# Patient Record
Sex: Male | Born: 1950 | Race: White | Hispanic: No | Marital: Married | Smoking: Never smoker
Health system: Southern US, Community
[De-identification: ages and names within clinical notes are randomized; demographics above are authoritative.]

## PROBLEM LIST (undated history)

## (undated) DIAGNOSIS — I10 Essential (primary) hypertension: Secondary | ICD-10-CM

## (undated) DIAGNOSIS — I2510 Atherosclerotic heart disease of native coronary artery without angina pectoris: Secondary | ICD-10-CM

## (undated) DIAGNOSIS — G20 Parkinson's disease: Secondary | ICD-10-CM

## (undated) DIAGNOSIS — G20A1 Parkinson's disease without dyskinesia, without mention of fluctuations: Secondary | ICD-10-CM

## (undated) HISTORY — DX: Atherosclerotic heart disease of native coronary artery without angina pectoris: I25.10

## (undated) HISTORY — PX: BACK SURGERY: SHX140

## (undated) HISTORY — PX: HERNIA REPAIR: SHX51

---

## 2000-11-17 ENCOUNTER — Emergency Department (HOSPITAL_COMMUNITY): Admission: EM | Admit: 2000-11-17 | Discharge: 2000-11-17 | Payer: Self-pay | Admitting: Emergency Medicine

## 2006-04-20 ENCOUNTER — Encounter: Admission: RE | Admit: 2006-04-20 | Discharge: 2006-04-20 | Payer: Self-pay | Admitting: Family Medicine

## 2006-04-20 IMAGING — CR DG CHEST 2V
2 series · 2 of 2 positions shown · non-contrast
Comparison: None.

CLINICAL DATA: Parkinson?s disease with right-sided chest pain for two months.
 CHEST - 2 VIEW:

[view not recorded (1 of 2)]
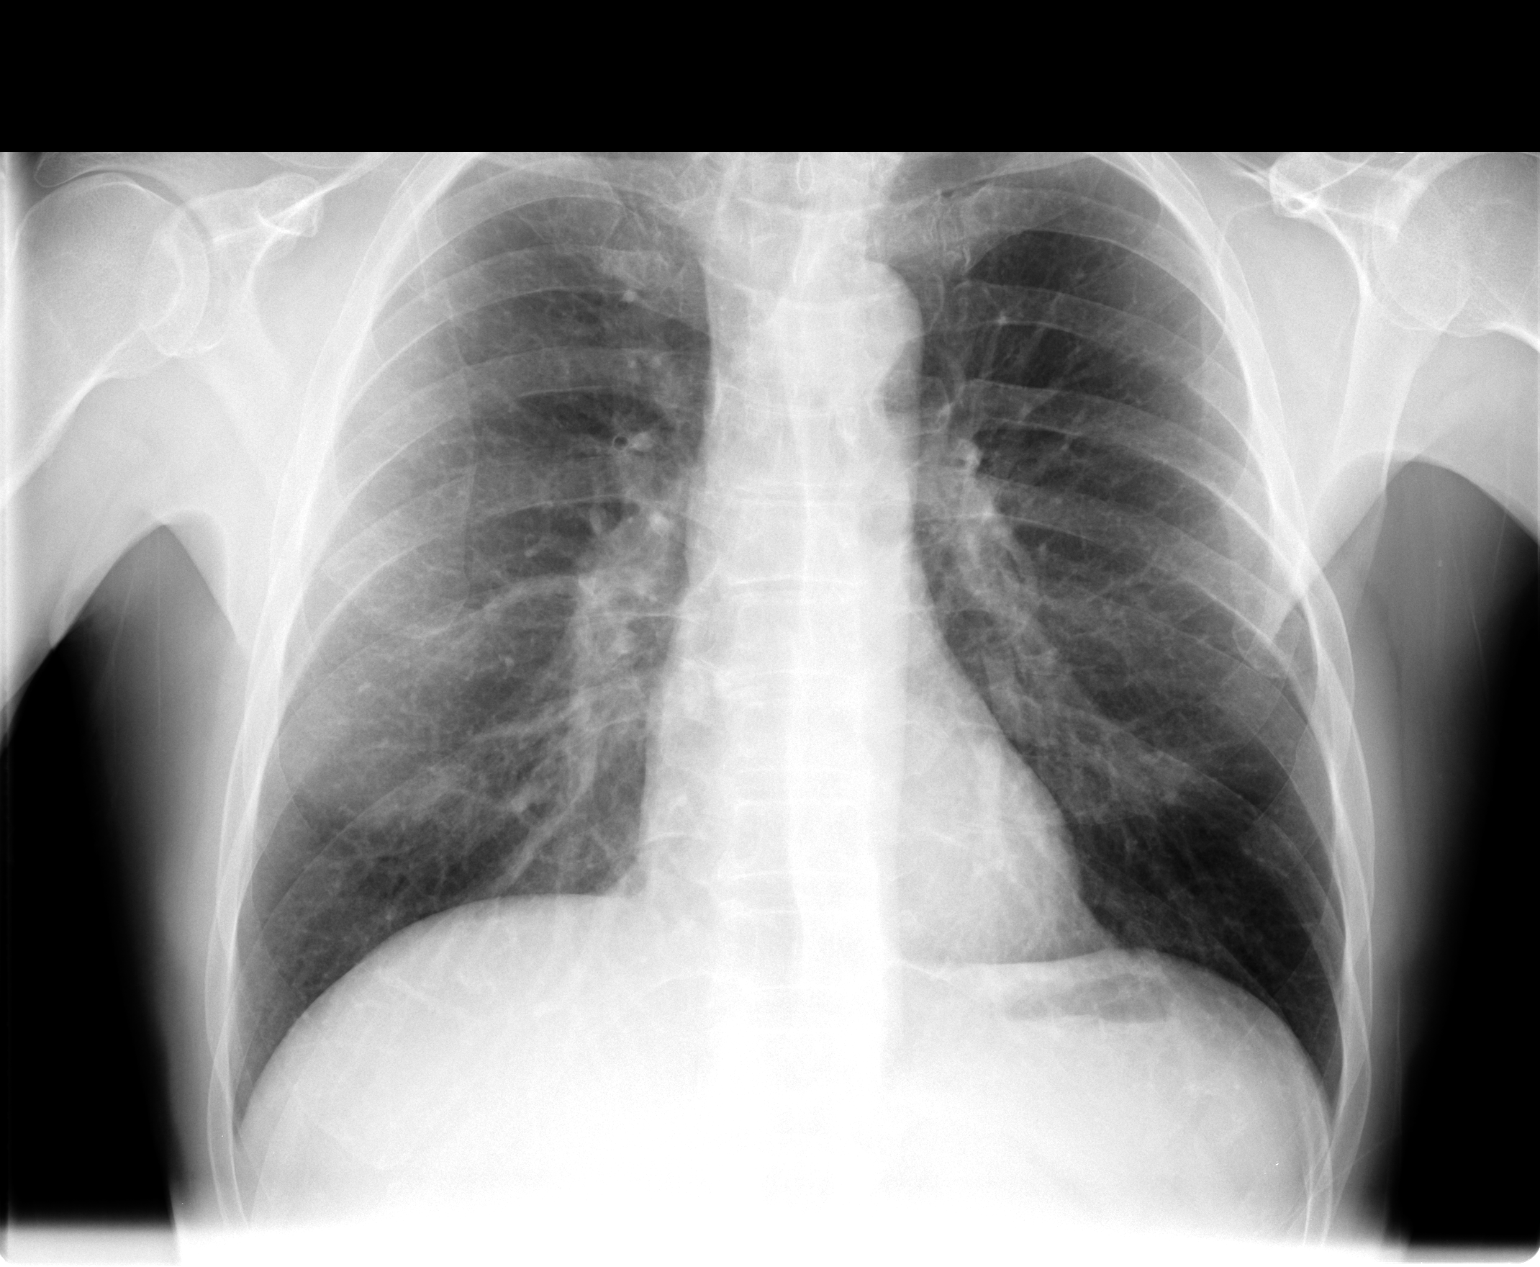

[view not recorded (2 of 2)]
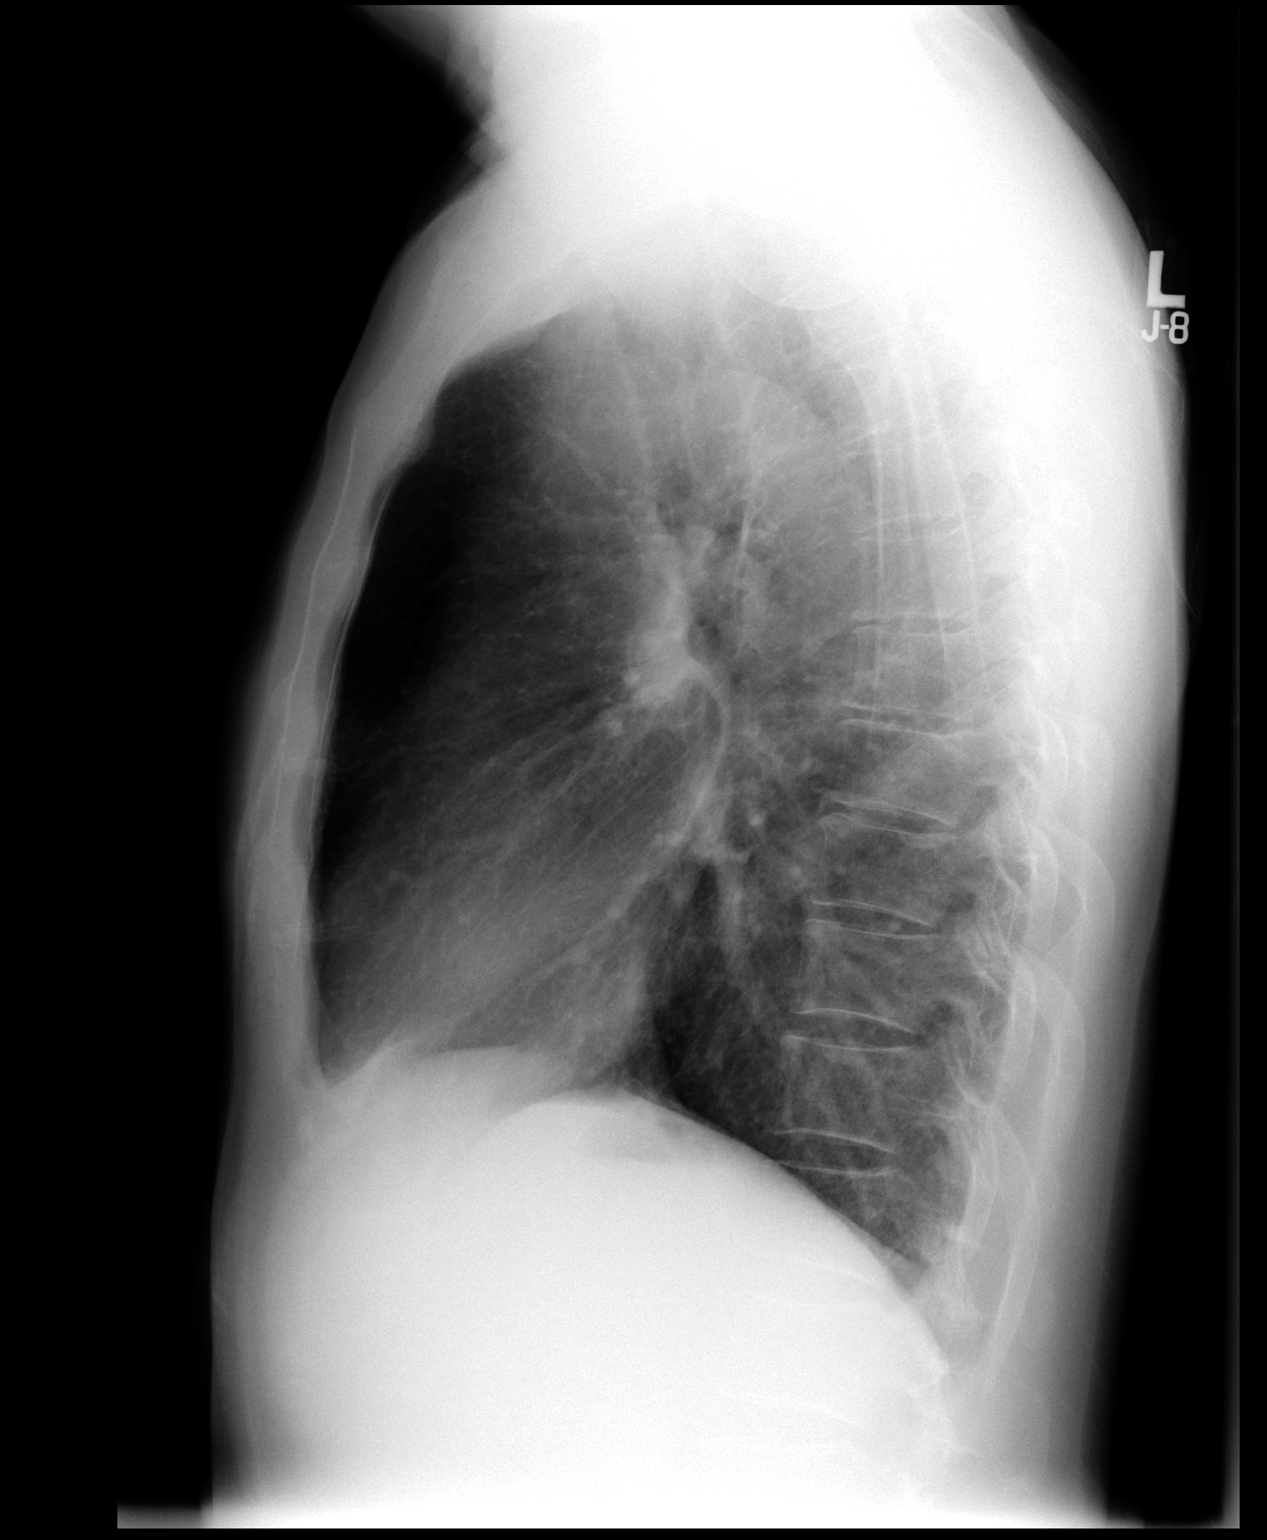

[2 of 2 positions shown; findings below may reference images not displayed]

FINDINGS: Midline trachea. The heart size is normal.   Mediastinal contours are within normal limits.  The costophrenic angles are sharp. The lungs are clear.
IMPRESSION: No acute cardiopulmonary disease.

## 2007-01-22 ENCOUNTER — Ambulatory Visit (HOSPITAL_BASED_OUTPATIENT_CLINIC_OR_DEPARTMENT_OTHER): Admission: RE | Admit: 2007-01-22 | Discharge: 2007-01-22 | Payer: Self-pay | Admitting: Surgery

## 2010-06-06 LAB — SURGICAL PCR SCREEN
MRSA, PCR: NEGATIVE
Staphylococcus aureus: NEGATIVE

## 2010-06-06 LAB — BASIC METABOLIC PANEL
BUN: 16 mg/dL (ref 6–23)
CO2: 29 mEq/L (ref 19–32)
Calcium: 9.8 mg/dL (ref 8.4–10.5)
Chloride: 105 mEq/L (ref 96–112)
Creatinine, Ser: 0.72 mg/dL (ref 0.4–1.5)
GFR calc Af Amer: >60 mL/min (ref >60)
GFR calc non Af Amer: >60 mL/min (ref >60)
Glucose, Bld: 99 mg/dL (ref 70–99)
Potassium: 3.9 mEq/L (ref 3.5–5.1)
Sodium: 141 mEq/L (ref 135–145)

## 2010-06-08 ENCOUNTER — Ambulatory Visit (HOSPITAL_COMMUNITY)
Admission: RE | Admit: 2010-06-08 | Discharge: 2010-06-08 | Payer: Self-pay | Source: Home / Self Care | Attending: Surgery | Admitting: Surgery

## 2010-06-17 NOTE — Op Note (Signed)
Jack Cobb, KISSNER              ACCOUNT NO.:  [id number]  MEDICAL RECORD NO.:  [id number]          PATIENT TYPE:  AMB  LOCATION:  DAY                          FACILITY:  [facility]  PHYSICIAN:  Thornton Park. Daphine Deutscher, MD  DATE OF BIRTH:  [birth year]  DATE OF PROCEDURE:  06/08/2010 DATE OF DISCHARGE:                              OPERATIVE REPORT   PREOPERATIVE DIAGNOSIS:  Recurrent right inguinal hernia.  POSTOPERATIVE DIAGNOSIS:  [place] recurrent hernia status post laparoscopic placement with C-QUR FX mesh.  Laparoscopic right inguinal hernia repair.  SURGEON:  Luretha Murphy, M.D.  ASSISTANT:  None.  ANESTHESIA:  General endotracheal.  DESCRIPTION OF PROCEDURE:  Angelos Wasco was taken to room 1, and given general anesthesia.  The abdomen was clipped and then prepped with PCMX and draped sterilely.  Previously, I had marked an X in the right groin, and this was visible.  We went below his umbilicus to the right, made a transverse incision.  I incised the rectus sheath transversely and retracted the muscle laterally.  I then dissected with my finger and then inserted the balloon dissector down to the pubis.  This blew up nicely preferentially and dissecting the right side.  Balloon was removed.  The preperitoneal space was then dissected and then expanded. Two 5-mm were placed in this preperitoneal space under direct vision.  I then began the dissection of the right inguinal region.  I could see one of my running Prolene sutures in the inguinal ligament, where the other mesh had been placed.  Anteriorly, there was a bulge, consistent with a direct hernia, and I think this is where the previously placed Ultra Pro II mesh had probably given way, as it was bulging that corresponded to the area in question.  However, I went ahead and wanted to make sure there was not a recurrent indirect or a new indirect as well.  In dissecting this, I found that there was.  I actually entered  the peritoneal space a little bit laterally.  I then swept all this down, but found the hernia sac and peeled it off the cord structures.  Using the aforementioned mesh, which enabled me to insert and tack with the secure strap absorbable tacker, I was able to come around the cord structures and secure the mesh to itself, so that the cord structures were completely reinforced.  Medially, this was tacked along the pubis and anteriorly, and I got a good repair, both around the direct hernia as well as around the cord structures.  There was really no bleeding.  Everything looked good.  I did put in a Veress needle to decompress the abdomen, because it did get a little of the gas that went into his peritoneal cavity through the rent in the indirect sac.  Wounds were injected with Marcaine and were closed with 4- 0 Vicryl.  Patient was taken to recovery room in satisfactory addition. He will be given some Percocet 5/325 to take for pain.     Thornton Park Daphine Deutscher, MD     MBM/MEDQ  D:  06/08/2010  T:  06/08/2010  Job:  [id number] Electronically Signed by  Luretha Murphy MD on 06/17/2010 08:30:39 PM

## 2010-10-04 NOTE — Op Note (Signed)
NAMEBECKHEM, ISADORE              ACCOUNT NO.:  [id number]   MEDICAL RECORD NO.:  [id number]          PATIENT TYPE:  AMB   LOCATION:  DSC                          FACILITY:  [facility]   PHYSICIAN:  Thornton Park. Daphine Deutscher, MD  DATE OF BIRTH:  10/11/1950   DATE OF PROCEDURE:  DATE OF DISCHARGE:                               OPERATIVE REPORT   PREOPERATIVE DIAGNOSIS:  Right inguinal hernia.   POSTOPERATIVE DIAGNOSIS:  Right direct inguinal hernia.   PROCEDURE:  Right inguinal hernia repair using Ultrapro mesh.   SURGEON:  Luretha Murphy.   ASSISTANT:  None.   ANESTHESIA:  General.   DESCRIPTION OF PROCEDURE:  Mr. Ditullio is a 55-year white male with a  soft bulge in his right inguinal region, easily reducible.  He was taken  to room 3 at [facility] and given general.  The abdomen was  clipped and prepped with Techni-Care and draped sterilely.  A small  oblique area incision was made and carried down to the external oblique  which were then incised.  Medial and lateral flaps were developed.  Ilioinguinal nerve branch was identified and reflected with the medial  flap.  I mobilized the cord and explored it proximally; did not find an  indirect hernia but found a fairly obvious big bulge in the floor.  I  cut this laxity, pushing the preperitoneal fat back, and then did a  Shouldice-type overlap repair with 2-0 Prolene.   Next, I cut a piece of Ultrapro mesh by [employer], which is a  polypropylene and a dissolvable component as well, and sutured that on  to the inguinal ligament with running 2-0 Prolene; medially to the  fascia with running 2-0 Prolene.  Then, I cut it and brought it around  the cord itself and tucked it beneath the external oblique.  I avoided  any impingement on the nerves and then closed the external oblique with  running 2-0 Vicryl.  4-0 Vicryl was used in the subcutaneous tissue and  subcuticular.  The skin was closed with Dermabond.  The patient seemed  to  tolerate the procedure well and was taken to recovery room in  satisfactory condition.  He will be given Tylox to take for pain and  will be followed up in the office 3-4 weeks.      Thornton Park Daphine Deutscher, MD  Electronically Signed     MBM/MEDQ  D:  01/22/2007  T:  01/22/2007  Job:  [id number]   cc:   Aida Puffer

## 2011-03-03 LAB — POCT HEMOGLOBIN-HEMACUE
Hemoglobin: 15.3
Operator id: 116011

## 2012-11-16 ENCOUNTER — Other Ambulatory Visit: Payer: Self-pay

## 2012-11-16 MED ORDER — PRAMIPEXOLE DIHYDROCHLORIDE ER 2.25 MG PO TB24: 2.2500 mg | ORAL_TABLET | Freq: Every day | ORAL | Status: DC

## 2012-11-16 NOTE — Telephone Encounter (Signed)
Former [facility] patient assigned to Dr Athar.  

## 2012-12-10 ENCOUNTER — Emergency Department (HOSPITAL_COMMUNITY)
Admission: EM | Admit: 2012-12-10 | Discharge: 2012-12-10 | Disposition: A | Discharge status: Home / Self Care | Payer: Medicare Other | Attending: Emergency Medicine | Admitting: Emergency Medicine

## 2012-12-10 ENCOUNTER — Encounter (HOSPITAL_COMMUNITY): Payer: Self-pay

## 2012-12-10 DIAGNOSIS — G20 Parkinson's disease: Secondary | ICD-10-CM | POA: Insufficient documentation

## 2012-12-10 DIAGNOSIS — G20A1 Parkinson's disease without dyskinesia, without mention of fluctuations: Secondary | ICD-10-CM | POA: Insufficient documentation

## 2012-12-10 DIAGNOSIS — S0501XA Injury of conjunctiva and corneal abrasion without foreign body, right eye, initial encounter: Secondary | ICD-10-CM

## 2012-12-10 DIAGNOSIS — Z79899 Other long term (current) drug therapy: Secondary | ICD-10-CM | POA: Insufficient documentation

## 2012-12-10 DIAGNOSIS — I10 Essential (primary) hypertension: Secondary | ICD-10-CM | POA: Insufficient documentation

## 2012-12-10 DIAGNOSIS — S058X9A Other injuries of unspecified eye and orbit, initial encounter: Secondary | ICD-10-CM | POA: Insufficient documentation

## 2012-12-10 DIAGNOSIS — Y9389 Activity, other specified: Secondary | ICD-10-CM | POA: Insufficient documentation

## 2012-12-10 DIAGNOSIS — Y929 Unspecified place or not applicable: Secondary | ICD-10-CM | POA: Insufficient documentation

## 2012-12-10 DIAGNOSIS — T1590XA Foreign body on external eye, part unspecified, unspecified eye, initial encounter: Secondary | ICD-10-CM | POA: Insufficient documentation

## 2012-12-10 HISTORY — DX: Parkinson's disease without dyskinesia, without mention of fluctuations: G20.A1

## 2012-12-10 HISTORY — DX: Essential (primary) hypertension: I10

## 2012-12-10 HISTORY — DX: Parkinson's disease: G20

## 2012-12-10 MED ORDER — NAPROXEN 500 MG PO TABS: 500.0000 mg | ORAL_TABLET | Freq: Two times a day (BID) | ORAL | Status: DC

## 2012-12-10 MED ORDER — CIPROFLOXACIN HCL 0.3 % OP SOLN: 1.0000 [drp] | OPHTHALMIC | Status: DC

## 2012-12-10 MED ORDER — OXYCODONE-ACETAMINOPHEN 5-325 MG PO TABS: 1.0000 | ORAL_TABLET | Freq: Three times a day (TID) | ORAL | Status: DC | PRN

## 2012-12-10 MED ORDER — TETRACAINE HCL 0.5 % OP SOLN
1.0000 [drp] | Freq: Once | OPHTHALMIC | Status: AC
  Administered 2012-12-10: 1 [drp] via OPHTHALMIC
  Filled 2012-12-10: qty 2

## 2012-12-10 MED ORDER — FLUORESCEIN SODIUM 1 MG OP STRP
1.0000 | ORAL_STRIP | Freq: Once | OPHTHALMIC | Status: AC
  Administered 2012-12-10: 1 via OPHTHALMIC
  Filled 2012-12-10: qty 1

## 2012-12-10 MED ORDER — OXYCODONE-ACETAMINOPHEN 5-325 MG PO TABS: 2.0000 | ORAL_TABLET | Freq: Three times a day (TID) | ORAL | Status: DC | PRN

## 2012-12-10 NOTE — ED Provider Notes (Signed)
History  This chart was scribed for Glade Nurse, PA-C working with Enid Skeens, MD by Greggory Stallion, ED scribe. This patient was seen in room TR04C/TR04C and the patient's care was started at 9:37 PM.  CSN: [id number] Arrival date & time 12/10/12  2111   Chief Complaint  Patient presents with  . Foreign Body in Eye   The history is provided by the patient. No language interpreter was used.    HPI Comments: Jack Cobb is a 62 y.o. male with PMHx of [place] and HTN who presents to the Emergency Department complaining right eye pain. Acute onset just a few hours ago when he was cutting wood with a band saw without safety glasses and had wood chips fly up into his right eye. Pt states he washed his eye out in the shower for about 20 minutes and then used eye drops with no relief. Pt describes pain as constant, throbbing, worsening from 2/10 to 5/10.  Admits redness and tearing, difficulty opening eye at times. Pt denies photophobia, painful EOM, visual disturbances, or loss of vision. Pt still appreciates foreign body sensation.   Past Medical History  Diagnosis Date  . Parkinson disease   . Hypertension    Past Surgical History  Procedure Laterality Date  . Hernia repair     History reviewed. No pertinent family history. History  Substance Use Topics  . Smoking status: Never Smoker   . Smokeless tobacco: Not on file  . Alcohol Use: No    Review of Systems  Constitutional: Negative for fever and diaphoresis.  HENT: Negative for neck pain and neck stiffness.   Eyes: Positive for pain, discharge (clear) and redness. Negative for visual disturbance.  Respiratory: Negative for apnea, chest tightness and shortness of breath.   Cardiovascular: Negative for chest pain and palpitations.  Gastrointestinal: Negative for nausea, vomiting, diarrhea and constipation.  Genitourinary: Negative for dysuria.  Musculoskeletal: Negative for gait problem.  Skin: Negative for rash.   Neurological: Negative for dizziness, weakness, light-headedness, numbness and headaches.    Allergies  Review of patient's allergies indicates no known allergies.  Home Medications   Current Outpatient Rx  Name  Route  Sig  Dispense  Refill  . Pramipexole Dihydrochloride (MIRAPEX ER) 2.25 MG TB24   Oral   Take 2.25 mg by mouth daily.   90 tablet   1    BP 139/75  Pulse 77  Temp(Src) 98.1 F (36.7 C) (Oral)  Resp 18  SpO2 97%  Physical Exam  Nursing note and vitals reviewed. Constitutional: He is oriented to person, place, and time. He appears well-developed and well-nourished. No distress.  HENT:  Head: Normocephalic and atraumatic.  Eyes: Conjunctivae and EOM are normal. Pupils are equal, round, and reactive to light. No foreign bodies found. Right eye exhibits discharge. No foreign body present in the right eye. Left eye exhibits discharge. No foreign body present in the left eye. Right conjunctiva is not injected. Left conjunctiva is not injected.  Slit lamp exam:      The right eye shows corneal abrasion and fluorescein uptake. The right eye shows no foreign body.    Fluorescein stain uptake at 3 o'clock and 7 o'clock on iris of right eye. Sclera injected. No foreign body visualized. Clear discharge/tearing. Visual Acuity  -  Bilateral Near:  20/50 ; R Near:  20/50 (pt states vision in right eye is blurred.) ; L Near:  20/50. Pt was given hand chart to read by nursing, pt  states he wears reading glasses which he did not have with him.   Neck: Normal range of motion. Neck supple.  No meningeal signs  Cardiovascular: Normal rate, regular rhythm and normal heart sounds.  Exam reveals no gallop and no friction rub.   No murmur heard. Pulmonary/Chest: Effort normal and breath sounds normal. No respiratory distress. He has no wheezes. He has no rales. He exhibits no tenderness.  Abdominal: Soft. Bowel sounds are normal. He exhibits no distension. There is no tenderness. There  is no rebound and no guarding.  Musculoskeletal: Normal range of motion. He exhibits no edema and no tenderness.  Neurological: He is alert and oriented to person, place, and time. No cranial nerve deficit.  Skin: Skin is warm and dry. He is not diaphoretic. No erythema.    ED Course  Procedures (including critical care time) DIAGNOSTIC STUDIES: Oxygen Saturation is 97% on RA, normal by my interpretation.    COORDINATION OF CARE: 9:52 PM-Discussed treatment plan which includes treatment for corneal abrasion with pt at bedside and pt agreed to plan. Advised pt he will be give antibiotics. Advised pt to follow up with an opthalmology.  Labs Reviewed - No data to display No results found. 1. Right corneal abrasion, initial encounter     MDM  Pt with corneal abrasion on PE. Eye irrigated w NS, no evidence of FB.  Difficult to assess visual acuity as pt does wear reading glasses but did not have them with him. Visual Acuity  -  Bilateral Near:  20/50 ; R Near:  20/50 (pt states vision in right eye is blurred.) ; L Near:  20/50.  Fluorescein stain uptake at 3 o'clock and 7 o'clock on iris of right eye. Sclera injected. No foreign body visualized. Clear discharge/tearing. Pt is not a contact lens wearer.  Exam non-concerning for orbital cellulitis, hyphema. Pt will be discharged home with ciloxan and pain meds.   Patient understands to follow up with ophthalmology, & to return to ER if new symptoms develop including change in vision, purulent drainage, or entrapment.   I personally performed the services described in this documentation, which was scribed in my presence. The recorded information has been reviewed and is accurate.    Glade Nurse, PA-C 12/10/12 2359

## 2012-12-10 NOTE — ED Notes (Addendum)
Pt reports he had wood chips fly up into his Right eye while cutting wood with a band saw at approx 1530 today. Pt can see out of eye, eye is red and draining, pt reports trying to flush his eye at home w/no relief, painful to open his eye

## 2012-12-12 ENCOUNTER — Ambulatory Visit (INDEPENDENT_AMBULATORY_CARE_PROVIDER_SITE_OTHER): Payer: Medicare Other | Admitting: Neurology

## 2012-12-12 ENCOUNTER — Encounter: Payer: Self-pay | Admitting: Neurology

## 2012-12-12 VITALS — BP 130/82 | HR 71 | Ht 70.0 in | Wt 153.0 lb

## 2012-12-12 DIAGNOSIS — G20 Parkinson's disease: Secondary | ICD-10-CM | POA: Insufficient documentation

## 2012-12-12 NOTE — Progress Notes (Signed)
Subjective:    Patient ID: Jack Cobb is a 62 y.o. male.  HPI  Interim history:   Jack Cobb is a very pleasant 62 year old right-handed gentleman who presents for followup consultation of his Parkinson's disease. He is unaccompanied today. He previously followed by Dr. Fayrene Fearing love and was last seen by him on 06/14/2012, and which time Dr. Sandria Manly felt that he was doing very well. He did not make any changes to his medication regimen. He has an underlying medical history of hypertension and chest pain with negative cardiological workup. He is status post hernia repair in 2012. He's currently on minocycline, Mirapex ER, carbidopa levodopa half a tablet 3 times a day and Diovan.   I reviewed Dr. 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 He exercises regularly. He has had some daytime somnolence. He snores at night. In February 2013 he went on disability. He denies compulsive behavior, orthostatic hypotension or edema. He has had some difficulty with his memory. His mother has dementia at age 80.  He lives alone, but takes care of his mother, who stays with him one week and with his only brother one week. He stays active and tends to sleep better when his mother stays with his brother as he can rest more and relax more. He has had more back pain and his R foot is stiffer, he feels a little more fatigued, but attributes this to the heat. He has a follow appointment with his cardiologist tomorrow. He had seen him one time for chest pain.   He denies depression, anxiety, hallucinations, delusions or major memory problems, or dyskinesias.   His Past Medical History Is Significant For: Past Medical History  Diagnosis Date  . Parkinson disease   . Hypertension      His Past Surgical History Is Significant For: Past Surgical History  Procedure Laterality Date  . Hernia repair      His Family History Is Significant For: No family history on file.  His Social History Is Significant For: History   Social History  . Marital Status: Single    Spouse Name: N/A    Number of Children: N/A  . Years of Education: N/A   Social History Main Topics  . Smoking status: Never Smoker   . Smokeless tobacco: None  . Alcohol Use: No  . Drug Use: No  . Sexually Active: None   Other Topics Concern  . None   Social History Narrative  . None    His Allergies Are:  No Known Allergies:   His Current Medications Are:  Outpatient Encounter Prescriptions as of 12/12/2012  Medication Sig Dispense Refill  . carbidopa-levodopa (SINEMET IR) 25-100 MG per tablet Take 1 tablet by mouth daily.      . Pramipexole Dihydrochloride (MIRAPEX ER) 2.25 MG TB24 Take 2.25 mg by mouth daily.  90 tablet  1  . rasagiline (AZILECT) 0.5 MG TABS Take 0.5 mg by mouth daily.      . valsartan (DIOVAN) 80 MG tablet Take 80 mg by mouth daily.      . [DISCONTINUED] ciprofloxacin (CILOXAN) 0.3 % ophthalmic solution Apply 1 drop to eye every 4 (four) hours. Place one drop in effected eye every 4 hours until follow up with opthalmologist  2.5 mL  1  . [DISCONTINUED] naproxen (  NAPROSYN) 500 MG tablet Take 1 tablet (500 mg total) by mouth 2 (two) times daily with a meal.  30 tablet  0  . [DISCONTINUED] oxyCODONE-acetaminophen (PERCOCET/ROXICET) 5-325 MG per tablet Take 1 tablet by mouth every 8 (eight) hours as needed for pain.  6 tablet  0   No facility-administered encounter medications on file as of 12/12/2012.  : Review of Systems  Constitutional: Positive for fatigue and unexpected weight change.    Objective:  Neurologic Exam  Physical Exam Physical Examination:   Filed Vitals:   12/12/12 0920  BP: 130/82  Pulse: 71    General Examination: The patient is a very  pleasant 62 y.o. male in no acute distress.  HEENT: Normocephalic, atraumatic, pupils are equal, round and reactive to light and accommodation. Funduscopic exam is normal with sharp disc margins noted. Extraocular tracking shows mild saccadic breakdown without nystagmus noted. There is limitation to upper gaze. There is mild decrease in eye blink rate. Hearing is intact. Tympanic membranes are clear bilaterally. Face is symmetric with mild facial masking and normal facial sensation. There is no lip, neck or jaw tremor. Neck is moderately rigid with intact passive ROM. There are no carotid bruits on auscultation. Oropharynx exam reveals mild mouth dryness. No significant airway crowding is noted. Mallampati is class II. Tongue protrudes centrally and palate elevates symmetrically.   There is no drooling.   Chest: is clear to auscultation without wheezing, rhonchi or crackles noted.  Heart: sounds are regular and normal without murmurs, rubs or gallops noted.   Abdomen: is soft, non-tender and non-distended with normal bowel sounds appreciated on auscultation.  Extremities: There is no pitting edema in the distal lower extremities bilaterally. Pedal pulses are intact.   Skin: is warm and dry with no trophic changes noted.    Musculoskeletal: exam reveals no obvious joint deformities, tenderness, joint swelling or erythema.  Neurologically:  Mental status: The patient is awake and alert, paying good  attention. He is able to completely provide the history. He is oriented to: person, place, time/date, situation, day of week, month of year and year. His memory, attention, language and knowledge are intact. There is no aphasia, agnosia, apraxia or anomia. There is noree of bradyphrenia. Speech is mildly hypophonic with no dysarthria noted. Mood is congruent and affect is normal.    Cranial nerves are as described above under HEENT exam. In addition, shoulder shrug is normal with equal shoulder height  noted.  Motor exam: Normal bulk, and strength for age is noted. There are no dyskinesias noted.   Tone is mildly rigid with presence of cogwheeling in the right upper extremity. There is overall mild bradykinesia. There is no drift or rebound.  There is no tremor.  Romberg is negative.  Reflexes are 1+ in the upper extremities and 1+ in the lower extremities.   Fine motor skills exam: Finger taps are moderately impaired on the right and mildly impaired on the left. Hand movements are moderately impaired on the right and mildly impaired on the left. RAP (rapid alternating patting) is moderately impaired on the right and mildly impaired on the left. Foot taps are moderately impaired on the right and moderately impaired on the left. Foot agility (in the form of heel stomping) is moderately impaired on the right and moderately impaired on the left.    Cerebellar testing shows no dysmetria or intention tremor on finger to nose testing. Heel to shin is unremarkable bilaterally. There is no truncal or gait ataxia.  Sensory exam is intact to light touch.  Gait, station and balance: He stands up from the seated position with minimal difficulty and does not need to push up with His hands. He needs no assistance. No veering to one side is noted. He is not noted to lean to the side. Posture is mildly stooped for age. Stance is narrow-based. He walks with decrease in stride length and pace and decreased arm swing on the right. He turns en bloc. Tandem walk is possible. Balance is not significantly impaired. He is able to do a toe but not a heel stance.       Assessment and Plan:   Assessment and Plan:  In summary, Jack Cobb is a very pleasant 62 y.o.-year old male with a history of Parkinson's disease, right-sided predominant. His physical exam is stable and has not progressed in the last 6 months. He is doing fairly well at this time and I reassured the patient in that regard.  I had a long chat  with the patient about His symptoms, my findings and the diagnosis of parkinsonism/Parksinson's disease, its prognosis and treatment options. We talked about medical treatments and non-pharmacological approaches. We talked about maintaining a healthy lifestyle in general. I encouraged the patient to eat healthy, exercise daily and keep well hydrated, to keep a scheduled bedtime and wake time routine, to not skip any meals and eat healthy snacks in between meals and to have protein with every meal. In particular, I stressed the importance of regular exercise, within of course the patient's own mobility limitations.   As far as further diagnostic testing is concerned, I suggested: no new test needed, and no medication change today. I talked to him about potentially starting coenzyme Q 10 up to 400 mg 3 times a day. I explained to him that this is over-the-counter. I also explained to him that there is a study suggesting a potential protective effect of this medication with respect to progression of Parkinson's disease but this is not an FDA approved medication. He understands this. He will look into it. We also talked about DBS surgery which I believe would be a good option for him down the Road. He is currently on a very low dose of levodopa and would like to stay on the same medication regimen and I explained the DBS surgery a little more time today. He did not need any refills today. I answered all his questions today and the patient was in agreement with the above outlined plan. I would like to see the patient back in 6 months, sooner if the need arises and encouraged him to call with any interim questions, concerns, problems or updates and refill requests.

## 2012-12-12 NOTE — Patient Instructions (Addendum)
I think overall you are doing fairly well and are stable at this point.   I do have some generic suggestions for you today:  Please make sure that you drink plenty of fluids. I would like for you to exercise daily for example in the form of walking 20-30 minutes every day, if you can. Please keep a regular sleep-wake schedule, keep regular meal times, do not skip any meals, eat  healthy snacks in between meals, such as fruit or nuts. Try to eat protein with every meal.   As far as your medications are concerned, I would like to suggest: no changes, but you may want to start taking Co-enzyme Q 10, 400 mg three times a day (total of 1200 mg per day). It is over the counter.   As far as diagnostic testing, I recommend: no new test.  I do not think we need to make any changes in your medications at this point. I think you're stable enough that I can see you back in 6 months, sooner if we need to. Please call us if you have any interim questions, concerns, or problems or updates to need to discuss.  Brett Canales is my clinical assistant and will answer any of your questions and relay your messages to me and will give you my messages.   Our phone number is [phone]. We also have an after hours call service for urgent matters and there is a physician on-call for urgent questions. For any emergencies you know to call 911 or go to the nearest emergency room.

## 2012-12-12 NOTE — ED Provider Notes (Addendum)
Medical screening examination/treatment/procedure(s) were performed by non-physician practitioner and as supervising physician I was immediately available for consultation/collaboration.    Enid Skeens, MD 12/12/12 0454  Enid Skeens, MD 01/06/13 2209

## 2013-06-16 ENCOUNTER — Ambulatory Visit (INDEPENDENT_AMBULATORY_CARE_PROVIDER_SITE_OTHER): Payer: Medicare Other | Admitting: Neurology

## 2013-06-16 ENCOUNTER — Encounter: Payer: Self-pay | Admitting: Neurology

## 2013-06-16 VITALS — BP 132/80 | HR 64 | Ht 70.0 in | Wt 165.0 lb

## 2013-06-16 DIAGNOSIS — G20 Parkinson's disease: Secondary | ICD-10-CM

## 2013-06-16 MED ORDER — RASAGILINE MESYLATE 1 MG PO TABS: 1.0000 mg | ORAL_TABLET | Freq: Every day | ORAL | Status: DC

## 2013-06-16 MED ORDER — MIRAPEX ER 2.25 MG PO TB24: 2.2500 mg | ORAL_TABLET | Freq: Every day | ORAL | Status: DC

## 2013-06-16 MED ORDER — CARBIDOPA-LEVODOPA 25-100 MG PO TABS: 0.5000 | ORAL_TABLET | Freq: Three times a day (TID) | ORAL | Status: DC

## 2013-06-16 NOTE — Progress Notes (Signed)
Subjective:    Cobb ID: Jack Cobb is a 63 y.o. male.  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 He is unaccompanied today. I first met him on 12/12/2012, at which time I felt that Jack Cobb exam was stable. I did not make any medication changes at Jack time but suggested he consider taking coenzyme Q 10.   Today, he reports feeling stable, with Jack exception that he has occasional start hesitation and trouble turning. Thankfully he has not fallen or had any other major balance problems. Otherwise, he has no new complaint, no recent illness and no changes to Jack Cobb BP medication. He uses a treadmill not daily.   He previously followed by Dr. Morene Antu and was last seen by him on 06/14/2012, and which time Dr. Erling Cruz felt that he was doing very well. He did not make any changes to Jack Cobb medication regimen.  Jack Cobb has an underlying medical history of hypertension and chest pain with negative cardiological workup in Jack past. He is status post hernia repair in 2012. I reviewed Dr. Tressia Danas prior notes and Jack Cobb's records and below is a summary of that review:  He was diagnosed with idiopathic right-sided predominant Parkinson's disease in May 2007 and initially treated with long-acting Mirapex, rasagiline and levodopa. He exercises regularly. He has had some daytime somnolence. He snores at night. In February 2013 he went on disability. He denied compulsive behavior, orthostatic hypotension or edema. He has had some difficulty with Jack Cobb memory. Jack Cobb mother has dementia at age 29. He lives alone, and takes care of Jack Cobb mother, who stays with him one week and with Jack Cobb only brother one week. He stays active and tends to sleep better when Jack Cobb mother stays with Jack Cobb  brother as he can rest more and relax more. He has had no depression, anxiety, hallucinations, delusions or major memory problems, or dyskinesias.    Jack Cobb Past Medical History Is Significant For: Past Medical History  Diagnosis Date  . Parkinson disease   . Hypertension     Jack Cobb Past Surgical History Is Significant For: Past Surgical History  Procedure Laterality Date  . Hernia repair      Jack Cobb Family History Is Significant For: Family History  Problem Relation Age of Onset  . Heart attack Father     Jack Cobb Social History Is Significant For: History   Social History  . Marital Status: Single    Spouse Name: N/A    Number of Children: 3  . Years of Education: 12th   Occupational History  . N/A    Social History Main Topics  . Smoking status: Never Smoker   . Smokeless tobacco: None  . Alcohol Use: No  . Drug Use: No  . Sexual Activity: None   Other Topics Concern  . None   Social History Narrative  . None    Jack Cobb Allergies Are:  No Known Allergies:   Jack Cobb Current Medications Are:  Outpatient Encounter Prescriptions as of 06/16/2013  Medication Sig  . carbidopa-levodopa (SINEMET IR) 25-100 MG per tablet Take 1 tablet by mouth daily.  . Pramipexole Dihydrochloride (MIRAPEX ER) 2.25 MG TB24 Take 2.25 mg by mouth daily.  . rasagiline (AZILECT) 0.5 MG TABS Take 1 mg by mouth daily.   . valsartan (DIOVAN)  80 MG tablet Take 80 mg by mouth daily.  :  Review of Systems:  Out of a complete 14 point review of systems, all are reviewed and negative with Jack exception of these symptoms as listed below:   Review of Systems  Unable to perform ROS   Objective:  Neurologic Exam  Physical Exam Physical Examination:   Filed Vitals:   06/16/13 1127  BP: 132/80  Pulse: 64    General Examination: Jack Cobb is a very pleasant 63 y.o. male in no acute distress.  HEENT: Normocephalic, atraumatic, pupils are equal, round and reactive to light and accommodation.  Funduscopic exam is normal with sharp disc margins noted. Extraocular tracking shows mild saccadic breakdown without nystagmus noted. There is limitation to upper gaze. There is mild decrease in eye blink rate. Hearing is intact. Face is symmetric with mild facial masking and normal facial sensation. There is no lip, neck or jaw tremor. Neck is moderately rigid with intact passive ROM. There is a slight left head tilt. There are no carotid bruits on auscultation. Oropharynx exam reveals mild mouth dryness. No significant airway crowding is noted. Mallampati is class II. Tongue protrudes centrally and palate elevates symmetrically.   There is no drooling.   Chest: is clear to auscultation without wheezing, rhonchi or crackles noted.  Heart: sounds are regular and normal without murmurs, rubs or gallops noted.   Abdomen: is soft, non-tender and non-distended with normal bowel sounds appreciated on auscultation.  Extremities: There is no pitting edema in Jack distal lower extremities bilaterally. Pedal pulses are intact.   Skin: is warm and dry with no trophic changes noted.    Musculoskeletal: exam reveals no obvious joint deformities, tenderness, joint swelling or erythema.  Neurologically:  Mental status: Jack Cobb is awake and alert, paying good  attention. He is able to completely provide Jack history. He is oriented to: person, place, time/date, situation, day of week, month of year and year. Jack Cobb memory, attention, language and knowledge are intact. There is no aphasia, agnosia, apraxia or anomia. There is noree of bradyphrenia. Speech is mildly hypophonic with no dysarthria noted. Mood is congruent and affect is normal.    Cranial nerves are as described above under HEENT exam. In addition, shoulder shrug is normal with equal shoulder height noted.  Motor exam: Normal bulk, and strength for age is noted. There are no dyskinesias noted.   Tone is mildly rigid with presence of cogwheeling in  Jack right upper extremity. There is overall mild bradykinesia. There is no drift or rebound.  There is no tremor.  Romberg is negative.  Reflexes are 1+ in Jack upper extremities and 1+ in Jack lower extremities.   Fine motor skills exam: Finger taps are moderately impaired on Jack right and mildly impaired on Jack left. Hand movements are moderately impaired on Jack right and mildly impaired on Jack left. RAP (rapid alternating patting) is moderately impaired on Jack right and mildly impaired on Jack left. Foot taps are moderately impaired on Jack right and moderately impaired on Jack left. Foot agility (in Jack form of heel stomping) is moderately impaired on Jack right and moderately impaired on Jack left.    Cerebellar testing shows no dysmetria or intention tremor on finger to nose testing. Heel to shin is unremarkable bilaterally. There is no truncal or gait ataxia.   Sensory exam is intact to light touch.  Gait, station and balance: He stands up from Jack seated position with minimal difficulty and does  not need to push up with Jack Cobb hands. He needs no assistance. No veering to one side is noted. He is not noted to lean to Jack side. Posture is mildly stooped for age. Stance is narrow-based. He walks with decrease in stride length and pace and decreased arm swing on Jack right. He turns en bloc. Tandem walk is possible. Balance is not significantly impaired. He is able to do a toe but not a heel stance.     Assessment and Plan:   In summary, Jack Cobb is a very pleasant 63 year old male with a history of Parkinson's disease, right-sided predominant, akinetic-rigid type. Jack Cobb physical exam is stable and he also indicates feeling stable. I think he's doing well at this time and I reassured Jack Cobb in that regard.  We again talked about maintaining a healthy lifestyle in general. I encouraged Jack Cobb to eat healthy, exercise daily and keep well hydrated, to keep a scheduled bedtime and wake time  routine, to not skip any meals and eat healthy snacks in between meals and to have protein with every meal. In particular, I stressed Jack importance of regular exercise, within of course Jack Cobb's own mobility limitations.   I would like for him to walk on a more regular basis. I think we can continue with Jack medication regimen that he is on at this time. He was in agreement. To that end, I renewed all of Jack Cobb prescriptions today. He does take brand-name Mirapex ER. I answered all Jack Cobb questions today and Jack Cobb was in agreement with Jack above outlined plan. I would like to see Jack Cobb back in 6 months, sooner if Jack need arises and encouraged him to call with any interim questions, concerns, problems or updates and refill requests.

## 2013-06-16 NOTE — Patient Instructions (Signed)
I think your Parkinson's disease has remained fairly stable, which is reassuring. Nevertheless, as you know, this disease does progress with time. It can affect your balance, your memory, your mood, your bowel and bladder function, your posture, balance and walking. Overall you are doing fairly well but I do want to suggest a few things today:  Remember to drink plenty of fluid, eat healthy meals and do not skip any meals. Try to eat protein with a every meal and eat a healthy snack such as fruit or nuts in between meals. Try to keep a regular sleep-wake schedule and try to exercise daily, particularly in the form of walking, 20-30 minutes a day, if you can.   Taking your medication on schedule is key.   Try to stay active physically and mentally. Engage in social activities in your community and with your family and try to keep up with current events by reading the newspaper or watching the news. Try to do word puzzles and you may like to do word puzzles and brain games on the computer such as on [email or web address].   As far as your medications are concerned, I would like to suggest that you take your current medication with the following additional changes: no changes needed today.    As far as diagnostic testing, I will order: no new test needed today.   I would like to see you back in 3 months, sooner if we need to. Please call us with any interim questions, concerns, problems, updates or refill requests.  Our nursing staff will answer any of your questions and relay your messages to me and also relay most of my messages to you.  Our phone number is [phone]. We also have an after hours call service for urgent matters and there is a physician on-call for urgent questions, that cannot wait till the next work day. For any emergencies you know to call 911 or go to the nearest emergency room.

## 2013-11-26 ENCOUNTER — Encounter: Payer: Self-pay | Admitting: Neurology

## 2013-12-04 ENCOUNTER — Telehealth: Payer: Self-pay | Admitting: Neurology

## 2013-12-04 NOTE — Telephone Encounter (Signed)
I called the patient back.  Got no answer Left message with info for [facility] [phone] which aides patient with getting meds while they are in the donut hole.

## 2013-12-15 ENCOUNTER — Ambulatory Visit: Payer: Medicare Other | Admitting: Neurology

## 2014-01-05 ENCOUNTER — Ambulatory Visit (INDEPENDENT_AMBULATORY_CARE_PROVIDER_SITE_OTHER): Payer: Medicare Other | Admitting: Neurology

## 2014-01-05 ENCOUNTER — Encounter: Payer: Self-pay | Admitting: Neurology

## 2014-01-05 VITALS — BP 119/67 | HR 62 | Temp 98.1°F | Ht 70.0 in | Wt 154.0 lb

## 2014-01-05 DIAGNOSIS — G20 Parkinson's disease: Secondary | ICD-10-CM

## 2014-01-05 DIAGNOSIS — G20A1 Parkinson's disease without dyskinesia, without mention of fluctuations: Secondary | ICD-10-CM

## 2014-01-05 MED ORDER — MIRAPEX ER 2.25 MG PO TB24: 2.2500 mg | ORAL_TABLET | Freq: Every day | ORAL | Status: DC

## 2014-01-05 MED ORDER — RASAGILINE MESYLATE 1 MG PO TABS: 1.0000 mg | ORAL_TABLET | Freq: Every day | ORAL | Status: DC

## 2014-01-05 MED ORDER — CARBIDOPA-LEVODOPA 25-100 MG PO TABS: 0.5000 | ORAL_TABLET | Freq: Four times a day (QID) | ORAL | Status: DC

## 2014-01-05 NOTE — Progress Notes (Signed)
Subjective:    Patient ID: Jack Cobb is a 63 y.o. male.  HPI    Interim history:   Mr. Jack Cobb is a very pleasant 63 year old right-handed gentleman, with an underlying medical history of hypertension and chest pain, status post hernia repair in 2012, who presents for followup consultation of his right-sided predominant Parkinson's disease, which was diagnosed in May 2007. He is unaccompanied today. I last saw him on 06/16/2013, at which time he reported feeling stable, with the exception of occasional start hesitation and trouble turning. I encouraged him to increase his exercise to more daily walking exercises and kept his medications the same.  Today, he reports doing "so-so", as he feels, that his posture is worse and his walking is slow and he feels stable with his mood and memory. He is stable with mild constipation. He used to be a Print production planner. He is still very active. He does use a tractor, a chain saw and knows to be extra careful. No issues with driving, but does no like to drive at night. He mother stays with every other week, sharing care wit his brother. She is 95 and has dementia.   I first met him on 12/12/2012, at which time I felt that his exam was stable. I did not make any medication changes at the time but suggested he consider taking coenzyme Q 10.  He previously followed by Dr. Morene Antu and was last seen by him on 06/14/2012, and which time Dr. Erling Cruz felt that he was doing very well. He did not make any changes to his medication regimen.  He was diagnosed with idiopathic right-sided predominant Parkinson's disease in May 2007 and initially treated with long-acting Mirapex, rasagiline and levodopa. He exercises regularly. He has had some daytime somnolence. He snores at night. In February 2013 he went on disability. He denied compulsive behavior, orthostatic hypotension or edema. He has had some difficulty with his memory. His mother has dementia at age 25. He lives  alone, and takes care of his mother, who stays with him one week and with his only brother one week. He stays active and tends to sleep better when his mother stays with his brother as he can rest more and relax more. He has had no depression, anxiety, hallucinations, delusions or major memory problems, or dyskinesias.   His Past Medical History Is Significant For: Past Medical History  Diagnosis Date  . Parkinson disease   . Hypertension     His Past Surgical History Is Significant For: Past Surgical History  Procedure Laterality Date  . Hernia repair      His Family History Is Significant For: Family History  Problem Relation Age of Onset  . Heart attack Father     His Social History Is Significant For: History   Social History  . Marital Status: Single    Spouse Name: N/A    Number of Children: 3  . Years of Education: 12th   Occupational History  . N/A     retired   Social History Main Topics  . Smoking status: Never Smoker   . Smokeless tobacco: Former Systems developer    Types: Chew     Comment: quit 8 years ago  . Alcohol Use: No  . Drug Use: No  . Sexual Activity: None   Other Topics Concern  . None   Social History Narrative   Patient is right handed, resides alone, mother resides with him 1/2 the time,has dementia    His  Allergies Are:  No Known Allergies:   His Current Medications Are:  Outpatient Encounter Prescriptions as of 01/05/2014  Medication Sig  . carbidopa-levodopa (SINEMET IR) 25-100 MG per tablet Take 0.5 tablets by mouth 3 (three) times daily.  Marland Kitchen MIRAPEX ER 2.25 MG TB24 Take 2.25 mg by mouth daily.  . rasagiline (AZILECT) 1 MG TABS tablet Take 1 tablet (1 mg total) by mouth daily.  . valsartan (DIOVAN) 80 MG tablet Take 80 mg by mouth daily.  :  Review of Systems:  Out of a complete 14 point review of systems, all are reviewed and negative with the exception of these symptoms as listed below:   Review of Systems  HENT: Positive for trouble  swallowing.   Musculoskeletal: Positive for back pain and gait problem.  Neurological: Positive for speech difficulty and weakness.    Objective:  Neurologic Exam  Physical Exam Physical Examination:   Filed Vitals:   01/05/14 1114  BP: 119/67  Pulse: 62  Temp: 98.1 F (36.7 C)   General Examination: The patient is a very pleasant 63 y.o. male in no acute distress.  HEENT: Normocephalic, atraumatic, pupils are equal, round and reactive to light and accommodation. Funduscopic exam is normal with sharp disc margins noted. Extraocular tracking shows mild saccadic breakdown without nystagmus noted. There is limitation to upper gaze. There is mild decrease in eye blink rate. Hearing is intact. Face is symmetric with mild facial masking and normal facial sensation. There is no lip, neck or jaw tremor. Neck is moderately rigid with intact passive ROM. There is a slight left head tilt. There are no carotid bruits on auscultation. Oropharynx exam reveals mild mouth dryness. No significant airway crowding is noted. Mallampati is class II. Tongue protrudes centrally and palate elevates symmetrically.   There is no drooling.   Chest: is clear to auscultation without wheezing, rhonchi or crackles noted.  Heart: sounds are regular and normal without murmurs, rubs or gallops noted.   Abdomen: is soft, non-tender and non-distended with normal bowel sounds appreciated on auscultation.  Extremities: There is no pitting edema in the distal lower extremities bilaterally. Pedal pulses are intact.   Skin: is warm and dry with no trophic changes noted.    Musculoskeletal: exam reveals no obvious joint deformities, tenderness, joint swelling or erythema.  Neurologically:  Mental status: The patient is awake and alert, paying good  attention. He is able to completely provide the history. He is oriented to: person, place, time/date, situation, day of week, month of year and year. His memory, attention,  language and knowledge are intact. There is no aphasia, agnosia, apraxia or anomia. There is noree of bradyphrenia. Speech is mildly hypophonic with no dysarthria noted. Mood is congruent and affect is normal.    Cranial nerves are as described above under HEENT exam. In addition, shoulder shrug is normal with equal shoulder height noted.  Motor exam: Normal bulk, and strength for age is noted. There are no dyskinesias noted.   Tone is mildly rigid with presence of cogwheeling in the right upper extremity. There is overall mild bradykinesia. There is no drift or rebound.  There is no tremor.  Romberg is negative.  Reflexes are 1+ in the upper extremities and 1+ in the lower extremities.   Fine motor skills exam: Finger taps are moderately impaired on the right and mildly impaired on the left. Hand movements are moderately impaired on the right and mildly impaired on the left. RAP (rapid alternating patting) is moderately  impaired on the right and mildly impaired on the left. Foot taps are moderately impaired on the right and moderately impaired on the left. Foot agility (in the form of heel stomping) is moderately impaired on the right and moderately impaired on the left.    Cerebellar testing shows no dysmetria or intention tremor on finger to nose testing. Heel to shin is unremarkable bilaterally. There is no truncal or gait ataxia.   Sensory exam is intact to light touch.  Gait, station and balance: He stands up from the seated position with minimal difficulty and does not need to push up with His hands. He needs no assistance. No veering to one side is noted. He is not noted to lean to the side. Posture is mild to moderately stooped for age, slightly worse. Stance is narrow-based. He walks with decrease in stride length and pace and decreased arm swing on the right. He turns en bloc. Tandem walk is possible with mild difficulty. Balance is not significantly impaired. He is able to do a toe but not  a heel stance.     Assessment and Plan:   In summary, HUTSON LUFT is a very pleasant 63 year old male with a history of Parkinson's disease, right-sided predominant, akinetic-rigid type. His physical exam is stable for the most part with the exception of a slight change in his posture. He also indicates that his walking is a little bit more slower than what he is used to. But overall he would agree that he has remained fairly stable. I am pleased with how he is doing. I did suggest that he had a fourth half pill of levodopa later in the afternoon or in the evening. To that end I suggested he take half a pill of levodopa at 7 AM, 11 AM, 3 PM and 7 PM. We will keep the Azilect and Mirapex long-acting the same. I renewed all his prescriptions for 90 day supplies.  We again talked about maintaining a healthy lifestyle in general. I encouraged the patient to eat healthy, exercise daily and keep well hydrated, to keep a scheduled bedtime and wake time routine, to not skip any meals and eat healthy snacks in between meals and to have protein with every meal. In particular, I stressed the importance of regular exercise, within of course the patient's own mobility limitations.   I would like for him to walk on a more regular basis. He was in agreement. He takes brand-name Mirapex ER. I answered all his questions today and the patient was in agreement with the above outlined plan. I would like to see the patient back in 6 months, sooner if the need arises and encouraged him to call with any interim questions, concerns, problems or updates and refill requests.

## 2014-01-05 NOTE — Patient Instructions (Signed)
Let's try an additional 1/2 pill of levodopa: Therefore, take 1/2 pill at 7 AM, 11 AM, 3 PM and 7 PM.

## 2014-07-08 ENCOUNTER — Ambulatory Visit (INDEPENDENT_AMBULATORY_CARE_PROVIDER_SITE_OTHER): Payer: BLUE CROSS/BLUE SHIELD | Admitting: Neurology

## 2014-07-08 ENCOUNTER — Encounter: Payer: Self-pay | Admitting: Neurology

## 2014-07-08 VITALS — BP 134/80 | HR 78 | Temp 97.3°F | Ht 70.0 in | Wt 163.0 lb

## 2014-07-08 DIAGNOSIS — G20 Parkinson's disease: Secondary | ICD-10-CM

## 2014-07-08 MED ORDER — CARBIDOPA-LEVODOPA 25-100 MG PO TABS: ORAL_TABLET | ORAL | Status: DC

## 2014-07-08 NOTE — Patient Instructions (Addendum)
I think your Parkinson's disease has remained fairly stable, which is reassuring. Nevertheless, as you know, this disease does progress with time. It can affect your balance, your memory, your mood, your bowel and bladder function, your posture, balance and walking. Overall you are doing fairly well but I do want to suggest a few things today:  Remember to drink plenty of fluid, eat healthy meals and do not skip any meals. Try to eat protein with a every meal and eat a healthy snack such as fruit or nuts in between meals. Try to keep a regular sleep-wake schedule and try to exercise daily, particularly in the form of walking, 20-30 minutes a day, if you can.   Taking your medication on schedule is key.   Try to stay active physically and mentally. Engage in social activities in your community and with your family and try to keep up with current events by reading the newspaper or watching the news. Try to do word puzzles and you may like to do word puzzles and brain games on the computer such as on [email or web address].   As far as your medications are concerned, I would like to suggest that you take your current medication with the following additional changes:  Keep Azilect and Mirapex ER the same and for Sinemet we will do: 1/2 pill at 7 AM, 1/2 pill at 11 AM, 1 pill at 3 PM and 1/2 pill at 7 PM.    I would like to see you back in 6 months, sooner if we need to. Please call us with any interim questions, concerns, problems, updates or refill requests.  Please also call us for any test results so we can go over those with you on the phone. Our nursing staff will answer any of your questions and relay your messages to me and also relay most of my messages to you.  Our phone number is [phone]. We also have an after hours call service for urgent matters and there is a physician on-call for urgent questions, that cannot wait till the next work day. For any emergencies you know to call 911 or go to the  nearest emergency room.

## 2014-07-08 NOTE — Progress Notes (Signed)
Subjective:    Patient ID: Jack Cobb is a 64 y.o. male.  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 He is unaccompanied today. I last saw him on 01/05/2014, at which time he reported feeling worse with his posture and his walking was slower. He felt stable with his mood and memory. He had mild constipation. He continued to work here and there. He reported no issues with driving. His mother continue to stay with him every other week and he was sharing care of her with his brother. His mother has dementia and is in her mid 35s. I suggested he continue with Azilect and Mirapex ER at the same doses but and a half pill of levodopa to his regimen.  Today, he reports noticing more tremors lately, especially at the end of the day when he is trying to do fine motor skills, he does not have much in the way of resting tremor otherwise. This is more of a action tremor. He has more fatigue at the end of the day. His mornings are actually better than later in the day. By 3 PM he feels quite slow and stiff. He has at times take a whole pill of Sinemet at the time. He continues to take Azilect and Mirapex brand-name long-acting. He is consistently taking Sinemet at half a pill 3 times a day. Sometimes he does not take his evening dose. He is somewhat consistent with the dose timings. He is very active and continues to take care of his mother who will be turning 65. She is with her other son this week.   I saw him on 06/16/2013, at which time he reported feeling stable, with the exception of occasional start hesitation and trouble turning. I encouraged him to increase his exercise to more daily walking exercises and kept his medications the  same.  I first met him on 12/12/2012, at which time I felt that his exam was stable. I did not make any medication changes at the time but suggested he consider taking coenzyme Q 10.    He previously followed by Dr. Morene Antu and was last seen by him on 06/14/2012, and which time Dr. Erling Cruz felt that he was doing very well. He did not make any changes to his medication regimen.   He was diagnosed with idiopathic right-sided predominant Parkinson's disease in May 2007 and initially treated with long-acting Mirapex, rasagiline and levodopa. He exercises regularly. He has had some daytime somnolence. He snores at night. In February 2013 he went on disability. He denied compulsive behavior, orthostatic hypotension or edema. He has had some difficulty with his memory. His mother has dementia at age 18. He lives alone, and takes care of his mother, who stays with him one week and with his only brother one week. He stays active and tends to sleep better when his mother stays with his brother as he can rest more and relax more. He has had no depression, anxiety, hallucinations, delusions or major memory problems, or dyskinesias.   His Past Medical History Is Significant For: Past Medical History  Diagnosis Date  . Parkinson disease   . Hypertension     His Past Surgical History Is Significant For: Past Surgical History  Procedure Laterality Date  .  Hernia repair      His Family History Is Significant For: Family History  Problem Relation Age of Onset  . Heart attack Father     His Social History Is Significant For: History   Social History  . Marital Status: Single    Spouse Name: N/A  . Number of Children: 3  . Years of Education: 12th   Occupational History  . N/A     retired   Social History Main Topics  . Smoking status: Never Smoker   . Smokeless tobacco: Former Systems developer    Types: Chew     Comment: quit 8 years ago  . Alcohol Use: No  . Drug Use: No  . Sexual Activity: Not on  file   Other Topics Concern  . None   Social History Narrative   Patient is right handed, resides alone, mother resides with him 1/2 the time,has dementia    His Allergies Are:  No Known Allergies:   His Current Medications Are:  Outpatient Encounter Prescriptions as of 07/08/2014  Medication Sig  . carbidopa-levodopa (SINEMET IR) 25-100 MG per tablet Take 0.5 tablets by mouth 4 (four) times daily.  Marland Kitchen MIRAPEX ER 2.25 MG TB24 Take 2.25 mg by mouth daily.  . rasagiline (AZILECT) 1 MG TABS tablet Take 1 tablet (1 mg total) by mouth daily.  . [DISCONTINUED] valsartan (DIOVAN) 80 MG tablet Take 80 mg by mouth daily.  :  Review of Systems:  Out of a complete 14 point review of systems, all are reviewed and negative with the exception of these symptoms as listed below:   Review of Systems  Neurological:       Beginning tremors in both hands    Objective:  Neurologic Exam  Physical Exam Physical Examination:   Filed Vitals:   07/08/14 1131  BP: 134/80  Pulse: 78  Temp: 97.3 F (36.3 C)   General Examination: The patient is a very pleasant 64 y.o. male in no acute distress.  HEENT: Normocephalic, atraumatic, pupils are equal, round and reactive to light and accommodation. Funduscopic exam is normal with sharp disc margins noted. Extraocular tracking shows mild saccadic breakdown without nystagmus noted. There is limitation to upper gaze. There is mild decrease in eye blink rate. Hearing is intact. Face is symmetric with mild facial masking and normal facial sensation. There is no lip, neck or jaw tremor. Neck is moderately rigid with intact passive ROM. There is a slight left head tilt. There are no carotid bruits on auscultation. Oropharynx exam reveals mild mouth dryness. No significant airway crowding is noted. Mallampati is class II. Tongue protrudes centrally and palate elevates symmetrically.   There is no drooling.   Chest: is clear to auscultation without wheezing,  rhonchi or crackles noted.  Heart: sounds are regular and normal without murmurs, rubs or gallops noted.   Abdomen: is soft, non-tender and non-distended with normal bowel sounds appreciated on auscultation.  Extremities: There is no pitting edema in the distal lower extremities bilaterally. Pedal pulses are intact.   Skin: is warm and dry with no trophic changes noted.    Musculoskeletal: exam reveals no obvious joint deformities, tenderness, joint swelling or erythema.  Neurologically:  Mental status: The patient is awake and alert, paying good  attention. He is able to completely provide the history. He is oriented to: person, place, time/date, situation, day of week, month of year and year. His memory, attention, language and knowledge are intact. There is no aphasia, agnosia, apraxia or  anomia. There is noree of bradyphrenia. Speech is mildly hypophonic with no dysarthria noted. Mood is congruent and affect is normal.    Cranial nerves are as described above under HEENT exam. In addition, shoulder shrug is normal but L shoulder is slightly higher.   Motor exam: Normal bulk, and strength for age is noted. There are no dyskinesias noted.   Tone is mildly rigid with presence of cogwheeling in the right upper extremity. There is overall mild bradykinesia. There is no drift or rebound.  There is no tremor.  Romberg is negative.  Reflexes are 1+ in the upper extremities and 1+ in the lower extremities.   Fine motor skills exam: Finger taps are moderately to severely impaired on the right and mild to moderately impaired on the left. Hand movements are moderately impaired on the right and mildly impaired on the left. RAP (rapid alternating patting) is moderately impaired on the right and mildly impaired on the left. Foot taps are moderately impaired on the right and moderately impaired on the left. Foot agility (in the form of heel stomping) is moderately impaired on the right and moderately  impaired on the left.    Cerebellar testing shows no dysmetria or intention tremor on finger to nose testing. Heel to shin is unremarkable bilaterally. There is no truncal or gait ataxia.   Sensory exam is intact to light touch, temp, and vibration in the UEs and LEs.  Gait, station and balance: He stands up from the seated position with minimal difficulty and does not need to push up with His hands. He needs no assistance. No veering to one side is noted. He is not noted to lean to the side. Posture is mild to moderately stooped for age, slightly worse. Stance is narrow-based. He walks with decrease in stride length and good pace and decreased arm swing on the right. He turns en bloc. Tandem walk is possible with mild difficulty. Balance is not significantly impaired.  Assessment and Plan:   In summary, DONTAVIUS KEIM is a very pleasant 64 year old male with a history of Parkinson's disease, right-sided predominant, akinetic-rigid type, with symptoms and diagnosis going back to 2007. His physical exam has remained stable for quite some time but he has had mild progression over the last couple of years. He has more difficulty with fine motor skills on the right side today. I did not detect any tremors. He has not been consistently taking levodopa at half pill 4 times a day and I reinforced this. I would like for him to try to maintain a 7 AM, 11 AM, 3 PM and 7 PM schedule. I would like to continue with Azilect 1 mg once daily and Mirapex ER 2.25 mg once daily. He did not need refills on those medicines at this time. I would like to suggest a mild increase in Sinemet to half a pill at 7 AM, 11 AM, 1 full pill at 3 PM and half pill at 7 PM. I adjusted his prescription for this and [facility] as [facility]. I asked him to try to drink enough fluid. I asked him to continue to stay active mentally and physically. I would like to see him back in 6 months, sooner if the need arises. I  answered all his questions today and he was in agreement.

## 2014-11-30 ENCOUNTER — Telehealth: Payer: Self-pay | Admitting: Neurology

## 2014-11-30 MED ORDER — RASAGILINE MESYLATE 1 MG PO TABS: 1.0000 mg | ORAL_TABLET | Freq: Every day | ORAL | Status: DC

## 2014-11-30 NOTE — Telephone Encounter (Signed)
Patient is calling to get a refill for Rx Axilect 1 mg.  Please call.

## 2014-11-30 NOTE — Telephone Encounter (Signed)
Rx has been sent.  I called back to advise.  He is aware.  Says he is going to try and get future fills from [facility], and they will contact us when Rx is needed.

## 2014-12-04 ENCOUNTER — Telehealth: Payer: Self-pay | Admitting: Neurology

## 2014-12-04 NOTE — Telephone Encounter (Signed)
It does not appear [facility] is in our system.  I called back and spoke with patient.  He is aware Rx will be sent manually.

## 2014-12-04 NOTE — Telephone Encounter (Signed)
Patient is calling to get a Rx faxed to [facility] [phone] for medication rasagiline (AZILECT) 1 MG TABS tablet. The telephone number is [phone]. Thank you.

## 2014-12-04 NOTE — Telephone Encounter (Signed)
Patient is calling to get a written Rx for

## 2014-12-08 ENCOUNTER — Other Ambulatory Visit: Payer: Self-pay

## 2014-12-08 MED ORDER — RASAGILINE MESYLATE 1 MG PO TABS: 1.0000 mg | ORAL_TABLET | Freq: Every day | ORAL | Status: DC

## 2014-12-14 ENCOUNTER — Telehealth: Payer: Self-pay | Admitting: Neurology

## 2014-12-14 NOTE — Telephone Encounter (Signed)
Lafonda Mosses already faxed this Rx to [facility] last week.  I called the patient back.  He said no written Rx is needed.  He will follow up with [facility] and call back if anything further is needed.

## 2014-12-14 NOTE — Telephone Encounter (Signed)
Patient called and requested to come by the office and pick this rx up this afternoon. Please call and advise.

## 2015-01-06 ENCOUNTER — Ambulatory Visit (INDEPENDENT_AMBULATORY_CARE_PROVIDER_SITE_OTHER): Payer: Medicare Other | Admitting: Neurology

## 2015-01-06 ENCOUNTER — Encounter: Payer: Self-pay | Admitting: Neurology

## 2015-01-06 ENCOUNTER — Ambulatory Visit: Payer: BLUE CROSS/BLUE SHIELD | Admitting: Neurology

## 2015-01-06 VITALS — BP 124/75 | HR 59 | Resp 14 | Ht 70.0 in | Wt 152.0 lb

## 2015-01-06 DIAGNOSIS — G20 Parkinson's disease: Secondary | ICD-10-CM

## 2015-01-06 MED ORDER — PRAMIPEXOLE DIHYDROCHLORIDE ER 3 MG PO TB24: 3.0000 mg | ORAL_TABLET | Freq: Every day | ORAL | Status: DC

## 2015-01-06 NOTE — Progress Notes (Signed)
Subjective:    Patient ID: Jack Cobb is a 64 y.o. male.  HPI     Interim history:   Mr. 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 He is unaccompanied today. I last saw him on 07/08/2014, at which time he reported more tremors, especially towards the end of the day. He reported more fatigue, and stiffness and slowness after 3 PM. He was taking Azilect once daily and brand-name Mirapex long-acting. He was taking Sinemet half a pill 3 times a day. He continued to be very active and take care of his elderly mother, 43 years old, alternating her care with his brother. I suggested he continue with Azilect once daily and Mirapex ER but he increase Sinemet to half a pill at 7 AM, half a pill at 11 AM, 1 whole pill at 3 PM and half a pill at 7 PM.    Today, 01/06/2015: He reports doing fairly well overall. He stays active. He has noted more stiffness particularly first thing in the morning and in the evenings. He does not always take his evening dose of Sinemet he admits. He is somewhat afraid of getting addicted to it or taking too much. He is fearful of the potential long-term effects and side effects of levodopa as well. He continues to tolerate his medications. He has in particular no significant concerns or complaints regarding his mood, his memory, his balance, but has noticed worsening posture and stiffness and occasional tremors in his right hand.  Previously:   I saw him on 01/05/2014, at which time he reported feeling worse with his posture and his walking was slower. He felt stable with his mood and memory. He had mild constipation. He continued to work here and there. He reported no issues with driving. His mother continue to stay with him every other week  and he was sharing care of her with his brother. His mother has dementia and is in her mid 60s. I suggested he continue with Azilect and Mirapex ER at the same doses but and a half pill of levodopa to his regimen.   I saw him on 06/16/2013, at which time he reported feeling stable, with the exception of occasional start hesitation and trouble turning. I encouraged him to increase his exercise to more daily walking exercises and kept his medications the same.  I first met him on 12/12/2012, at which time I felt that his exam was stable. I did not make any medication changes at the time but suggested he consider taking coenzyme Q 10.    He previously followed by Dr. Morene Antu and was last seen by him on 06/14/2012, and which time Dr. Erling Cruz felt that he was doing very well. He did not make any changes to his medication regimen.   He was diagnosed with idiopathic right-sided predominant Parkinson's disease in May 2007 and initially treated with long-acting Mirapex, rasagiline and levodopa. He exercises regularly. He has had some daytime somnolence. He snores at night. In February 2013 he went on disability. He denied compulsive behavior, orthostatic hypotension or edema. He has had some difficulty with his memory. His mother has dementia at age 58. He lives alone, and takes care of his mother, who stays with him one week and with his only brother one week. He stays active and  tends to sleep better when his mother stays with his brother as he can rest more and relax more. He has had no depression, anxiety, hallucinations, delusions or major memory problems, or dyskinesias.    His Past Medical History Is Significant For: Past Medical History  Diagnosis Date  . Parkinson disease   . Hypertension     His Past Surgical History Is Significant For: Past Surgical History  Procedure Laterality Date  . Hernia repair      His Family History Is Significant For: Family History  Problem Relation Age of Onset   . Heart attack Father     His Social History Is Significant For: Social History   Social History  . Marital Status: Single    Spouse Name: N/A  . Number of Children: 3  . Years of Education: 12th   Occupational History  . N/A     retired   Social History Main Topics  . Smoking status: Never Smoker   . Smokeless tobacco: Former Systems developer    Types: Chew     Comment: quit 8 years ago  . Alcohol Use: No  . Drug Use: No  . Sexual Activity: Not Asked   Other Topics Concern  . None   Social History Narrative   Patient is right handed, resides alone, mother resides with him 1/2 the time,has dementia    His Allergies Are:  No Known Allergies:   His Current Medications Are:  Outpatient Encounter Prescriptions as of 01/06/2015  Medication Sig  . carbidopa-levodopa (SINEMET IR) 25-100 MG per tablet 1/2 pill at 7 AM, 1/2 pill at 11 AM, 1 pill at 3 PM and 1/2 pill at 7 PM.  . MIRAPEX ER 2.25 MG TB24 Take 2.25 mg by mouth daily.  . rasagiline (AZILECT) 1 MG TABS tablet Take 1 tablet (1 mg total) by mouth daily.   No facility-administered encounter medications on file as of 01/06/2015.  :  Review of Systems:  Out of a complete 14 point review of systems, all are reviewed and negative with the exception of these symptoms as listed below:   Review of Systems  Neurological:       At last visit patient's Sinemet was increased and he feels like he is doing well on it. Patient states that he feels like he lives from "pill to pill", meaning he is ready for the next dose.   All other systems reviewed and are negative.   Objective:  Neurologic Exam  Physical Exam Physical Examination:   Filed Vitals:   01/06/15 1112  BP: 124/75  Pulse: 59  Resp: 14   General Examination: The patient is a very pleasant 64 y.o. male in no acute distress.  HEENT: Normocephalic, atraumatic, pupils are equal, round and reactive to light and accommodation. Funduscopic exam is normal with sharp disc  margins noted. Extraocular tracking shows mild saccadic breakdown without nystagmus noted. There is limitation to upper gaze. There is mild decrease in eye blink rate. Hearing is intact. Face is symmetric with mild facial masking and normal facial sensation. There is no lip, neck or jaw tremor. Neck is moderately rigid with intact passive ROM. There is a slight left head tilt, unchanged. There are no carotid bruits on auscultation. Oropharynx exam reveals mild mouth dryness. No significant airway crowding is noted. Mallampati is class II. Tongue protrudes centrally and palate elevates symmetrically.   There is no drooling.   Chest: is clear to auscultation without wheezing, rhonchi or crackles noted.  Heart:  sounds are regular and normal without murmurs, rubs or gallops noted.   Abdomen: is soft, non-tender and non-distended with normal bowel sounds appreciated on auscultation.  Extremities: There is no pitting edema in the distal lower extremities bilaterally. Pedal pulses are intact.   Skin: is warm and dry with no trophic changes noted.    Musculoskeletal: exam reveals no obvious joint deformities, tenderness, joint swelling or erythema.  Neurologically:  Mental status: The patient is awake and alert, paying good  attention. He is able to completely provide the history. He is oriented to: person, place, time/date, situation, day of week, month of year and year. His memory, attention, language and knowledge are intact. There is no aphasia, agnosia, apraxia or anomia. There is noree of bradyphrenia. Speech is mildly hypophonic with no dysarthria noted. Mood is congruent and affect is normal.    Cranial nerves are as described above under HEENT exam. In addition, shoulder shrug is normal but L shoulder is slightly higher.   Motor exam: Normal bulk, and strength for age is noted. There are no dyskinesias noted.   Tone is mildly rigid with presence of cogwheeling in the right upper extremity.  There is overall mild bradykinesia. There is no drift or rebound.  There is no tremor.  Romberg is negative.  Reflexes are 1+ in the upper extremities and 1+ in the lower extremities.   Fine motor skills exam: Finger taps are moderately to severely impaired on the right and mild to moderately impaired on the left. Hand movements are moderately impaired on the right and mildly impaired on the left. RAP (rapid alternating patting) is moderately impaired on the right and mildly impaired on the left. Foot taps are moderately impaired on the right and moderately impaired on the left. Foot agility (in the form of heel stomping) is moderately impaired on the right and moderately impaired on the left.    Cerebellar testing shows no dysmetria or intention tremor on finger to nose testing. Heel to shin is unremarkable bilaterally. There is no truncal or gait ataxia.   Sensory exam is intact to light touch, temp, and vibration in the UEs and LEs.  Gait, station and balance: He stands up from the seated position with minimal difficulty and does not need to push up with His hands. He needs no assistance. No veering to one side is noted. He is not noted to lean to the side. Posture is mild to moderately stooped for age, slightly worse than last time. Stance is narrow-based. He walks with decrease in stride length and good pace and decreased arm swing on the right. He turns en bloc. Tandem walk is possible with mild difficulty. Balance is not significantly impaired.  Assessment and Plan:   In summary, DRAVIN LANCE is a very pleasant 64 year old male with an underlying medical history of hypertension and chest pain, status post hernia repair in 2012, who presents for followup consultation of his right-sided predominant Parkinson's disease, akinetic-rigid type, with symptoms and diagnosis going back to 2007. His physical exam has remained fairly stable for quite some time in the past, he has  Had mild progression  in the past couple of years. His posture has slightly worsened. He notices a tremor in his right hand especially when he is fatigued towards the end of the day. He does report that increasing his afternoon Sinemet has helped. He is somewhat apprehensive about taking more levodopa with time because of fear of side effects. I explained to him  that he is still on a small dose of levodopa and has been doing well with it. In particular, he denies any significant problems with dizziness, hallucinations, or dyskinesias. As far as the Mirapex long-acting, he is on 2.25 mg. He is on brand-name medication. He has had no side effects from this area today, I talked to him at length about alternatives and changes in his medication regimen as well as DBS briefly. I suggested we increase his Mirapex ER to 3 mg once daily. He has a lot of pills left and can start the new dose when he is done with his current prescription. We do have room to increase the levodopa with time as well.  We will continue with Azilect 1 mg once daily and the Sinemet at the current dose which is half a pill around 6 or 7 AM, half a pill around 10 or 11 AM, 1 pill around 3 PM and half a pill at 7 PM. I asked him to try to drink enough fluid,  In particular in the form of water. I asked him to continue to stay active mentally and physically. I would like to see him back in 6 months, sooner if the need arises. I answered all his questions today and he was in agreement. I spent 20 minutes in total face-to-face time with the patient, more than 50% of which was spent in counseling and coordination of care, reviewing test results, reviewing medication and discussing or reviewing the diagnosis of PD, its prognosis and treatment options.

## 2015-01-06 NOTE — Patient Instructions (Signed)
The only change for today is: increase in Mirapex ER to 3 mg once daily.  Continue Azilect once daily.  Continue sinemet 1/2 pill for 3 doses and 1 whole pill around 3 PM.

## 2015-03-19 NOTE — Telephone Encounter (Signed)
Error

## 2015-05-05 ENCOUNTER — Other Ambulatory Visit: Payer: Self-pay

## 2015-05-05 DIAGNOSIS — G20 Parkinson's disease: Secondary | ICD-10-CM

## 2015-05-05 MED ORDER — CARBIDOPA-LEVODOPA 25-100 MG PO TABS: ORAL_TABLET | ORAL | Status: DC

## 2015-07-12 ENCOUNTER — Ambulatory Visit (INDEPENDENT_AMBULATORY_CARE_PROVIDER_SITE_OTHER): Payer: Medicare Other | Admitting: Neurology

## 2015-07-12 ENCOUNTER — Encounter: Payer: Self-pay | Admitting: Neurology

## 2015-07-12 VITALS — BP 128/70 | HR 58 | Resp 14 | Ht 70.0 in | Wt 160.0 lb

## 2015-07-12 DIAGNOSIS — G20 Parkinson's disease: Secondary | ICD-10-CM | POA: Diagnosis not present

## 2015-07-12 MED ORDER — RASAGILINE MESYLATE 1 MG PO TABS: 1.0000 mg | ORAL_TABLET | Freq: Every day | ORAL | Status: DC

## 2015-07-12 MED ORDER — CARBIDOPA-LEVODOPA 25-100 MG PO TABS: ORAL_TABLET | ORAL | Status: DC

## 2015-07-12 NOTE — Patient Instructions (Addendum)
We will keep your meds the same, but try to keep the 4 hourly schedule for the Sinemet.

## 2015-07-12 NOTE — Progress Notes (Signed)
Subjective:    Patient ID: Jack Cobb is a 65 y.o. male.  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 He is unaccompanied today. I last saw him on 01/06/2015, at which time he reported doing fairly well overall. He was active. He had some increased stiffness particularly in the mornings and later in the evenings. He would sometimes forget his evening dose of Sinemet. He still had some reservations about taking Sinemet because of fear of potential long-term effects and side effects. We mutually agreed to keep his Sinemet at 3 half pills a day for 3 doses, and one whole pill around 3 PM. He was advised to continue with Azilect once daily as well. I suggested he increase his Mirapex ER from 2.25 mg to 3 mg once daily. He was on brand-name Mirapex ER.  Today, 07/12/2015: He reports feeling stable, not much change since the increase in the Mirapex ER. He has been less stressed, he transioned is mother to [facility] and she has had a fairly good transition, given her age of 7. This was about 2 weeks ago, has been sleeping better. Mostly takes Sinemet half a pill 4 times a day, sometimes a whole pill at 3 PM but sometimes stretches the 11:00 dose to 3 PM and only takes 3 doses per day at times. He tries to stay very active. He has not started a formal exercise program however. He tries to drink water but drinks coffee and also likes soda.  Previously:   I saw him on 07/08/2014, at which time he reported more tremors, especially towards the end of the day. He reported more fatigue, and stiffness and slowness after 3 PM. He was taking Azilect once daily and brand-name Mirapex long-acting. He was taking Sinemet half a pill 3 times a day. He  continued to be very active and take care of his elderly mother, 15 years old, alternating her care with his brother. I suggested he continue with Azilect once daily and Mirapex ER but he increase Sinemet to half a pill at 7 AM, half a pill at 11 AM, 1 whole pill at 3 PM and half a pill at 7 PM.   I saw him on 01/05/2014, at which time he reported feeling worse with his posture and his walking was slower. He felt stable with his mood and memory. He had mild constipation. He continued to work here and there. He reported no issues with driving. His mother continue to stay with him every other week and he was sharing care of her with his brother. His mother has dementia and is in her mid 15s. I suggested he continue with Azilect and Mirapex ER at the same doses but and a half pill of levodopa to his regimen.   I saw him on 06/16/2013, at which time he reported feeling stable, with the exception of occasional start hesitation and trouble turning. I encouraged him to increase his exercise to more daily walking exercises and kept his medications the same.  I first met him on 12/12/2012, at which time I felt that his exam was stable. I did not make any medication changes at the time but suggested he consider taking coenzyme Q 10.    He previously followed by Dr.  Morene Antu and was last seen by him on 06/14/2012, and which time Dr. Erling Cruz felt that he was doing very well. He did not make any changes to his medication regimen.   He was diagnosed with idiopathic right-sided predominant Parkinson's disease in May 2007 and initially treated with long-acting Mirapex, rasagiline and levodopa. He exercises regularly. He has had some daytime somnolence. He snores at night. In February 2013 he went on disability. He denied compulsive behavior, orthostatic hypotension or edema. He has had some difficulty with his memory. His mother has dementia at age 60. He lives alone, and takes care of his mother, who stays with him one  week and with his only brother one week. He stays active and tends to sleep better when his mother stays with his brother as he can rest more and relax more. He has had no depression, anxiety, hallucinations, delusions or major memory problems, or dyskinesias.      His Past Medical History Is Significant For: Past Medical History  Diagnosis Date  . Parkinson disease ([place])   . Hypertension     His Past Surgical History Is Significant For: Past Surgical History  Procedure Laterality Date  . Hernia repair      His Family History Is Significant For: Family History  Problem Relation Age of Onset  . Heart attack Father     His Social History Is Significant For: Social History   Social History  . Marital Status: Single    Spouse Name: N/A  . Number of Children: 3  . Years of Education: 12th   Occupational History  . N/A     retired   Social History Main Topics  . Smoking status: Never Smoker   . Smokeless tobacco: Former Systems developer    Types: Chew     Comment: quit 8 years ago  . Alcohol Use: No  . Drug Use: No  . Sexual Activity: Not Asked   Other Topics Concern  . None   Social History Narrative   Patient is right handed, resides alone, mother resides with him 1/2 the time,has dementia    His Allergies Are:  No Known Allergies:   His Current Medications Are:  Outpatient Encounter Prescriptions as of 07/12/2015  Medication Sig  . carbidopa-levodopa (SINEMET IR) 25-100 MG tablet 1/2 pill at 7 AM, 1/2 pill at 11 AM, 1 pill at 3 PM and 1/2 pill at 7 PM.  . Pramipexole Dihydrochloride (MIRAPEX ER) 3 MG TB24 Take 1 tablet (3 mg total) by mouth daily.  . rasagiline (AZILECT) 1 MG TABS tablet Take 1 tablet (1 mg total) by mouth daily.  . [DISCONTINUED] carbidopa-levodopa (SINEMET IR) 25-100 MG tablet 1/2 pill at 7 AM, 1/2 pill at 11 AM, 1 pill at 3 PM and 1/2 pill at 7 PM.  . [DISCONTINUED] rasagiline (AZILECT) 1 MG TABS tablet Take 1 tablet (1 mg total) by mouth daily.    No facility-administered encounter medications on file as of 07/12/2015.  :  Review of Systems:  Out of a complete 14 point review of systems, all are reviewed and negative with the exception of these symptoms as listed below:   Review of Systems  Neurological:       No new concerns per patient. Since Mirapex was increased, patient states that he has not noticed a difference.     Objective:  Neurologic Exam  Physical Exam Physical Examination:   Filed Vitals:   07/12/15 0823  BP: 128/70  Pulse: 58  Resp:  14    General Examination: The patient is a very pleasant 65 y.o. male in no acute distress. He is in good spirits.  HEENT: Normocephalic, atraumatic, pupils are equal, round and reactive to light and accommodation. Extraocular tracking shows mild saccadic breakdown without nystagmus noted. There is limitation to upper gaze. There is mild decrease in eye blink rate. Hearing is intact. Face is symmetric with mild facial masking and normal facial sensation. There is no lip, neck or jaw tremor. Neck is moderately rigid with intact passive ROM. There is a slight left head tilt, stable There are no carotid bruits on auscultation. Oropharynx exam reveals mild mouth dryness. No significant airway crowding is noted. Mallampati is class II. Tongue protrudes centrally and palate elevates symmetrically.   There is no drooling.   Chest: is clear to auscultation without wheezing, rhonchi or crackles noted.  Heart: sounds are regular and normal without murmurs, rubs or gallops noted.   Abdomen: is soft, non-tender and non-distended with normal bowel sounds appreciated on auscultation.  Extremities: There is no pitting edema in the distal lower extremities bilaterally. Pedal pulses are intact.   Skin: is warm and dry with no trophic changes noted.    Musculoskeletal: exam reveals no obvious joint deformities, tenderness, joint swelling or erythema.  Neurologically:  Mental status: The  patient is awake and alert, paying good  attention. He is able to completely provide the history. He is oriented to: person, place, time/date, situation, day of week, month of year and year. His memory, attention, language and knowledge are intact. There is no aphasia, agnosia, apraxia or anomia. There is noree of bradyphrenia. Speech is mildly hypophonic with no dysarthria noted. Mood is congruent and affect is normal.    Cranial nerves are as described above under HEENT exam. In addition, shoulder shrug is normal but L shoulder is slightly lower today.   Motor exam: Normal bulk, and strength for age is noted. There are no dyskinesias noted.   Tone is mildly rigid with presence of cogwheeling in the right upper extremity. There is overall mild bradykinesia. There is no drift or rebound.  There is no tremor.  Romberg is negative.  Reflexes are 1+ in the upper extremities and 1+ in the lower extremities.   Fine motor skills exam: Finger taps are moderately to severely impaired on the right and mild to moderately impaired on the left. Hand movements are moderately impaired on the right and mildly impaired on the left. RAP (rapid alternating patting) is moderately impaired on the right and mildly impaired on the left. Foot taps are moderately impaired on the right and moderately impaired on the left. Foot agility (in the form of heel stomping) is moderately impaired on the right and moderately impaired on the left.    Cerebellar testing shows no dysmetria or intention tremor on finger to nose testing. Heel to shin is unremarkable bilaterally. There is no truncal or gait ataxia.   Sensory exam is intact to light touch in the UEs and LEs.  Gait, station and balance: He stands up from the seated position with minimal difficulty and does not need to push up with His hands. He needs no assistance. No veering to one side is noted. He is not noted to lean to the side. Posture is moderately stooped for age,  slightly worse. Stance is narrow-based. He walks with decrease in stride length and good pace and decreased arm swing, right more right more than left. He turns en bloc. Balance  is not significantly impaired.  Assessment and Plan:   In summary, Jack Cobb is a very pleasant 65 year old male with an underlying medical history of hypertension and chest pain, status post hernia repair in 2012, who presents for followup consultation of his right-sided predominant Parkinson's disease, akinetic-rigid type, with symptoms and diagnosis going back to 2007. His physical exam has remained fairly stable for quite some time in the past, but he has had some progression in his posture and stiffness. He has a slight degree of worsening in his posture for my exam today. I asked him to stay on schedule with the levodopa not skip it. I would like for him to be on a 4 hourly schedule. We increase the Mirapex long-acting, brand-name to 3 mg last time. He has had no telltale response after increasing it from 2.25 mg. We talked about treatment options down the road. He is encouraged to consider increasing the levodopa to 1 whole pill in the morning, half a pill around 11, 1 pill at 3 PM and half a pill at 7 PM at this time. We again talked about potential side effects with levodopa but he is advised not to worry about sinister side effects as he has done so well with it and our ultimate goal is to make him feel better, have a good quality of life and not worry or fearful of side effects. He is encouraged to stay active and incorporate an exercise program in his schedule. He is advised to try to drink more water and reduced his caffeine intake and soda intake. Overall, he looks well and has done well. His memory and mood are stable. He is advised to follow-up with me in 6 months, sooner if the need arises. He would like to stay for now on his current regimen of half pill 3 times a day and 1 at 3 PM. Nevertheless, he knows he can  increase it. Thankfully he has had no side effects from the dopamine agonist such as swelling or hallucinations or impulse control disorder. We will continue to screen he  him every time. He is encouraged to call for any interim questions or concerns. I renewed all his prescriptions today. I answered all his questions today and he was in agreement with the plan.  I spent 25 minutes in total face-to-face time with the patient, more than 50% of which was spent in counseling and coordination of care, reviewing test results, reviewing medication and discussing or reviewing the diagnosis of PD, its prognosis and treatment options.

## 2015-09-01 ENCOUNTER — Other Ambulatory Visit: Payer: Self-pay | Admitting: *Deleted

## 2015-09-01 DIAGNOSIS — G20 Parkinson's disease: Secondary | ICD-10-CM

## 2015-09-01 MED ORDER — RASAGILINE MESYLATE 1 MG PO TABS: 1.0000 mg | ORAL_TABLET | Freq: Every day | ORAL | Status: DC

## 2015-09-01 NOTE — Telephone Encounter (Signed)
I called pt after receiving request for refill.  He stated that due to his insurance he is getting from [facility] for the next 4 months or so and then will kick in the printed script to [place].  Needs prescription for 4 refills. Done.

## 2015-11-08 ENCOUNTER — Telehealth: Payer: Self-pay

## 2015-11-08 DIAGNOSIS — G20 Parkinson's disease: Secondary | ICD-10-CM

## 2015-11-08 MED ORDER — RASAGILINE MESYLATE 1 MG PO TABS: 1.0000 mg | ORAL_TABLET | Freq: Every day | ORAL | Status: DC

## 2015-11-08 NOTE — Telephone Encounter (Signed)
90 day refill request

## 2016-01-10 ENCOUNTER — Encounter: Payer: Self-pay | Admitting: Neurology

## 2016-01-10 ENCOUNTER — Ambulatory Visit (INDEPENDENT_AMBULATORY_CARE_PROVIDER_SITE_OTHER): Payer: Medicare Other | Admitting: Neurology

## 2016-01-10 DIAGNOSIS — G20 Parkinson's disease: Secondary | ICD-10-CM | POA: Diagnosis not present

## 2016-01-10 MED ORDER — PRAMIPEXOLE DIHYDROCHLORIDE ER 3 MG PO TB24: 3.0000 mg | ORAL_TABLET | Freq: Every day | ORAL | 3 refills | Status: DC

## 2016-01-10 MED ORDER — RASAGILINE MESYLATE 1 MG PO TABS: 1.0000 mg | ORAL_TABLET | Freq: Every day | ORAL | 5 refills | Status: DC

## 2016-01-10 MED ORDER — CARBIDOPA-LEVODOPA 25-100 MG PO TABS: ORAL_TABLET | ORAL | 3 refills | Status: DC

## 2016-01-10 NOTE — Progress Notes (Signed)
Subjective:    Cobb ID: Jack Cobb is a 65 y.o. male.  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 Jack Cobb is unaccompanied today. I last saw Jack Cobb on 07/12/2015, at which time Jack Cobb reported feeling stable. Jack Cobb did not notice much in Jack way of difference after Jack increase in Jack Mirapex long-acting. Overall, Jack Cobb was less stressed. Jack Cobb was able to transition his mother took class nursing home and she did quite well given her age of 107. Jack Cobb was sleeping a little better not having to worry about his mother on Jack time. Jack Cobb was taking Sinemet half a pill 4 times a day sometimes a whole pill at 3 PM, sometimes would get only 3 doses and for Jack day. Jack Cobb is trying to stay active and trying to do better with his water intake as opposed to coffee and soda. I suggested Jack Cobb could consider alternating 1 pill with half a pill for a total of 3 pills daily, but Jack Cobb preferred to stay on Jack regimen of about 2 and half pills daily.   Today, 01/10/2016: Jack Cobb reports doing okay, later in Jack morning Jack Cobb has more    Previously:      I saw Jack Cobb on 01/06/2015, at which time Jack Cobb reported doing fairly well overall. Jack Cobb was active. Jack Cobb had some increased stiffness particularly in Jack mornings and later in Jack evenings. Jack Cobb would sometimes forget his evening dose of Sinemet. Jack Cobb still had some reservations about taking Sinemet because of fear of potential long-term effects and side effects. We mutually agreed to keep his Sinemet at 3 half pills a day for 3 doses, and one whole pill around 3 PM. Jack Cobb was advised to continue with Azilect once daily as well. I suggested Jack Cobb increase his Mirapex ER from 2.25 mg to 3 mg once daily. Jack Cobb was on brand-name Mirapex ER.   I saw Jack Cobb on  07/08/2014, at which time Jack Cobb reported more tremors, especially towards Jack end of Jack day. Jack Cobb reported more fatigue, and stiffness and slowness after 3 PM. Jack Cobb was taking Azilect once daily and brand-name Mirapex long-acting. Jack Cobb was taking Sinemet half a pill 3 times a day. Jack Cobb continued to be very active and take care of his elderly mother, 82 years old, alternating her care with his brother. I suggested Jack Cobb continue with Azilect once daily and Mirapex ER but Jack Cobb increase Sinemet to half a pill at 7 AM, half a pill at 11 AM, 1 whole pill at 3 PM and half a pill at 7 PM.    I saw Jack Cobb on 01/05/2014, at which time Jack Cobb reported feeling worse with his posture and his walking was slower. Jack Cobb felt stable with his mood and memory. Jack Cobb had mild constipation. Jack Cobb continued to work here and there. Jack Cobb reported no issues with driving. His mother continue to stay with Jack Cobb every other week and Jack Cobb was sharing care of her with his brother. His mother has dementia and is in her mid 26s. I suggested Jack Cobb continue with Azilect and Mirapex ER at Jack same doses but and a half pill of levodopa to his regimen.     I saw Jack Cobb on 06/16/2013, at which time Jack Cobb reported feeling stable, with Jack exception of occasional start  hesitation and trouble turning. I encouraged Jack Cobb to increase his exercise to more daily walking exercises and kept his medications Jack same.   I first met Jack Cobb on 12/12/2012, at which time I felt that his exam was stable. I did not make any medication changes at Jack time but suggested Jack Cobb consider taking coenzyme Q 10.     Jack Cobb previously followed by Dr. Morene Antu and was last seen by Jack Cobb on 06/14/2012, and which time Dr. Erling Cruz felt that Jack Cobb was doing very well. Jack Cobb did not make any changes to his medication regimen.   Jack Cobb was diagnosed with idiopathic right-sided predominant Parkinson's disease in May 2007 and initially treated with long-acting Mirapex, rasagiline and levodopa. Jack Cobb exercises regularly. Jack Cobb has had some daytime  somnolence. Jack Cobb snores at night. In February 2013 Jack Cobb went on disability. Jack Cobb denied compulsive behavior, orthostatic hypotension or edema. Jack Cobb has had some difficulty with his memory. His mother has dementia at age 48. Jack Cobb lives alone, and takes care of his mother, who stays with Jack Cobb one week and with his only brother one week. Jack Cobb stays active and tends to sleep better when his mother stays with his brother as Jack Cobb can rest more and relax more. Jack Cobb has had no depression, anxiety, hallucinations, delusions or major memory problems, or dyskinesias.     His Past Medical History Is Significant For: Past Medical History:  Diagnosis Date  . Hypertension   . Parkinson disease ([place])     His Past Surgical History Is Significant For: Past Surgical History:  Procedure Laterality Date  . HERNIA REPAIR      His Family History Is Significant For: Family History  Problem Relation Age of Onset  . Heart attack Father     His Social History Is Significant For: Social History   Social History  . Marital status: Single    Spouse name: N/A  . Number of children: 3  . Years of education: 12th   Occupational History  . N/A     retired   Social History Main Topics  . Smoking status: Never Smoker  . Smokeless tobacco: Former Systems developer    Types: Chew     Comment: quit 8 years ago  . Alcohol use No  . Drug use: No  . Sexual activity: Not Asked   Other Topics Concern  . None   Social History Narrative   Cobb is right handed, resides alone, mother resides with Jack Cobb 1/2 Jack time,has dementia    His Allergies Are:  No Known Allergies:   His Current Medications Are:  Outpatient Encounter Prescriptions as of 01/10/2016  Medication Sig  . carbidopa-levodopa (SINEMET IR) 25-100 MG tablet 1/2 pill at 7 AM, 2 pill at 11 AM, 1 pill at 3 PM and 1/2 pill at 7 PM.  . Pramipexole Dihydrochloride (MIRAPEX ER) 3 MG TB24 Take 1 tablet (3 mg total) by mouth daily.  . rasagiline (AZILECT) 1 MG TABS tablet Take 1  tablet (1 mg total) by mouth daily.  . [DISCONTINUED] carbidopa-levodopa (SINEMET IR) 25-100 MG tablet 1/2 pill at 7 AM, 1/2 pill at 11 AM, 1 pill at 3 PM and 1/2 pill at 7 PM.  . [DISCONTINUED] Pramipexole Dihydrochloride (MIRAPEX ER) 3 MG TB24 Take 1 tablet (3 mg total) by mouth daily.  . [DISCONTINUED] rasagiline (AZILECT) 1 MG TABS tablet Take 1 tablet (1 mg total) by mouth daily.   No facility-administered encounter medications on file as of 01/10/2016.   :  Review of  Systems:  Out of a complete 14 point review of systems, all are reviewed and negative with Jack exception of these symptoms as listed below:  Review of Systems  Neurological:       No new concerns per Cobb.     Objective:  Neurologic Exam  Physical Exam Physical Examination:   Vitals:   01/10/16 0830  BP: 121/74  Pulse: (!) 56  Resp: 14    General Examination: Jack Cobb is a very pleasant 65 y.o. male in no acute distress. Jack Cobb is in good spirits.   HEENT: Normocephalic, atraumatic, pupils are equal, round and reactive to light and accommodation. Extraocular tracking shows mild saccadic breakdown without nystagmus noted. There is limitation to upper gaze. There is mild decrease in eye blink rate. Hearing is intact. Face is symmetric with mild facial masking and normal facial sensation. There is no lip, neck or jaw tremor. Neck is mild to moderately rigid with intact passive ROM. There is a slight left head tilt, stable There are no carotid bruits on auscultation. Oropharynx exam reveals mild mouth dryness. No significant airway crowding is noted. Mallampati is class II. Tongue protrudes centrally and palate elevates symmetrically.   There is no drooling.   Chest: is clear to auscultation without wheezing, rhonchi or crackles noted.  Heart: sounds are regular and normal without murmurs, rubs or gallops noted.   Abdomen: is soft, non-tender and non-distended with normal bowel sounds appreciated on  auscultation.  Extremities: There is no pitting edema in Jack distal lower extremities bilaterally. Pedal pulses are intact.   Skin: is warm and dry with no trophic changes noted.    Musculoskeletal: exam reveals no obvious joint deformities, tenderness, joint swelling or erythema.  Neurologically:  Mental status: Jack Cobb is awake and alert, paying good  attention. Jack Cobb is able to completely provide Jack history. Jack Cobb is oriented to: person, place, time/date, situation, day of week, month of year and year. His memory, attention, language and knowledge are intact. There is no aphasia, agnosia, apraxia or anomia. There is noree of bradyphrenia. Speech is mildly hypophonic with no dysarthria noted. Mood is congruent and affect is normal.    Cranial nerves are as described above under HEENT exam. In addition, shoulder shrug is normal but L shoulder is slightly lower today.   Motor exam: Normal bulk, and strength for age is noted. There are no dyskinesias noted.   Tone is mildly rigid with presence of cogwheeling in Jack right upper extremity. There is overall mild bradykinesia. There is no drift or rebound.  There is no tremor.  Romberg is negative.  Reflexes are 1+ in Jack upper extremities and 1+ in Jack lower extremities.   Fine motor skills exam: Fine motor skills are moderately impaired on Jack right and slightly better on Jack left.  Cerebellar testing shows no dysmetria or intention tremor on finger to nose testing. Heel to shin is unremarkable bilaterally. There is no truncal or gait ataxia.   Sensory exam is intact to light touch in Jack UEs and LEs.  Gait, station and balance: Jack Cobb stands up from Jack seated position with minimal difficulty and does not need to push up with His hands. Jack Cobb needs no assistance. No veering to one side is noted. Jack Cobb is not noted to lean to Jack side. Posture is moderately stooped for age, stable. Stance is narrow-based. Jack Cobb walks with decrease in stride length and good  pace and decreased arm swing, right more right more than left. Jack Cobb  turns en bloc. Balance is not significantly impaired.  Assessment and Plan:   In summary, Jack Cobb is a very pleasant 65 year old male with an underlying medical history of hypertension and chest pain, status post hernia repair in 2012, who presents for followup consultation of his right-sided predominant Parkinson's disease, akinetic-rigid type, with symptoms and diagnosis going back to 2007. His physical exam has remained fairly stable for quite some time in Jack past, but Jack Cobb has had some progression in his posture and stiffnessWith time. Jack Cobb has had some worsening of his posture as well. Overall, however, Jack Cobb has done rather well, stays active physically, has had stable memory and mood, has had no significant constipation, no significant balance problems. We can continue with long-acting Mirapex brand-name 3 mg daily which we increased in Jack recent past and Azilect generic once daily, Sinemet 4 times a day, Jack Cobb has been taking half a pill for Jack first 2 doses, a full pill at 3 PM and a half a pill at 7 PM, Jack Cobb is agreeable to increasing Jack second dose of Jack day to 1 pill as well for total of 3 whole pills daily. We again talked about potential side effects with levodopa and expectations, Jack Cobb is worried about long-term effects of levodopa and side effects. Jack Cobb is advised to follow-up with me in 6 months, sooner if Jack need arises. Jack Cobb would like to stay for now on his current regimen of half pill 3 times a day and 1 at 3 PM. Nevertheless, Jack Cobb knows Jack Cobb can increase it. Thankfully Jack Cobb has had no side effects from Jack dopamine agonist such as swelling or hallucinations or impulse control disorder. We will continue to screen Jack Cobb every time. Jack Cobb is encouraged to call for any interim questions or concerns. I renewed all his prescriptions today. I answered all his questions today and Jack Cobb was in agreement with Jack plan.  I spent 25 minutes in total  face-to-face time with Jack Cobb, more than 50% of which was spent in counseling and coordination of care, reviewing test results, reviewing medication and discussing or reviewing Jack diagnosis of PD, its prognosis and treatment options.

## 2016-01-10 NOTE — Patient Instructions (Signed)
We will continue your meds. You can take a whole pill of levodopa at 10-11 AM, rest is the same.

## 2016-07-12 ENCOUNTER — Ambulatory Visit: Payer: Medicare Other | Admitting: Neurology

## 2016-07-12 ENCOUNTER — Telehealth: Payer: Self-pay

## 2016-07-12 NOTE — Telephone Encounter (Addendum)
Pt did not show for their appt with Dr. Athar today.  

## 2016-07-19 ENCOUNTER — Encounter: Payer: Self-pay | Admitting: Neurology

## 2016-09-27 ENCOUNTER — Encounter: Payer: Self-pay | Admitting: Neurology

## 2016-09-27 ENCOUNTER — Ambulatory Visit (INDEPENDENT_AMBULATORY_CARE_PROVIDER_SITE_OTHER): Payer: Medicare Other | Admitting: Neurology

## 2016-09-27 ENCOUNTER — Encounter (INDEPENDENT_AMBULATORY_CARE_PROVIDER_SITE_OTHER): Payer: Self-pay

## 2016-09-27 VITALS — BP 132/64 | HR 68 | Resp 14 | Ht 70.0 in | Wt 152.0 lb

## 2016-09-27 DIAGNOSIS — G20 Parkinson's disease: Secondary | ICD-10-CM | POA: Diagnosis not present

## 2016-09-27 MED ORDER — RASAGILINE MESYLATE 1 MG PO TABS: 1.0000 mg | ORAL_TABLET | Freq: Every day | ORAL | 5 refills | Status: DC

## 2016-09-27 MED ORDER — PRAMIPEXOLE DIHYDROCHLORIDE ER 3 MG PO TB24: 3.0000 mg | ORAL_TABLET | Freq: Every day | ORAL | 3 refills | Status: DC

## 2016-09-27 MED ORDER — CARBIDOPA-LEVODOPA 25-100 MG PO TABS: 1.0000 | ORAL_TABLET | Freq: Every day | ORAL | 3 refills | Status: DC

## 2016-09-27 NOTE — Patient Instructions (Addendum)
We will continue with your Mirapex ER 3 mg and Azilect generic 1 mg daily.  We will try to increase your generic Sinemet to 1 pill 5 times a day.  Constipation can be a big problem in advanced or advancing Parkinson's disease. It is important to be proactive: this includes ensuring adequate water intake and mobilization (walking around), utilizing stool softeners as needed, an over-the-counter laxative as needed up to daily if needed, adding a probiotic in pill form or in the form of yogurt can help as well. Sometimes, using a suppository or enema becomes necessary.   Please exercise more regularly.

## 2016-09-27 NOTE — Progress Notes (Signed)
Subjective:    Patient ID: Jack Cobb is a 66 y.o. male.  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 He is accompanied by his GF today. Of note, the patient no showed for appointment on 07/12/2016 secondary to illness. I last saw him on 01/10/2016, at which time he reported worsening of his posture in more stiffness. He had no significant problems with balance or constipation or mood. He was taking overall a low dose of Sinemet. I suggested he could continue with the current regimen but increase his second dose also to a whole pill. He was taking half a pill for 3 doses and 1 whole pill at 3 PM. I suggested he could try to increase his Sinemet. Overall, he felt fairly stable but I did notice some changes on exam as well.  Today, 09/27/2016 (all dictated new, as well as above notes, some dictation done in note pad or Word, outside of chart, may appear as copied):  He reports doing okay, biggest concern is that the levodopa seems to wear off after 3 hours, he takes half a pill in the morning, then 1 pill, after about 3-4 hours, another full pill about 3-4 hours later and half pill in the evening. He has taken a whole pill, when he needed to be active. He does not exercise on a regular basis but is fairly active. He has had intermittent constipation. He tries to hydrate with water and also drinks ensure at times. Coffee in the morning and 1 soda per day typically. He sleeps fairly well. Mood and memory are stable, slight forgetfulness, some mood irritability per girlfriend, Jack Cobb, but nothing out of his usual. Overall, no new concerns per Jack Cobb, he feels that his balance is not as good, posture slightly worse especially when the Sinemet  wears off. He takes Mirapex ER brand-name which he gets from [place]. Azilect is generic and the first 6 months of the gets it from his pharmacy, the last months of the year, when the price goes up, he orders from [place].    The patient's allergies, current medications, family history, past medical history, past social history, past surgical history and problem list were reviewed and updated as appropriate.   Previously (copied from previous notes for reference):   I saw him on 07/12/2015, at which time he reported feeling stable. He did not notice much in the way of difference after the increase in the Mirapex long-acting. Overall, he was less stressed. He was able to transition his mother took class nursing home and she did quite well given her age of 65. He was sleeping a little better not having to worry about his mother on the time. He was taking Sinemet half a pill 4 times a day sometimes a whole pill at 3 PM, sometimes would get only 3 doses and for the day. He is trying to stay active and trying to do better with his water intake as opposed to coffee and soda. I suggested he could consider alternating 1 pill with half a pill for a total of 3 pills daily, but he preferred to stay on the regimen of about 2 and half pills daily.     I saw him on 01/06/2015, at which time he reported doing  fairly well overall. He was active. He had some increased stiffness particularly in the mornings and later in the evenings. He would sometimes forget his evening dose of Sinemet. He still had some reservations about taking Sinemet because of fear of potential long-term effects and side effects. We mutually agreed to keep his Sinemet at 3 half pills a day for 3 doses, and one whole pill around 3 PM. He was advised to continue with Azilect once daily as well. I suggested he increase his Mirapex ER from 2.25 mg to 3 mg once daily. He was on brand-name Mirapex ER.   I saw him on 07/08/2014, at which time he reported more  tremors, especially towards the end of the day. He reported more fatigue, and stiffness and slowness after 3 PM. He was taking Azilect once daily and brand-name Mirapex long-acting. He was taking Sinemet half a pill 3 times a day. He continued to be very active and take care of his elderly mother, 14 years old, alternating her care with his brother. I suggested he continue with Azilect once daily and Mirapex ER but he increase Sinemet to half a pill at 7 AM, half a pill at 11 AM, 1 whole pill at 3 PM and half a pill at 7 PM.    I saw him on 01/05/2014, at which time he reported feeling worse with his posture and his walking was slower. He felt stable with his mood and memory. He had mild constipation. He continued to work here and there. He reported no issues with driving. His mother continue to stay with him every other week and he was sharing care of her with his brother. His mother has dementia and is in her mid 10s. I suggested he continue with Azilect and Mirapex ER at the same doses but and a half pill of levodopa to his regimen.     I saw him on 06/16/2013, at which time he reported feeling stable, with the exception of occasional start hesitation and trouble turning. I encouraged him to increase his exercise to more daily walking exercises and kept his medications the same.   I first met him on 12/12/2012, at which time I felt that his exam was stable. I did not make any medication changes at the time but suggested he consider taking coenzyme Q 10.     He previously followed by Dr. Morene Antu and was last seen by him on 06/14/2012, and which time Dr. Erling Cruz felt that he was doing very well. He did not make any changes to his medication regimen.   He was diagnosed with idiopathic right-sided predominant Parkinson's disease in May 2007 and initially treated with long-acting Mirapex, rasagiline and levodopa. He exercises regularly. He has had some daytime somnolence. He snores at night. In February 2013  he went on disability. He denied compulsive behavior, orthostatic hypotension or edema. He has had some difficulty with his memory. His mother has dementia at age 56. He lives alone, and takes care of his mother, who stays with him one week and with his only brother one week. He stays active and tends to sleep better when his mother stays with his brother as he can rest more and relax more. He has had no depression, anxiety, hallucinations, delusions or major memory problems, or dyskinesias.    His Past Medical History Is Significant For: Past Medical History:  Diagnosis Date  . Hypertension   . Parkinson disease [facility])     His Past Surgical History  Is Significant For: Past Surgical History:  Procedure Laterality Date  . HERNIA REPAIR      His Family History Is Significant For: Family History  Problem Relation Age of Onset  . Heart attack Father     His Social History Is Significant For: Social History   Social History  . Marital status: Single    Spouse name: N/A  . Number of children: 3  . Years of education: 12th   Occupational History  . N/A     retired   Social History Main Topics  . Smoking status: Never Smoker  . Smokeless tobacco: Former Systems developer    Types: Chew     Comment: quit 8 years ago  . Alcohol use No  . Drug use: No  . Sexual activity: Not Asked   Other Topics Concern  . None   Social History Narrative   Patient is right handed, resides alone, mother resides with him 1/2 the time,has dementia    His Allergies Are:  No Known Allergies:   His Current Medications Are:  Outpatient Encounter Prescriptions as of 09/27/2016  Medication Sig  . carbidopa-levodopa (SINEMET IR) 25-100 MG tablet 1/2 pill at 7 AM, 2 pill at 11 AM, 1 pill at 3 PM and 1/2 pill at 7 PM.  . Pramipexole Dihydrochloride (MIRAPEX ER) 3 MG TB24 Take 1 tablet (3 mg total) by mouth daily.  . rasagiline (AZILECT) 1 MG TABS tablet Take 1 tablet (1 mg total) by mouth daily.   No  facility-administered encounter medications on file as of 09/27/2016.   :  Review of Systems:  Out of a complete 14 point review of systems, all are reviewed and negative with the exception of these symptoms as listed below: Review of Systems  Neurological:       No new concerns today  Patient is taking Sinemet: 1/2 - 1- 1- 1/2    Objective:  Neurologic Exam  Physical Exam Physical Examination:   Vitals:   09/27/16 0936  BP: 132/64  Pulse: 68  Resp: 14   General Examination: The patient is a very pleasant 66 y.o. male in no acute distress. He appears well-developed and well-nourished and well groomed.   HEENT: Normocephalic, atraumatic, pupils are equal, round and reactive to light and accommodation. Extraocular tracking shows mild saccadic breakdown without nystagmus noted. There is limitation to upper gaze. There is mild decrease in eye blink rate. Hearing is intact. Face is symmetric with mild facial masking and normal facial sensation. There is no lip, neck or jaw tremor. Neck is mild to moderately rigid with intact passive ROM. There is a slight left head tilt, stable. Oropharynx exam reveals mild mouth dryness. No significant airway crowding is noted. Mallampati is class II. Tongue protrudes centrally and palate elevates symmetrically.   There is no drooling.   Chest: is clear to auscultation without wheezing, rhonchi or crackles noted.  Heart: sounds are regular and normal without murmurs, rubs or gallops noted.   Abdomen: is soft, non-tender and non-distended with normal bowel sounds appreciated on auscultation.  Extremities: There is no pitting edema in the distal lower extremities bilaterally. Pedal pulses are intact.   Skin: is warm and dry with no trophic changes noted.    Musculoskeletal: exam reveals no obvious joint deformities, tenderness, joint swelling or erythema.  Neurologically:  Mental status: The patient is awake and alert, paying good  attention.  He is able to completely provide the history. He is oriented to: person, place, time/date,  situation, day of week, month of year and year. His memory, attention, language and knowledge are intact. There is no aphasia, agnosia, apraxia or anomia. There is noree of bradyphrenia. Speech is mildly hypophonic with no dysarthria noted. Mood is congruent and affect is normal.    Cranial nerves are as described above under HEENT exam. In addition, shoulder shrug is normal but L shoulder is slightly lower today.   Motor exam: Normal bulk, and strength for age is noted. There are no dyskinesias noted.    Tone is mildly rigid with presence of cogwheeling in the right upper extremity. There is overall mild to moderate bradykinesia. There is no drift or rebound. There is no tremor today.  Romberg is negative.  Reflexes are 1+ in the upper extremities and 1+ in the lower extremities.   Fine motor skills exam: Fine motor skills are moderately impaired on the right and slightly better on the left, fairly stable.  Cerebellar testing shows no dysmetria or intention tremor on finger to nose testing. Heel to shin is unremarkable bilaterally. There is no truncal or gait ataxia.   Sensory exam is intact to light touch in the UEs and LEs.  Gait, station and balance: He stands up from the seated position with minimal difficulty and does not need to push up with His hands. He needs no assistance. No veering to one side is noted. He is not noted to lean to the side. Posture is moderately stooped for age, slightly worse than last time. Stance is narrow based. He walks with fairly good pace, decreased stride length and decreased arm swing on the right, balance is fairly well-preserved. He turns with slight insecurity today.  Assessment and Plan:   In summary, Jack Cobb is a very pleasant 66 year old male with an underlying medical history of hypertension and chest pain, status post hernia repair in 2012, who  presents for followup consultation of his right-sided predominant Parkinson's disease, akinetic-rigid type, with symptoms and diagnosis going back to 2007, symptoms perhaps dating back to 2006. With time, he has had very mild progression, has remained stable for years in the past. He has slowly increased his Mirapex and also his Sinemet. His physical exam has remained fairly stable for quite some time in the past, but he has had some progression in his posture and stiffness with time. He has had some worsening of his posture as well. Overall, however, he has done rather well, stays active physically, but is encouraged to exercise on a regular basis. His girlfriend is looking into the PD boxing class and [place]. He has stable mood, memory and sleep. No recent falls thankfully.I suggested we continue with generic Azilect once daily, Mirapex ER 3 mg once daily and try to increase Sinemet generic to 1 pill 5 times a day. We again talked about potential side effects with levodopa and expectations, he is worried about long-term effects of levodopa and side effects. He is advised to follow-up with me in 6 months, sooner if the need arises. I answered all their questions today. Patient was in agreement with the plan.  I spent 25 minutes in total face-to-face time with the patient, more than 50% of which was spent in counseling and coordination of care, reviewing test results, reviewing medication and discussing or reviewing the diagnosis of PD, its prognosis and treatment options. Pertinent laboratory and imaging test results that were available during this visit with the patient were reviewed by me and considered in my medical decision  making (see chart for details).

## 2016-10-09 ENCOUNTER — Telehealth: Payer: Self-pay

## 2016-10-09 NOTE — Telephone Encounter (Signed)
I received additional ppw which I have filled out and faxed back in .

## 2016-10-09 NOTE — Telephone Encounter (Signed)
PA started for Mirapex ER 3mg . Submitted today on CoverMyMeds. Key [id number]. Waiting for response back.

## 2016-10-09 NOTE — Telephone Encounter (Signed)
PA denied, starting appeal process.

## 2016-10-18 NOTE — Telephone Encounter (Signed)
I called Appeals Department to check on status.  Medication approved 10/09/2016-05/21/2017.  Authorization number: [id number] Case#: [id number]  I will fax information to pharmacy.

## 2017-01-29 ENCOUNTER — Telehealth: Payer: Self-pay | Admitting: Neurology

## 2017-01-29 DIAGNOSIS — G20 Parkinson's disease: Secondary | ICD-10-CM

## 2017-01-29 MED ORDER — PRAMIPEXOLE DIHYDROCHLORIDE ER 3 MG PO TB24: 3.0000 mg | ORAL_TABLET | Freq: Every day | ORAL | 1 refills | Status: DC

## 2017-01-29 NOTE — Telephone Encounter (Signed)
Pt called in he is requesting a refill for  Pramipexole Dihydrochloride (MIRAPEX ER) 3 MG TB24 sent to [facility]/[place].

## 2017-01-29 NOTE — Addendum Note (Signed)
Addended by: Hillis RangeKING, Nikira Kushnir L on: 01/29/2017 03:23 PM   Modules accepted: Orders

## 2017-01-29 NOTE — Telephone Encounter (Signed)
E-scribed refills to [facility] per pt request.  Patient has follow up with Dr Frances FurbishAthar on 04/02/17

## 2017-04-02 ENCOUNTER — Ambulatory Visit: Payer: Medicare Other | Admitting: Neurology

## 2017-04-02 ENCOUNTER — Encounter: Payer: Self-pay | Admitting: Neurology

## 2017-04-02 DIAGNOSIS — G20 Parkinson's disease: Secondary | ICD-10-CM | POA: Diagnosis not present

## 2017-04-02 MED ORDER — RASAGILINE MESYLATE 1 MG PO TABS: 1.0000 mg | ORAL_TABLET | Freq: Every day | ORAL | 5 refills | Status: DC

## 2017-04-02 NOTE — Patient Instructions (Signed)
We will continue with your medications, with the exception of changing the Mirapex ER 3 mg to Mirapex generic 1 mg 3 times a day, when you need the next prescription. Please let us know.

## 2017-04-02 NOTE — Progress Notes (Signed)
Subjective:    Patient ID: Jack Cobb is a 66 y.o. male.  HPI     Interim history:   Mr. 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 He is unaccompanied today. I last saw him on 09/27/2013, at which time he reported doing okay but felt like his levodopa was wearing off after 3 hours. He was taking half a pill in the morning, 1 pill midday, then another full pill about 3-4 hours later and half a pill in the evening. Is not exercising on a regular basis but overall was fairly active. He had some constipation intermittently. Memory was stable, some forgetfulness reported, some mood irritability was reported by his girlfriend. He was getting his Mirapex ER brand-name from [place]. He was taking Azilect generic once daily. I suggested we continue with Mirapex ER 3 mg daily and Azilect 1 mg daily. I suggested we increase his Sinemet to 1 pill 5 times a day. I asked him to exercise more regularly and be proactive about constipation issues.   Today, 04/02/2017 (all dictated new, as well as above notes, some dictation done in note pad or Word, outside of chart, may appear as copied):  He reports doing okay generally speaking. Some constipation. Stays active, may not always drink enough water. He does not actually go to the gym. No cognitive issues, no falls, but pulled a muscle in the R hip area.    The patient's allergies, current medications, family history, past medical history, past social history, past surgical history and problem list were reviewed and updated as appropriate.    Previously (copied from previous notes for reference):   Of note, the patient no showed for appointment on 07/12/2016 secondary to illness. I saw him on 01/10/2016, at which time he  reported worsening of his posture in more stiffness. He had no significant problems with balance or constipation or mood. He was taking overall a low dose of Sinemet. I suggested he could continue with the current regimen but increase his second dose also to a whole pill. He was taking half a pill for 3 doses and 1 whole pill at 3 PM. I suggested he could try to increase his Sinemet. Overall, he felt fairly stable but I did notice some changes on exam as well.   I saw him on 07/12/2015, at which time he reported feeling stable. He did not notice much in the way of difference after the increase in the Mirapex long-acting. Overall, he was less stressed. He was able to transition his mother took class nursing home and she did quite well given her age of 27. He was sleeping a little better not having to worry about his mother on the time. He was taking Sinemet half a pill 4 times a day sometimes a whole pill at 3 PM, sometimes would get only 3 doses and for the day. He is trying to stay active and trying to do better with his water intake as opposed to coffee and soda. I suggested he could consider alternating 1 pill with half a pill for a total of 3 pills daily, but he preferred to stay on the regimen of about 2 and half pills daily.    I saw him on 01/06/2015, at which time he reported doing fairly well overall. He was active. He  had some increased stiffness particularly in the mornings and later in the evenings. He would sometimes forget his evening dose of Sinemet. He still had some reservations about taking Sinemet because of fear of potential long-term effects and side effects. We mutually agreed to keep his Sinemet at 3 half pills a day for 3 doses, and one whole pill around 3 PM. He was advised to continue with Azilect once daily as well. I suggested he increase his Mirapex ER from 2.25 mg to 3 mg once daily. He was on brand-name Mirapex ER.   I saw him on 07/08/2014, at which time he reported more  tremors, especially towards the end of the day. He reported more fatigue, and stiffness and slowness after 3 PM. He was taking Azilect once daily and brand-name Mirapex long-acting. He was taking Sinemet half a pill 3 times a day. He continued to be very active and take care of his elderly mother, 66 years old, alternating her care with his brother. I suggested he continue with Azilect once daily and Mirapex ER but he increase Sinemet to half a pill at 7 AM, half a pill at 11 AM, 1 whole pill at 3 PM and half a pill at 7 PM.    I saw him on 01/05/2014, at which time he reported feeling worse with his posture and his walking was slower. He felt stable with his mood and memory. He had mild constipation. He continued to work here and there. He reported no issues with driving. His mother continue to stay with him every other week and he was sharing care of her with his brother. His mother has dementia and is in her mid 40s. I suggested he continue with Azilect and Mirapex ER at the same doses but and a half pill of levodopa to his regimen.     I saw him on 06/16/2013, at which time he reported feeling stable, with the exception of occasional start hesitation and trouble turning. I encouraged him to increase his exercise to more daily walking exercises and kept his medications the same.   I first met him on 12/12/2012, at which time I felt that his exam was stable. I did not make any medication changes at the time but suggested he consider taking coenzyme Q 10.     He previously followed by Dr. Morene Antu and was last seen by him on 06/14/2012, and which time Dr. Erling Cruz felt that he was doing very well. He did not make any changes to his medication regimen.   He was diagnosed with idiopathic right-sided predominant Parkinson's disease in May 2007 and initially treated with long-acting Mirapex, rasagiline and levodopa. He exercises regularly. He has had some daytime somnolence. He snores at night. In February 2013  he went on disability. He denied compulsive behavior, orthostatic hypotension or edema. He has had some difficulty with his memory. His mother has dementia at age 26. He lives alone, and takes care of his mother, who stays with him one week and with his only brother one week. He stays active and tends to sleep better when his mother stays with his brother as he can rest more and relax more. He has had no depression, anxiety, hallucinations, delusions or major memory problems, or dyskinesias.    His Past Medical History Is Significant For: Past Medical History:  Diagnosis Date  . Hypertension   . Parkinson disease ([place])     His Past Surgical History Is Significant For: Past Surgical History:  Procedure Laterality Date  . HERNIA REPAIR      His Family History Is Significant For: Family History  Problem Relation Age of Onset  . Heart attack Father     His Social History Is Significant For: Social History   Socioeconomic History  . Marital status: Single    Spouse name: None  . Number of children: 3  . Years of education: 12th  . Highest education level: None  Social Needs  . Financial resource strain: None  . Food insecurity - worry: None  . Food insecurity - inability: None  . Transportation needs - medical: None  . Transportation needs - non-medical: None  Occupational History  . Occupation: N/A    Comment: retired  Tobacco Use  . Smoking status: Never Smoker  . Smokeless tobacco: Former Systems developer    Types: Chew  . Tobacco comment: quit 8 years ago  Substance and Sexual Activity  . Alcohol use: No  . Drug use: No  . Sexual activity: None  Other Topics Concern  . None  Social History Narrative   Patient is right handed, resides alone, mother resides with him 1/2 the time,has dementia    His Allergies Are:  No Known Allergies:   His Current Medications Are:  Outpatient Encounter Medications as of 04/02/2017  Medication Sig  . carbidopa-levodopa (SINEMET IR) 25-100  MG tablet Take 1 tablet by mouth 5 (five) times daily.  . Pramipexole Dihydrochloride (MIRAPEX ER) 3 MG TB24 Take 1 tablet (3 mg total) by mouth daily. Brand name Mirapex ER  . rasagiline (AZILECT) 1 MG TABS tablet Take 1 tablet (1 mg total) by mouth daily.   No facility-administered encounter medications on file as of 04/02/2017.   : Review of Systems:  Out of a complete 14 point review of systems, all are reviewed and negative with the exception of these symptoms as listed below:  Review of Systems  Neurological:       Pt presents today to discuss his PD. Pt denies any recent falls but does report having pulled a muscle in his hip.    Objective:  Neurological Exam  Physical Exam Physical Examination:   Vitals:   04/02/17 0918  BP: 137/85  Pulse: 66   General Examination: The patient is a very pleasant 66 y.o. male in no acute distress. He appears well-developed and well-nourished and well groomed.   HEENT:Normocephalic, atraumatic, pupils are equal, round and reactive to light and accommodation. Extraocular tracking shows mild saccadic breakdown without nystagmus noted. There is limitation to upper gaze. There is mild decrease in eye blink rate. Hearing is intact. Face is symmetric with mild facial masking and normal facial sensation. There is no lip, neck or jaw tremor. Neck is mild to moderately rigid with intact passive ROM. There is a slight left head tilt, stable. Oropharynx exam reveals mild mouth dryness. No significant airway crowding is noted. Mallampati is class II. Tongue protrudes centrally and palate elevates symmetrically.  There is no drooling.   Chest:is clear to auscultation without wheezing, rhonchi or crackles noted.  Heart:sounds are regular and normal without murmurs, rubs or gallops noted.   Abdomen:is soft, non-tender and non-distended with normal bowel sounds appreciated on auscultation.  Extremities:There is no pitting edema in the distal lower  extremities bilaterally. Pedal pulses are intact.   Skin: is warm and dry with no trophic changes noted.   Musculoskeletal: exam reveals no obvious joint deformities, tenderness, joint swelling or erythema.  Neurologically:  Mental status: The  patient is awake and alert, paying good attention. He is able to completely provide the history. He is oriented to: person, place, time/date, situation, day of week, month of year and year. His memory, attention, language and knowledge are intact. There is no aphasia, agnosia, apraxia or anomia. There is no bradyphrenia. Speech is mildly hypophonic with no dysarthria noted. Mood is congruent and affect is normal.   Cranial nerves are as described above under HEENT exam. In addition, shoulder shrug is normal but L shoulder is slightly lower today.   Motor exam: Normal bulk, and strength for age is noted. There are no dyskinesias noted.   Tone is mildly rigid with presence of cogwheeling in the right upper extremity. There is overall mild to moderate bradykinesia. There is no drift or rebound. There is no tremor today.  Romberg is negative.   Reflexes are 1+ in the upper extremities and 1+ in the lower extremities.  Fine motor skills exam:Fine motor skills are moderately impaired on the right and slightly better on the left, fairly stable.  Cerebellar testing shows no dysmetria or intention tremor on finger to nose testing. Heel to shin is unremarkable bilaterally. There is no truncal or gait ataxia.   Sensory exam is intact to light touch in the UEs and LEs.  Gait, station and balance: He stands up from the seated position with minimal difficulty and does not need to push up with His hands. He needs no assistance. No veering to one side is noted. He is not noted to lean to the side. Posture is moderately stooped for age, stable. Stance is narrow based. He walks with fairly good pace, decreased stride length and decreased arm swing on the  right, balance is fairly well-preserved. He turns with slight insecurity in about 3 steps.  Assessment and Plan:   In summary, DECKER COGDELL is a very pleasant 66 year old male with an underlying medical history of hypertension and chest pain, status post hernia repair in 2012, who presents for followup consultation of his right-sided predominant Parkinson's disease, akinetic-rigid type, with diagnosis dating back to 2007, symptoms perhaps dating back to 2006. With time, he has had very mild progression, has remained stable for years in the past. We have with time increased his Mirapex and also his Sinemet. His physical exam has remained fairly stable for quite some time in the past, but he has had some progression in his posture and stiffness with time. He recently pulled a muscle in the R hip. Overall, however, he has done rather well, stays active physically, but is encouraged to exercise on a regular basis and increase his water intake. He has stable mood, memory and sleep. No recent falls thankfully. I suggested we continue with generic Azilect once daily, Mirapex ER 3 mg once daily and Sinemet generic 1 pill 5 times a day, which I increased in May 2018. We may have to change his Mirapex ER 3 mg prescription to Mirapex generic IR 1 mg 3 times a day because of cost. He will let us know as he still has about 3 or 4 months worth at home. He did not need a prescription refill on Sinemet and I renewed his prescription for Azilect. I suggested a 6 month follow-up, sooner as needed. I answered all his questions today and he was in agreement. again talked about potential side effects with levodopa and expectations, he is worried about long-term effects of levodopa and side effects. He is advised to follow-up with me  in 6 months, sooner if the need arises. I answered all their questions today. Patient was in agreement with the plan.  I spent 25 minutes in total face-to-face time with the patient, more than 50%  of which was spent in counseling and coordination of care, reviewing test results, reviewing medication and discussing or reviewing the diagnosis of PD, its prognosis and treatment options. Pertinent laboratory and imaging test results that were available during this visit with the patient were reviewed by me and considered in my medical decision making (see chart for details).

## 2017-05-09 ENCOUNTER — Other Ambulatory Visit: Payer: Self-pay | Admitting: Family Medicine

## 2017-05-09 DIAGNOSIS — R52 Pain, unspecified: Secondary | ICD-10-CM

## 2017-05-11 ENCOUNTER — Ambulatory Visit
Admission: RE | Admit: 2017-05-11 | Discharge: 2017-05-11 | Disposition: A | Discharge status: Home / Self Care | Payer: Medicare Other | Source: Ambulatory Visit | Attending: Family Medicine | Admitting: Family Medicine

## 2017-05-11 DIAGNOSIS — R52 Pain, unspecified: Secondary | ICD-10-CM

## 2017-05-11 IMAGING — MR MR LUMBAR SPINE W/O CM
4 of 5 series · 26 of 48 positions shown · non-contrast
Comparison: None.

CLINICAL DATA: Low back pain radiating to the right buttock, hip
and leg, 3 months duration. Symptoms are worsening.

EXAM:
MRI LUMBAR SPINE WITHOUT CONTRAST
TECHNIQUE: Multiplanar, multisequence MR imaging of the lumbar spine was
performed. No intravenous contrast was administered.

[Series 3: T2 post-contrast · sagittal · 4.0mm · 0.59mm/px · 6 of 15 slices shown]
[im 1/15]
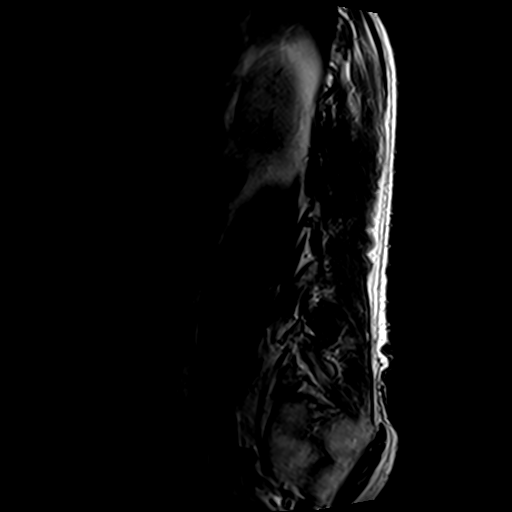
[im 3/15]
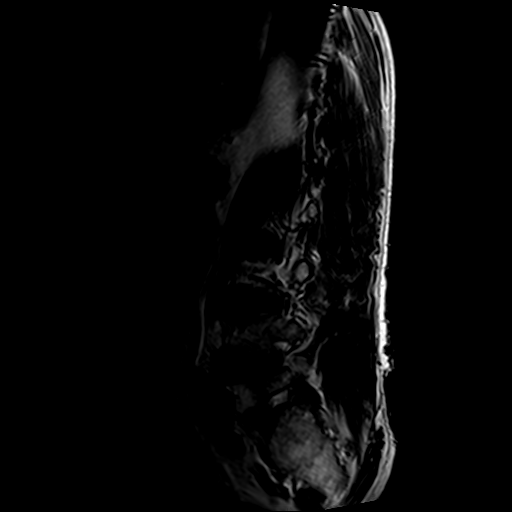
[im 6/15]
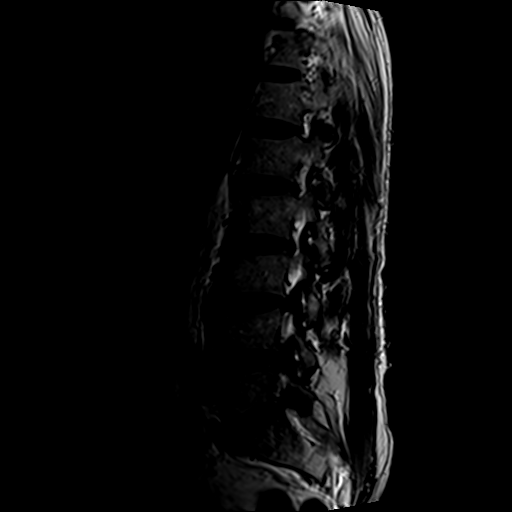
[im 9/15]
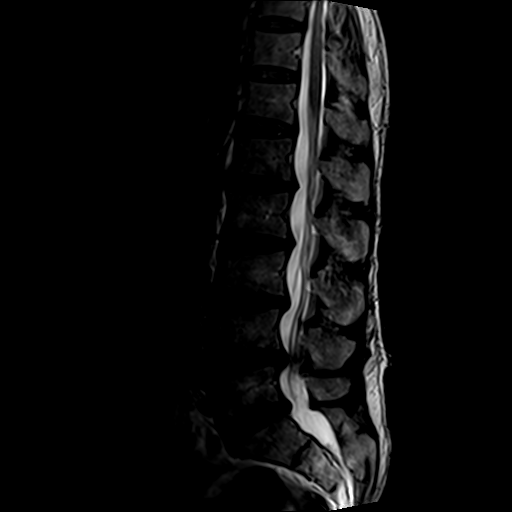
[im 12/15]
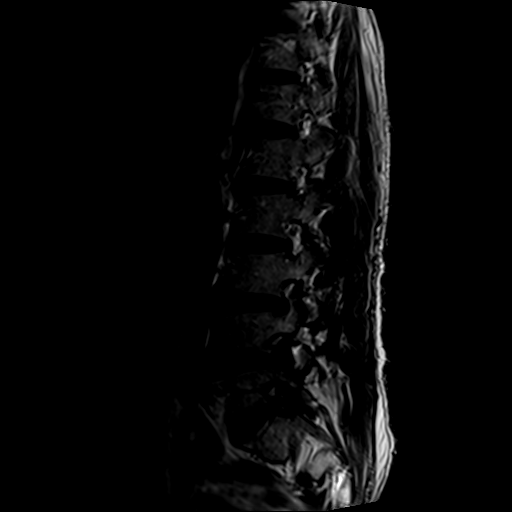
[im 15/15]
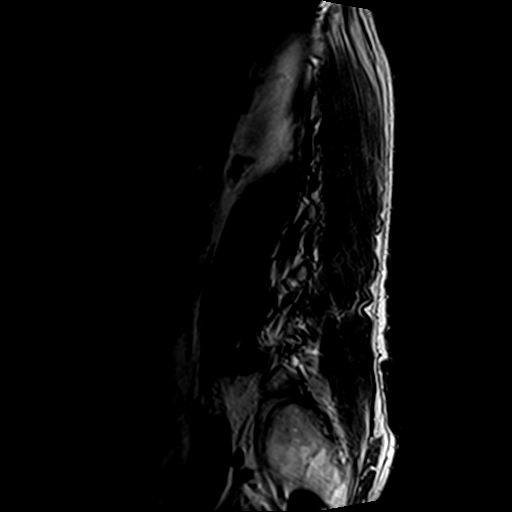

[Series 5: T1 · sagittal · 4.0mm · 0.59mm/px · 6 of 15 slices shown (1 of 2)]
[im 1/15]
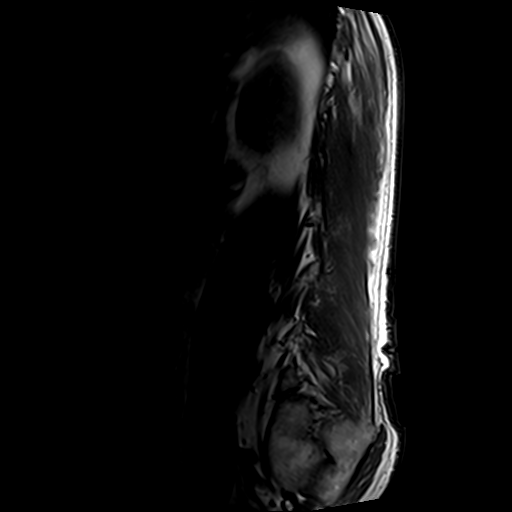
[im 3/15]
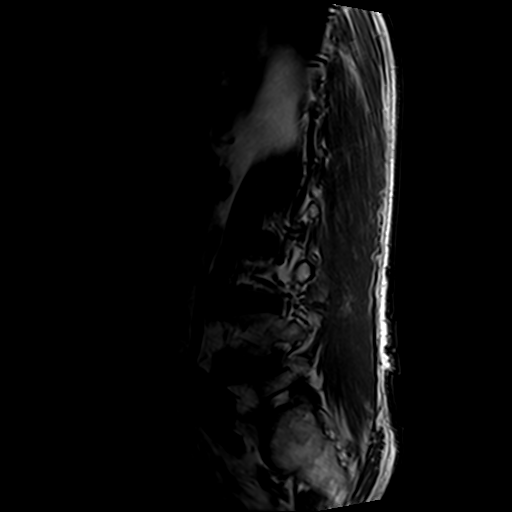
[im 6/15]
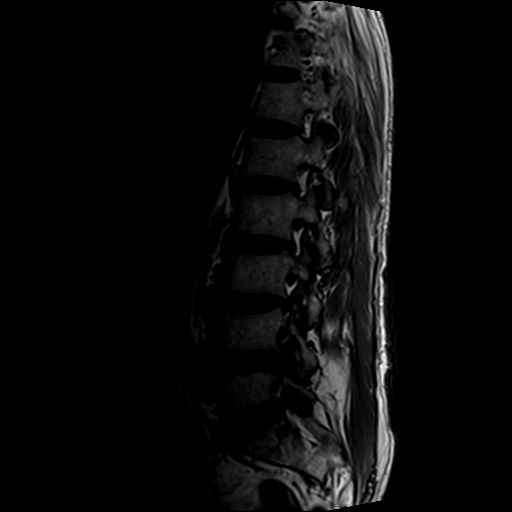
[im 9/15]
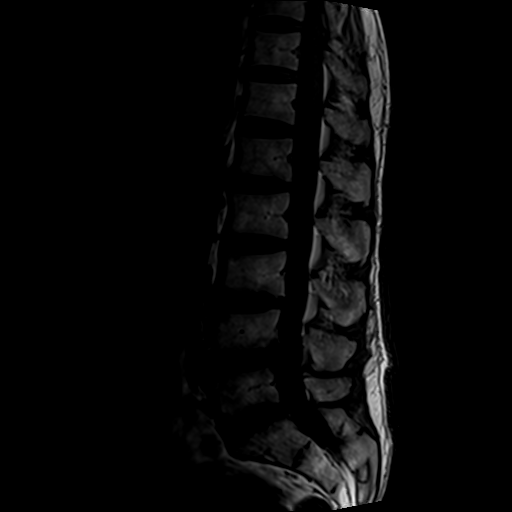
[im 12/15]
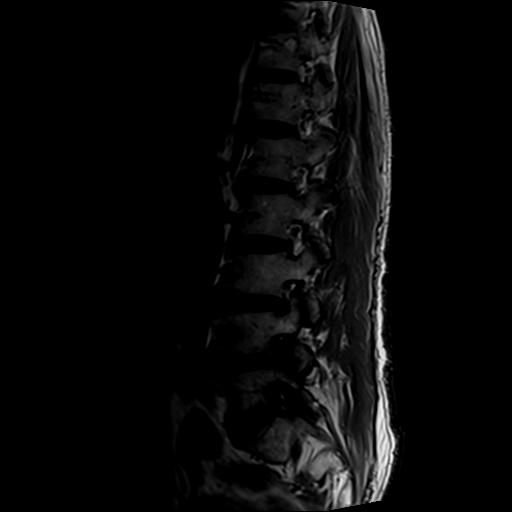
[im 15/15]
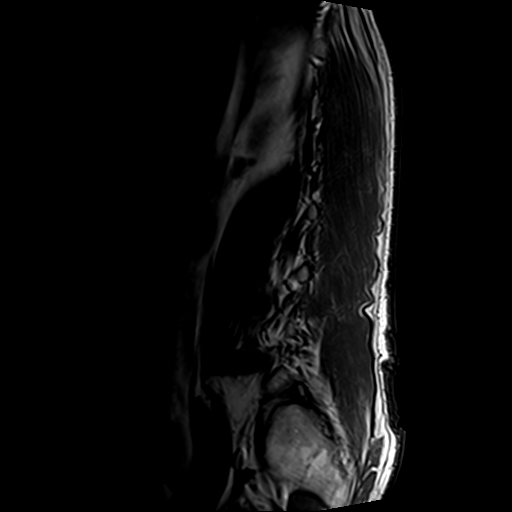

[Series 6: T1 · axial · 4.0mm · 0.35mm/px · z∈[-123,+61]mm · 5 of 40 slices shown (2 of 2)]
[im 1/40]
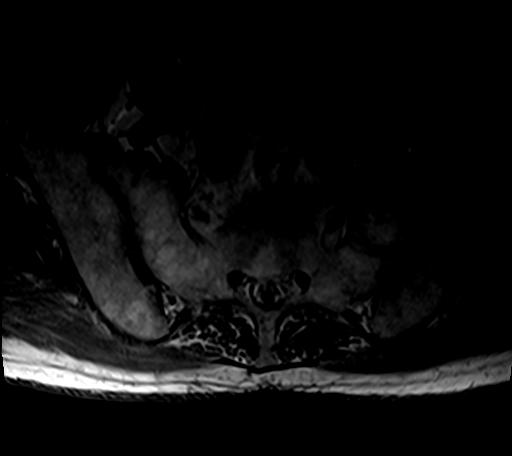
[im 6/40]
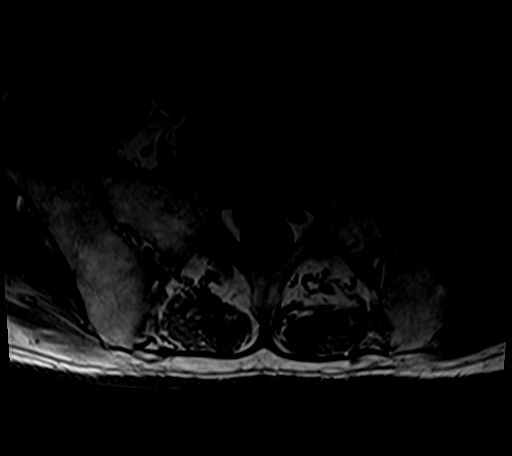
[im 12/40]
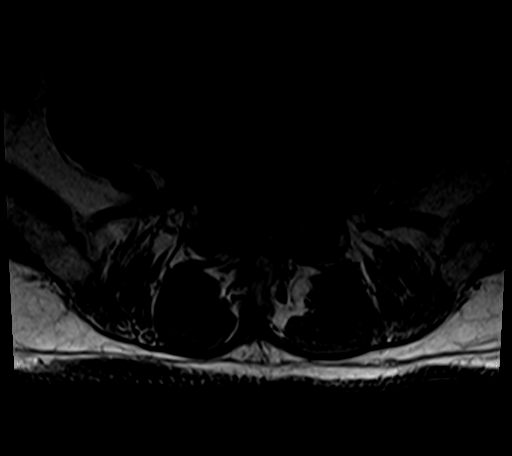
[im 20/40]
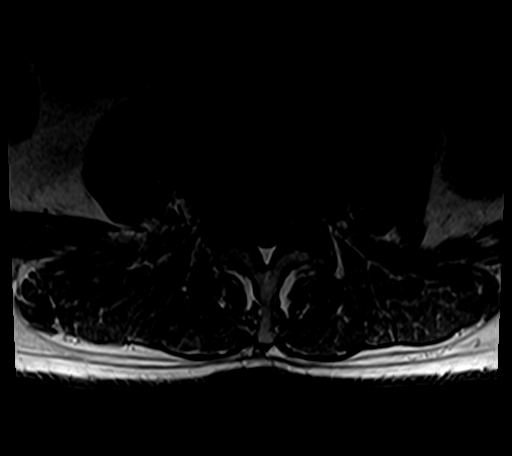
[im 34/40]
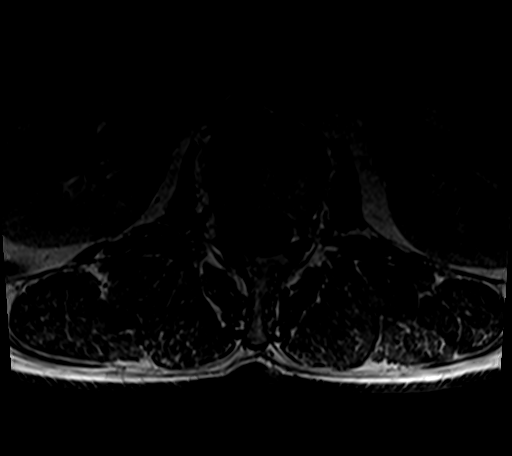

[Series 7: T2 · axial · 4.0mm · 0.70mm/px · z∈[-123,+92]mm · 9 of 40 slices shown]
[im 1/40]
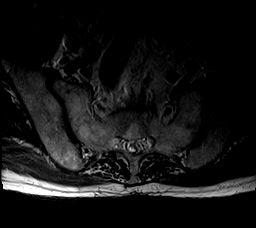
[im 6/40]
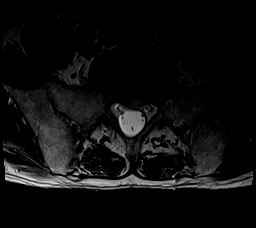
[im 12/40]
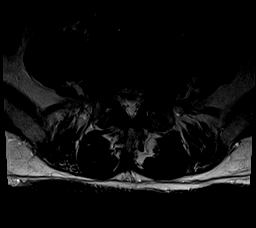
[im 17/40]
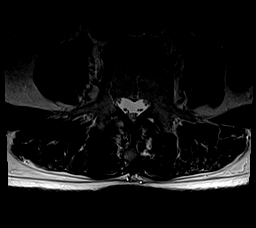
[im 20/40]
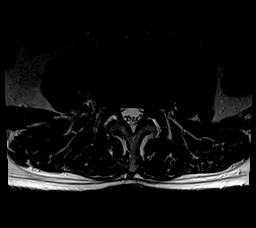
[im 23/40]
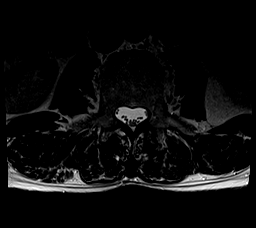
[im 28/40]
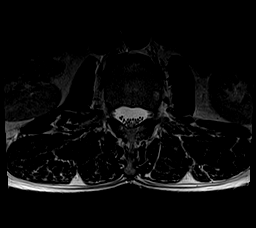
[im 34/40]
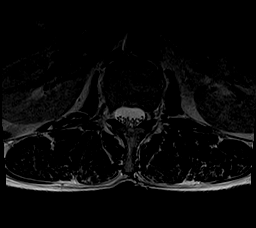
[im 40/40]
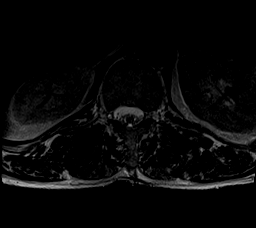

[26 of 48 positions shown; findings below may reference images not displayed]

FINDINGS: Segmentation:  5 lumbar type vertebral bodies.

Alignment:  Straightening of the normal lumbar lordosis.

Vertebrae:  No fracture or primary bone lesion.

Conus medullaris and cauda equina: Conus extends to the T12-L1
level. Conus and cauda equina appear normal.

Paraspinal and other soft tissues: Negative

Disc levels:

T12-L1:  Normal.

L1-2:  Disc bulge.  No compressive stenosis.

L2-3:  Disc bulge.  No compressive stenosis.

L3-4: Disc bulge. Bilateral facet osteoarthritis. Mild stenosis of
the lateral recesses without definite neural compression. Mild
bilateral foraminal narrowing.

L4-5: Disc bulge. Bilateral facet degeneration and hypertrophy.
Stenosis of both lateral recesses that could cause neural
compression on either or both sides. Bilateral foraminal stenosis
that could affect the exiting L4 nerves.

L5-S1: Shallow disc protrusion in the right posterolateral
direction. Bilateral facet degeneration. Mild right lateral recess
and foraminal stenosis that would have some potential to be
symptomatic.
IMPRESSION: L4-5: Disc bulge. Bilateral facet degeneration and hypertrophy.
Stenosis of both lateral recesses and neural foramina that could
cause neural compression on either or both sides.

L5-S1: Shallow right-sided disc protrusion. Mild facet degeneration
and hypertrophy. Mild stenosis of the right lateral recess and
intervertebral foramen on the right that could possibly be
symptomatic.

L3-4: Disc bulge. Facet degeneration. Mild bilateral lateral recess
stenosis.

## 2017-08-06 NOTE — Telephone Encounter (Signed)
Error

## 2017-08-24 ENCOUNTER — Telehealth: Payer: Self-pay | Admitting: Neurology

## 2017-08-24 DIAGNOSIS — G20 Parkinson's disease: Secondary | ICD-10-CM

## 2017-08-24 MED ORDER — PRAMIPEXOLE DIHYDROCHLORIDE 1 MG PO TABS: 1.0000 mg | ORAL_TABLET | Freq: Three times a day (TID) | ORAL | 3 refills | Status: DC

## 2017-08-24 NOTE — Telephone Encounter (Signed)
Will change to mirapex IR gen. 1 mg tid.

## 2017-08-24 NOTE — Telephone Encounter (Signed)
I called pt. The extended release mirapex is too expensive for him now. He is asking to be changed to the IR version of mirapex, where he would take it several times daily.  Will send to Dr. Frances FurbishAthar for review and approval. RX should go to [facility].

## 2017-08-24 NOTE — Addendum Note (Signed)
Addended by: Huston FoleyATHAR, Marianne Golightly on: 08/24/2017 09:24 AM   Modules accepted: Orders

## 2017-08-24 NOTE — Telephone Encounter (Signed)
I called pt and advised him of the mirapex change, pt verbalized understanding.

## 2017-08-24 NOTE — Telephone Encounter (Signed)
Pt called Pramipexole Dihydrochloride (MIRAPEX ER) 3 MG TB24 will cost him $200, (he was paying $15) he said [facility] and is unable to get it. He is wanting to go back to plain mirapex. Please call to advise. He has 2 weeks of med left. Pharmacy: [facility] Pt is aware the clinic closes at noon today

## 2017-10-01 ENCOUNTER — Ambulatory Visit: Payer: Medicare Other | Admitting: Neurology

## 2017-10-01 ENCOUNTER — Encounter: Payer: Self-pay | Admitting: Neurology

## 2017-10-01 VITALS — BP 143/85 | HR 66 | Ht 70.0 in | Wt 159.0 lb

## 2017-10-01 DIAGNOSIS — G20 Parkinson's disease: Secondary | ICD-10-CM

## 2017-10-01 DIAGNOSIS — G20A1 Parkinson's disease without dyskinesia, without mention of fluctuations: Secondary | ICD-10-CM

## 2017-10-01 MED ORDER — RASAGILINE MESYLATE 1 MG PO TABS: 1.0000 mg | ORAL_TABLET | Freq: Every day | ORAL | 3 refills | Status: DC

## 2017-10-01 MED ORDER — CARBIDOPA-LEVODOPA 25-100 MG PO TABS: 1.0000 | ORAL_TABLET | Freq: Every day | ORAL | 3 refills | Status: DC

## 2017-10-01 NOTE — Progress Notes (Signed)
Subjective:    Patient ID: Jack Cobb is a 67 y.o. male.  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 He is unaccompanied today. I last saw him on 04/02/2017, at which time he had issues with constipation, no cognitive issues, no recent falls, he did report that he may have pulled a muscle in the right hip area. We mutually agreed to continue his current medication regimen of Azilect once daily, Mirapex ER once daily and Sinemet 1 pill 5 times a day.  He called in the interim reporting that the Mirapex ER was to expensive and we switched him over to Mirapex IR 1 mg 3 times a day.  Today, 10/01/2017 (all dictated new, as well as above notes, some dictation done in note pad or Word, outside of chart, may appear as copied):  He reports doing okay from a Parkinson's standpoint, however, he started having more more problems with shooting pains to the right leg and had an MRI in December. I reviewed the results from his lumbar spine MRI without contrast from 05/11/2017: IMPRESSION: L4-5: Disc bulge. Bilateral facet degeneration and hypertrophy. Stenosis of both lateral recesses and neural foramina that could cause neural compression on either or both sides.   L5-S1: Shallow right-sided disc protrusion. Mild facet degeneration and hypertrophy. Mild stenosis of the right lateral recess and intervertebral foramen on the right that could possibly be symptomatic.   L3-4: Disc bulge. Facet degeneration. Mild bilateral lateral recess stenosis.   He has since then started seeing a spine specialist. He has received injections which have provided temporary relief. He is due for another injection this week. He may need surgery next. He  feels quite debilitated by the pain which fluctuates. Sometimes she is not able to walk at all. He has found that levodopa has been helpful in particular when he is in pain at night, it seems to relieve for muscle spasms. He has transitioned to generic Mirapex IR 1 mg 3 times a day. He continues to take Azilect 1 mg once daily generic. He has been on Sinemet 1 pill 5 times a day which he takes fairly consistently. He has taken the occasional half pill when he has severe back spasms. Otherwise he has been stable from a medical standpoint. He tries to be proactive with constipation, tries to hydrate well, no recent falls thankfully.   The patient's allergies, current medications, family history, past medical history, past social history, past surgical history and problem list were reviewed and updated as appropriate.    Previously (copied from previous notes for reference):   I saw him on 09/27/2016, at which time he reported doing okay but felt like his levodopa was wearing off after 3 hours. He was taking half a pill in the morning, 1 pill midday, then another full pill about 3-4 hours later and half a pill in the evening. Is not exercising on a regular basis but overall was fairly active. He had some constipation intermittently. Memory was stable, some forgetfulness reported, some mood irritability was reported by his girlfriend. He was getting his Mirapex ER brand-name from [place]. He was taking Azilect generic once daily. I suggested we continue with Mirapex ER 3 mg daily and Azilect 1 mg daily. I suggested we increase  his Sinemet to 1 pill 5 times a day. I asked him to exercise more regularly and be proactive about constipation issues.    Of note, the patient no showed for appointment on 07/12/2016 secondary to illness. I saw him on 01/10/2016, at which time he reported worsening of his posture in more stiffness. He had no significant problems with balance or constipation or mood. He was taking overall  a low dose of Sinemet. I suggested he could continue with the current regimen but increase his second dose also to a whole pill. He was taking half a pill for 3 doses and 1 whole pill at 3 PM. I suggested he could try to increase his Sinemet. Overall, he felt fairly stable but I did notice some changes on exam as well.   I saw him on 07/12/2015, at which time he reported feeling stable. He did not notice much in the way of difference after the increase in the Mirapex long-acting. Overall, he was less stressed. He was able to transition his mother took class nursing home and she did quite well given her age of 69. He was sleeping a little better not having to worry about his mother on the time. He was taking Sinemet half a pill 4 times a day sometimes a whole pill at 3 PM, sometimes would get only 3 doses and for the day. He is trying to stay active and trying to do better with his water intake as opposed to coffee and soda. I suggested he could consider alternating 1 pill with half a pill for a total of 3 pills daily, but he preferred to stay on the regimen of about 2 and half pills daily.    I saw him on 01/06/2015, at which time he reported doing fairly well overall. He was active. He had some increased stiffness particularly in the mornings and later in the evenings. He would sometimes forget his evening dose of Sinemet. He still had some reservations about taking Sinemet because of fear of potential long-term effects and side effects. We mutually agreed to keep his Sinemet at 3 half pills a day for 3 doses, and one whole pill around 3 PM. He was advised to continue with Azilect once daily as well. I suggested he increase his Mirapex ER from 2.25 mg to 3 mg once daily. He was on brand-name Mirapex ER.   I saw him on 07/08/2014, at which time he reported more tremors, especially towards the end of the day. He reported more fatigue, and stiffness and slowness after 3 PM. He was taking Azilect once daily and  brand-name Mirapex long-acting. He was taking Sinemet half a pill 3 times a day. He continued to be very active and take care of his elderly mother, 65 years old, alternating her care with his brother. I suggested he continue with Azilect once daily and Mirapex ER but he increase Sinemet to half a pill at 7 AM, half a pill at 11 AM, 1 whole pill at 3 PM and half a pill at 7 PM.    I saw him on 01/05/2014, at which time he reported feeling worse with his posture and his walking was slower. He felt stable with his mood and memory. He had mild constipation. He continued to work here and there. He reported no issues with driving. His mother continue to stay with him every other week and he was sharing care of her with his brother. His mother has dementia and is in her  mid 76s. I suggested he continue with Azilect and Mirapex ER at the same doses but and a half pill of levodopa to his regimen.   I saw him on 06/16/2013, at which time he reported feeling stable, with the exception of occasional start hesitation and trouble turning. I encouraged him to increase his exercise to more daily walking exercises and kept his medications the same.   I first met him on 12/12/2012, at which time I felt that his exam was stable. I did not make any medication changes at the time but suggested he consider taking coenzyme Q 10.     He previously followed by Dr. Morene Antu and was last seen by him on 06/14/2012, and which time Dr. Erling Cruz felt that he was doing very well. He did not make any changes to his medication regimen.   He was diagnosed with idiopathic right-sided predominant Parkinson's disease in May 2007 and initially treated with long-acting Mirapex, rasagiline and levodopa. He exercises regularly. He has had some daytime somnolence. He snores at night. In February 2013 he went on disability. He denied compulsive behavior, orthostatic hypotension or edema. He has had some difficulty with his memory. His mother has  dementia at age 39. He lives alone, and takes care of his mother, who stays with him one week and with his only brother one week. He stays active and tends to sleep better when his mother stays with his brother as he can rest more and relax more. He has had no depression, anxiety, hallucinations, delusions or major memory problems, or dyskinesias.    His Past Medical History Is Significant For: Past Medical History:  Diagnosis Date  . Hypertension   . Parkinson disease ([place])     His Past Surgical History Is Significant For: Past Surgical History:  Procedure Laterality Date  . HERNIA REPAIR      His Family History Is Significant For: Family History  Problem Relation Age of Onset  . Heart attack Father     His Social History Is Significant For: Social History   Socioeconomic History  . Marital status: Single    Spouse name: Not on file  . Number of children: 3  . Years of education: 12th  . Highest education level: Not on file  Occupational History  . Occupation: N/A    Comment: retired  Scientific laboratory technician  . Financial resource strain: Not on file  . Food insecurity:    Worry: Not on file    Inability: Not on file  . Transportation needs:    Medical: Not on file    Non-medical: Not on file  Tobacco Use  . Smoking status: Never Smoker  . Smokeless tobacco: Former Systems developer    Types: Chew  . Tobacco comment: quit 8 years ago  Substance and Sexual Activity  . Alcohol use: No  . Drug use: No  . Sexual activity: Not on file  Lifestyle  . Physical activity:    Days per week: Not on file    Minutes per session: Not on file  . Stress: Not on file  Relationships  . Social connections:    Talks on phone: Not on file    Gets together: Not on file    Attends religious service: Not on file    Active member of club or organization: Not on file    Attends meetings of clubs or organizations: Not on file    Relationship status: Not on file  Other Topics Concern  . Not  on file   Social History Narrative   Patient is right handed, resides alone, mother resides with him 1/2 the time,has dementia    His Allergies Are:  No Known Allergies:   His Current Medications Are:  Outpatient Encounter Medications as of 10/01/2017  Medication Sig  . carbidopa-levodopa (SINEMET IR) 25-100 MG tablet Take 1 tablet by mouth 5 (five) times daily.  . pramipexole (MIRAPEX) 1 MG tablet Take 1 tablet (1 mg total) by mouth 3 (three) times daily.  . rasagiline (AZILECT) 1 MG TABS tablet Take 1 tablet (1 mg total) daily by mouth.   No facility-administered encounter medications on file as of 10/01/2017.   : Review of Systems:  Out of a complete 14 point review of systems, all are reviewed and negative with the exception of these symptoms as listed below:  Review of Systems  Neurological:       Pt presents today to discuss his PD.    Objective:  Neurological Exam  Physical Exam Physical Examination:   Vitals:   10/01/17 0827  BP: (!) 143/85  Pulse: 66   General Examination: The patient is a very pleasant 67 y.o. male in no acute distress. He appears well-developed and well-nourished and well groomed.   HEENT:Normocephalic, atraumatic, pupils are equal, round and reactive to light and accommodation. Extraocular tracking shows mild saccadic breakdown without nystagmus noted. There is limitation to upper gaze. There is mild decrease in eye blink rate. Hearing is intact. Face is symmetric with mild facial masking and normal facial sensation. There is no lip, neck or jaw tremor. Neck is mild to moderately rigid with intact passive ROM. There is a slight left head tilt, stable.Oropharynx exam reveals mild mouth dryness. No significant airway crowding is noted. Mallampati is class II. Tongue protrudes centrally and palate elevates symmetrically.  There is no drooling.   Chest:is clear to auscultation without wheezing, rhonchi or crackles noted.  Heart:sounds are regular and  normal without murmurs, rubs or gallops noted.   Abdomen:is soft, non-tender and non-distended with normal bowel sounds appreciated on auscultation.  Extremities:There is no pitting edema in the distal lower extremities bilaterally. Pedal pulses are intact.   Skin: is warm and dry with no trophic changes noted.   Musculoskeletal: exam reveals no obvious joint deformities, tenderness, joint swelling or erythema.  Neurologically:  Mental status: The patient is awake and alert, paying good attention. He is able to completely provide the history. He is oriented to: person, place, time/date, situation, day of week, month of year and year. His memory, attention, language and knowledge are intact. There is no aphasia, agnosia, apraxia or anomia. There is no bradyphrenia. Speech is mildly hypophonic with no dysarthria noted. Mood is congruent and affect is normal.   Cranial nerves are as described above under HEENT exam. In addition, shoulder shrug is normal but L shoulder is slightly lower today.   Motor exam: Normal bulk, and strength for age is noted. There are no dyskinesias noted.  Tone is mildly rigid with presence of cogwheeling in the right upper extremity. There is overall mildto moderatebradykinesia. There is no drift or rebound. There is no tremor today.  Romberg is negative.   Reflexes are 1+ in the upper extremities and 1+ in the lower extremities.  Fine motor skills exam:Fine motor skills are moderately impaired on the right and slightly better on the left, fairly stable.  Cerebellar testing shows no dysmetria or intention tremor on finger to nose testing. Heel to shin is  unremarkable bilaterally. There is no truncal or gait ataxia.   Sensory exam is intact to light touch in the UEs and LEs.  Gait, station and balance: He stands up from the seated position with minimal difficulty and does not need to push up with His hands. He needs no assistance. No veering to  one side is noted. He is not noted to lean to the side. Posture is moderately stooped for age,stable. Stance is narrow based. He walks with fairly good pace, decreased stride length and decreased arm swing on the right, balance is fairly well-preserved. He turns with slight insecurity in about 3 steps.  Assessment and Plan:   In summary, Jack Cobb is a very pleasant 67 year old male with an underlying medical history of hypertension and chest pain, status post hernia repair in 2012, who presents for followup consultation of his right-sided predominant Parkinson's disease, akinetic-rigid type, with diagnosis dating back to 2007, symptoms perhaps dating back to 2006. With time, he has had very mild progression, has remained stable for years in the past. We have with time increased his Mirapex and also his Sinemet.His physical exam has remained fairly stable for quite some time in the past, but he has had some progression in his posture and stiffnesswith time. He has had issues with lower back and sciatica type symptoms, he is now established with a spine specialist, he has received injections and is due for another injection this week, he may need surgery. He is encouraged to stay active as best as he can. He is reminded to stay well hydrated with water. He is advised to continue with his current medication regimen, he is advised that he could add the occasional half a pill of Sinemet up to twice a day so I adjusted his prescription to reflect 1 pill 6 times a day. He will generally speaking continue with one pill 5 times a day. I suggested a six-month follow-up routinely, sooner if needed. I answered all his questions today and he was in agreement. I spent 20 minutes in total face-to-face time with the patient, more than 50% of which was spent in counseling and coordination of care, reviewing test results, reviewing medication and discussing or reviewing the diagnosis of PD, its prognosis and  treatment options. Pertinent laboratory and imaging test results that were available during this visit with the patient were reviewed by me and considered in my medical decision making (see chart for details).

## 2017-10-01 NOTE — Patient Instructions (Addendum)
We will continue with your Sinemet at 1 pill 5 times a day. You can take the occasional 1/2 pill up to 2 times a day.  Try to continue with your walking exercises. I hope, your back feels better soon.  Try to hydrate well.  We will keep the generic Mirapex and Azilect the same.  Use a cane as needed.

## 2018-04-03 ENCOUNTER — Encounter: Payer: Self-pay | Admitting: Neurology

## 2018-04-03 ENCOUNTER — Ambulatory Visit: Payer: Medicare Other | Admitting: Neurology

## 2018-04-03 DIAGNOSIS — G20 Parkinson's disease: Secondary | ICD-10-CM | POA: Diagnosis not present

## 2018-04-03 NOTE — Progress Notes (Signed)
Subjective:    Patient ID: Jack Cobb is a 67 y.o. male.  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 He is unaccompanied today. I last saw him on 10/01/2017, at which time he reported doing okay from a Parkinson's standpoint, however, he started having more problems with shooting pains to the right leg and had an L spine MRI in December 2018. He started seeing a spine specialist. He had received injections which provided temporary relief.    Today, 04/03/2018: He reports doing well, had lumbar spine surgery under Dr. Trenton Gammon in July 2019 and has done well with it. He is not on any pain medicine. He is active, weight is stable. He is taking Sinemet 1 pill 5 or 6 times a day, typically 5 times a day consistently. He is taking Mirapex 1 mg immediate release twice a day consistently, sometimes a half pill in midday. He takes Azilect 1 mg generic once daily. He has had no recent falls thankfully. He is wondering about the usefulness of CBD oil. He tried hemp for back pain which was helpful.   The patient's allergies, current medications, family history, past medical history, past social history, past surgical history and problem list were reviewed and updated as appropriate.    Previously (copied from previous notes for reference):      I saw him on 04/02/2017, at which time he had issues with constipation, no cognitive issues, no recent falls, he did report that he may have pulled a muscle in the right hip area. We mutually agreed to continue his current medication regimen of Azilect once daily, Mirapex ER once daily and Sinemet 1 pill 5 times a day.   He called in the interim reporting that the Mirapex ER was to expensive and we  switched him over to Mirapex IR 1 mg 3 times a day.   I saw him on 09/27/2016, at which time he reported doing okay but felt like his levodopa was wearing off after 3 hours. He was taking half a pill in the morning, 1 pill midday, then another full pill about 3-4 hours later and half a pill in the evening. Is not exercising on a regular basis but overall was fairly active. He had some constipation intermittently. Memory was stable, some forgetfulness reported, some mood irritability was reported by his girlfriend. He was getting his Mirapex ER brand-name from [place]. He was taking Azilect generic once daily. I suggested we continue with Mirapex ER 3 mg daily and Azilect 1 mg daily. I suggested we increase his Sinemet to 1 pill 5 times a day. I asked him to exercise more regularly and be proactive about constipation issues.    Of note, the patient no showed for appointment on 07/12/2016 secondary to illness. I saw him on 01/10/2016, at which time he reported worsening of his posture in more stiffness. He had no significant problems with balance or constipation or mood. He was taking overall a low dose of Sinemet. I suggested he could continue with the current regimen but increase his second dose also to a whole pill. He was taking half a pill for 3 doses and 1 whole pill at 3 PM. I suggested he could try to increase his Sinemet. Overall, he felt fairly stable but  I did notice some changes on exam as well.   I saw him on 07/12/2015, at which time he reported feeling stable. He did not notice much in the way of difference after the increase in the Mirapex long-acting. Overall, he was less stressed. He was able to transition his mother took class nursing home and she did quite well given her age of 61. He was sleeping a little better not having to worry about his mother on the time. He was taking Sinemet half a pill 4 times a day sometimes a whole pill at 3 PM, sometimes would get only 3 doses and for the day. He  is trying to stay active and trying to do better with his water intake as opposed to coffee and soda. I suggested he could consider alternating 1 pill with half a pill for a total of 3 pills daily, but he preferred to stay on the regimen of about 2 and half pills daily.    I saw him on 01/06/2015, at which time he reported doing fairly well overall. He was active. He had some increased stiffness particularly in the mornings and later in the evenings. He would sometimes forget his evening dose of Sinemet. He still had some reservations about taking Sinemet because of fear of potential long-term effects and side effects. We mutually agreed to keep his Sinemet at 3 half pills a day for 3 doses, and one whole pill around 3 PM. He was advised to continue with Azilect once daily as well. I suggested he increase his Mirapex ER from 2.25 mg to 3 mg once daily. He was on brand-name Mirapex ER.   I saw him on 07/08/2014, at which time he reported more tremors, especially towards the end of the day. He reported more fatigue, and stiffness and slowness after 3 PM. He was taking Azilect once daily and brand-name Mirapex long-acting. He was taking Sinemet half a pill 3 times a day. He continued to be very active and take care of his elderly mother, 70 years old, alternating her care with his brother. I suggested he continue with Azilect once daily and Mirapex ER but he increase Sinemet to half a pill at 7 AM, half a pill at 11 AM, 1 whole pill at 3 PM and half a pill at 7 PM.    I saw him on 01/05/2014, at which time he reported feeling worse with his posture and his walking was slower. He felt stable with his mood and memory. He had mild constipation. He continued to work here and there. He reported no issues with driving. His mother continue to stay with him every other week and he was sharing care of her with his brother. His mother has dementia and is in her mid 33s. I suggested he continue with Azilect and Mirapex ER  at the same doses but and a half pill of levodopa to his regimen.   I saw him on 06/16/2013, at which time he reported feeling stable, with the exception of occasional start hesitation and trouble turning. I encouraged him to increase his exercise to more daily walking exercises and kept his medications the same.   I first met him on 12/12/2012, at which time I felt that his exam was stable. I did not make any medication changes at the time but suggested he consider taking coenzyme Q 10.     He previously followed by Dr. Morene Antu and was last seen by him on 06/14/2012, and which time  Dr. Erling Cruz felt that he was doing very well. He did not make any changes to his medication regimen.   He was diagnosed with idiopathic right-sided predominant Parkinson's disease in May 2007 and initially treated with long-acting Mirapex, rasagiline and levodopa. He exercises regularly. He has had some daytime somnolence. He snores at night. In February 2013 he went on disability. He denied compulsive behavior, orthostatic hypotension or edema. He has had some difficulty with his memory. His mother has dementia at age 65. He lives alone, and takes care of his mother, who stays with him one week and with his only brother one week. He stays active and tends to sleep better when his mother stays with his brother as he can rest more and relax more. He has had no depression, anxiety, hallucinations, delusions or major memory problems, or dyskinesias.     His Past Medical History Is Significant For: Past Medical History:  Diagnosis Date  . Hypertension   . Parkinson disease ([place])     His Past Surgical History Is Significant For: Past Surgical History:  Procedure Laterality Date  . HERNIA REPAIR      His Family History Is Significant For: Family History  Problem Relation Age of Onset  . Heart attack Father     His Social History Is Significant For: Social History   Socioeconomic History  . Marital status: Single     Spouse name: Not on file  . Number of children: 3  . Years of education: 12th  . Highest education level: Not on file  Occupational History  . Occupation: N/A    Comment: retired  Scientific laboratory technician  . Financial resource strain: Not on file  . Food insecurity:    Worry: Not on file    Inability: Not on file  . Transportation needs:    Medical: Not on file    Non-medical: Not on file  Tobacco Use  . Smoking status: Never Smoker  . Smokeless tobacco: Former Systems developer    Types: Chew  . Tobacco comment: quit 8 years ago  Substance and Sexual Activity  . Alcohol use: No  . Drug use: No  . Sexual activity: Not on file  Lifestyle  . Physical activity:    Days per week: Not on file    Minutes per session: Not on file  . Stress: Not on file  Relationships  . Social connections:    Talks on phone: Not on file    Gets together: Not on file    Attends religious service: Not on file    Active member of club or organization: Not on file    Attends meetings of clubs or organizations: Not on file    Relationship status: Not on file  Other Topics Concern  . Not on file  Social History Narrative   Patient is right handed, resides alone, mother resides with him 1/2 the time,has dementia    His Allergies Are:  No Known Allergies:   His Current Medications Are:  Outpatient Encounter Medications as of 04/03/2018  Medication Sig  . carbidopa-levodopa (SINEMET IR) 25-100 MG tablet Take 1 tablet by mouth 6 (six) times daily.  . pramipexole (MIRAPEX) 1 MG tablet Take 1 tablet (1 mg total) by mouth 3 (three) times daily.  . rasagiline (AZILECT) 1 MG TABS tablet Take 1 tablet (1 mg total) by mouth daily.   No facility-administered encounter medications on file as of 04/03/2018.   :  Review of Systems:  Out of a complete  14 point review of systems, all are reviewed and negative with the exception of these symptoms as listed below:  Review of Systems  Neurological:       Pt presents today for  follow up on his PD. Pt denies any recent falls. Pt reports that he is doing well.    Objective:  Neurological Exam  Physical Exam Physical Examination:   Vitals:   04/03/18 0832  BP: 136/76  Pulse: (!) 55   General Examination: The patient is a very pleasant 67 y.o. male in no acute distress. He appears well-developed and well-nourished and well groomed.   HEENT:Normocephalic, atraumatic, pupils are equal, round and reactive to light and accommodation. Extraocular tracking shows mild saccadic breakdown without nystagmus noted. There is limitation to upper gaze. There is mild decrease in eye blink rate. Hearing is intact. Face is symmetric with mild facial masking and normal facial sensation. There is no lip, neck or jaw tremor. Neck is mild to moderately rigid, slight left head tilt, stable.Oropharynx exam reveals mild mouth dryness. No significant airway crowding is noted. Mallampati is class II. Tongue protrudes centrally and palate elevates symmetrically. There is no drooling.   Chest:is clear to auscultation without wheezing, rhonchi or crackles noted.  Heart:sounds are regular and normal without murmurs, rubs or gallops noted.   Abdomen:is soft, non-tender and non-distended with normal bowel sounds appreciated on auscultation.  Extremities:There is no pitting edema in the distal lower extremities bilaterally. Pedal pulses are intact.   Skin: is warm and dry with no trophic changes noted.   Musculoskeletal: exam reveals no obvious joint deformities, tenderness, joint swelling or erythema.  Neurologically:  Mental status: The patient is awake and alert, paying good attention. He is able to completely provide the history. He is oriented to: person, place, time/date, situation, day of week, month of year and year. His memory, attention, language and knowledge are intact. There is no aphasia, agnosia, apraxia or anomia. There is no bradyphrenia. Speech is mildly  hypophonic with no dysarthria noted. Mood is congruent and affect is normal.   Cranial nerves are as described above under HEENT exam. In addition, shoulder shrug is normal but L shoulder is slightly lower today.   Motor exam: Normal bulk, and strength for age is noted. There are no dyskinesias noted.  Tone is mildly rigid with presence of cogwheeling in the right upper extremity. There is overall mildto moderatebradykinesia. There is no tremor today.  Romberg is negative.   Reflexes are 1+ in the upper extremities and 1+ in the lower extremities.  Fine motor skills exam:Fine motor skills are mild to moderately impaired on the right and slightly better on the left, fairly stable.  Cerebellar testing shows no dysmetria or intention tremor on finger to nose testing. Heel to shin is unremarkable bilaterally. There is no truncal or gait ataxia.   Sensory exam is intact to light touch in the UEs and LEs.  Gait, station and balance: He stands up from the seated position with minimal difficulty and does not need to push up with His hands. He needs no assistance. No veering to one side is noted. He is not noted to lean to the side. Posture is moderately stooped for age,stable. Stance is narrow based. He walks with fairly good pace, decreased stride length and decreased arm swing on the right, balance is fairly well-preserved. He turns with slight insecurityin about 3 steps.  Assessment and Plan:   In summary, Jack Cobb is a very pleasant  67 year old male with an underlying medical history of hypertension and chest pain, status post hernia repair in 2012, who presents for followup consultation of his right-sided predominant Parkinson's disease, akinetic-rigid type, with diagnosisdatingback to 2007, symptoms perhaps dating back to 2006. With time, he has had very mild progression, has remained stable for years in the past.We have with timeincreased his Mirapex and also his  Sinemet.His physical exam has remained fairly stable for quite some time in the past, but he has had some progression in his posture and stiffnesswith time. Hehas had issues with lower back and sciatica type symptoms, he is now s/p L spine surgery in 11/2017. He has done rather well. From them Parkinson's standpoint she is doing well, he does note that sometimes his speech gets very weak and slurred by the end of the afternoon and is wondering what can be done about it. He was advised that we could seek consultation and treatment through speech therapy. He would like to hold off. We talked about the importance of being proactive about constipation issues and staying active mentally and physically, well hydrated as well. He is advised to continue with his current medication regimen which includes Sinemet 1 pill 6 times a day, he can take it 5 times a day as well. He will take Azilect once daily in the morning 1 mg strength and Mirapex 1 mg IR 3 times a day, he can also take it twice a day or 2-1/2 pills per day if he wishes. I advised him that currently there is no recommendation regarding the CBD oil and Parkinson's disease. I suggested a six-month follow-up routinely, sooner if needed. I answered all his questions today and he was in agreement. I spent 25 minutes in total face-to-face time with the patient, more than 50% of which was spent in counseling and coordination of care, reviewing test results, reviewing medication and discussing or reviewing the diagnosis of PD, its prognosis and treatment options. Pertinent laboratory and imaging test results that were available during this visit with the patient were reviewed by me and considered in my medical decision making (see chart for details).

## 2018-04-03 NOTE — Patient Instructions (Addendum)
We will keep your meds the same. We may add Sinemet CR at bedtime, which is a long acting formulation of levodopa (for future reference).  We may request a referral to speech therapy - think about it.  Stay active mentally and physically. Drink plenty of water. Be proactive about constipation.  Please check with an eye doctor for a routine eye exam.

## 2018-08-28 ENCOUNTER — Other Ambulatory Visit: Payer: Self-pay

## 2018-08-28 MED ORDER — PRAMIPEXOLE DIHYDROCHLORIDE 1 MG PO TABS: 1.0000 mg | ORAL_TABLET | Freq: Three times a day (TID) | ORAL | 3 refills | Status: DC

## 2018-09-24 ENCOUNTER — Telehealth: Payer: Self-pay | Admitting: Neurology

## 2018-09-24 NOTE — Telephone Encounter (Signed)
Due to current COVID 19 pandemic, our office is severely reducing in office visits until further notice, in order to minimize the risk to our patients and healthcare providers.   Called patient to offer a virtual visit for his 5/13 appt. Patient declined due to not have resources for a virtual visit. Patient accepted a telephone visit and understands that he will need to be available between 8-9 AM, and he will receive calls from RN, front office staff, and doctor.  Pt understands that although there may be some limitations with this type of visit, we will take all precautions to reduce any security or privacy concerns.  Pt understands that this will be treated like an in office visit and we will file with pt's insurance, and there may be a patient responsible charge related to this service.

## 2018-10-01 NOTE — Telephone Encounter (Signed)
I called pt, offered him an in office visit. Pt declined to come to the office and would prefer a telephone call. He can be called at his appt time using his home number. Pt's meds, allergies, and PMH were updated.

## 2018-10-02 ENCOUNTER — Other Ambulatory Visit: Payer: Self-pay

## 2018-10-02 ENCOUNTER — Encounter: Payer: Self-pay | Admitting: Neurology

## 2018-10-02 ENCOUNTER — Ambulatory Visit (INDEPENDENT_AMBULATORY_CARE_PROVIDER_SITE_OTHER): Payer: Medicare Other | Admitting: Neurology

## 2018-10-02 ENCOUNTER — Telehealth: Payer: Self-pay

## 2018-10-02 DIAGNOSIS — G20 Parkinson's disease: Secondary | ICD-10-CM | POA: Diagnosis not present

## 2018-10-02 MED ORDER — RASAGILINE MESYLATE 1 MG PO TABS: 1.0000 mg | ORAL_TABLET | Freq: Every day | ORAL | 3 refills | Status: DC

## 2018-10-02 MED ORDER — CARBIDOPA-LEVODOPA 25-100 MG PO TABS: 1.0000 | ORAL_TABLET | Freq: Every day | ORAL | 3 refills | Status: DC

## 2018-10-02 NOTE — Progress Notes (Signed)
Interim history:  Mr. 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  He is unaccompanied today and joins via home ph from his home.  I am located in my office.  I last saw him on 04/03/2018, at which time he reported doing well, he had had lumbar spine surgery under Dr. Trenton Gammon in July 2019 and had done well after it.  He was not on any pain medication.  He was trying to stay active.  Sinemet was 1 pill 5 or 6 times a day, more consistently 5 times a day.  He was taking Mirapex twice daily 1 mg strength and sometimes an additional half pill during the middle of the day.  He had tried hemp for back pain which he found helpful, he was wondering about the usefulness of CBD oil.  I suggested we keep his medication regimen the same, including Azilect 1 mg once daily in the morning, Mirapex and Sinemet.  We talked about potentially doing speech therapy evaluation.  Today, 10/01/2017: Please also see below for virtual visit documentation.    The patient's allergies, current medications, family history, past medical history, past social history, past surgical history and problem list were reviewed and updated as appropriate.    Previously (copied from previous notes for reference):    I saw him on 10/01/2017, at which time he reported doing okay from a Parkinsons standpoint, however, he started having more problems with shooting pains to the right leg and had an L spine MRI in December 2018. He started seeing a spine specialist. He had received injections which provided temporary relief.     I saw him on 04/02/2017, at which time he had issues with constipation, no cognitive issues, no recent falls, he did report that he may have pulled a muscle  in the right hip area. We mutually agreed to continue his current medication regimen of Azilect once daily, Mirapex ER once daily and Sinemet 1 pill 5 times a day.   He called in the interim reporting that the Mirapex ER was to expensive and we switched him over to Mirapex IR 1 mg 3 times a day.   I saw him on 09/27/2016, at which time he reported doing okay but felt like his levodopa was wearing off after 3 hours. He was taking half a pill in the morning, 1 pill midday, then another full pill about 3-4 hours later and half a pill in the evening. Is not exercising on a regular basis but overall was fairly active. He had some constipation intermittently. Memory was stable, some forgetfulness reported, some mood irritability was reported by his girlfriend. He was getting his Mirapex ER brand-name from [place]. He was taking Azilect generic once daily. I suggested we continue with Mirapex ER 3 mg daily and Azilect 1 mg daily. I suggested we increase his Sinemet to 1 pill 5 times a day. I asked him to exercise more regularly and be proactive about constipation issues.    Of note, the patient no showed for appointment on 07/12/2016 secondary to illness. I saw him on 01/10/2016, at which time he reported worsening of his posture in more stiffness. He had no significant problems with balance or constipation or mood. He was taking overall a low dose of Sinemet. I suggested  he could continue with the current regimen but increase his second dose also to a whole pill. He was taking half a pill for 3 doses and 1 whole pill at 3 PM. I suggested he could try to increase his Sinemet. Overall, he felt fairly stable but I did notice some changes on exam as well.   I saw him on 07/12/2015, at which time he reported feeling stable. He did not notice much in the way of difference after the increase in the Mirapex long-acting. Overall, he was less stressed. He was able to transition his mother took class nursing home and she did  quite well given her age of 22. He was sleeping a little better not having to worry about his mother on the time. He was taking Sinemet half a pill 4 times a day sometimes a whole pill at 3 PM, sometimes would get only 3 doses and for the day. He is trying to stay active and trying to do better with his water intake as opposed to coffee and soda. I suggested he could consider alternating 1 pill with half a pill for a total of 3 pills daily, but he preferred to stay on the regimen of about 2 and half pills daily.    I saw him on 01/06/2015, at which time he reported doing fairly well overall. He was active. He had some increased stiffness particularly in the mornings and later in the evenings. He would sometimes forget his evening dose of Sinemet. He still had some reservations about taking Sinemet because of fear of potential long-term effects and side effects. We mutually agreed to keep his Sinemet at 3 half pills a day for 3 doses, and one whole pill around 3 PM. He was advised to continue with Azilect once daily as well. I suggested he increase his Mirapex ER from 2.25 mg to 3 mg once daily. He was on brand-name Mirapex ER.   I saw him on 07/08/2014, at which time he reported more tremors, especially towards the end of the day. He reported more fatigue, and stiffness and slowness after 3 PM. He was taking Azilect once daily and brand-name Mirapex long-acting. He was taking Sinemet half a pill 3 times a day. He continued to be very active and take care of his elderly mother, 98 years old, alternating her care with his brother. I suggested he continue with Azilect once daily and Mirapex ER but he increase Sinemet to half a pill at 7 AM, half a pill at 11 AM, 1 whole pill at 3 PM and half a pill at 7 PM.    I saw him on 01/05/2014, at which time he reported feeling worse with his posture and his walking was slower. He felt stable with his mood and memory. He had mild constipation. He continued to work here and  there. He reported no issues with driving. His mother continue to stay with him every other week and he was sharing care of her with his brother. His mother has dementia and is in her mid 28s. I suggested he continue with Azilect and Mirapex ER at the same doses but and a half pill of levodopa to his regimen.   I saw him on 06/16/2013, at which time he reported feeling stable, with the exception of occasional start hesitation and trouble turning. I encouraged him to increase his exercise to more daily walking exercises and kept his medications the same.   I first met him on 12/12/2012, at which  time I felt that his exam was stable. I did not make any medication changes at the time but suggested he consider taking coenzyme Q 10.     He previously followed by Dr. Morene Antu and was last seen by him on 06/14/2012, and which time Dr. Erling Cruz felt that he was doing very well. He did not make any changes to his medication regimen.   He was diagnosed with idiopathic right-sided predominant Parkinson's disease in May 2007 and initially treated with long-acting Mirapex, rasagiline and levodopa. He exercises regularly. He has had some daytime somnolence. He snores at night. In February 2013 he went on disability. He denied compulsive behavior, orthostatic hypotension or edema. He has had some difficulty with his memory. His mother has dementia at age 14. He lives alone, and takes care of his mother, who stays with him one week and with his only brother one week. He stays active and tends to sleep better when his mother stays with his brother as he can rest more and relax more. He has had no depression, anxiety, hallucinations, delusions or major memory problems, or dyskinesias.  His Past Medical History Is Significant For: Past Medical History:  Diagnosis Date   Hypertension    Parkinson disease ([place])     His Past Surgical History Is Significant For: Past Surgical History:  Procedure Laterality Date    HERNIA REPAIR      His Family History Is Significant For: Family History  Problem Relation Age of Onset   Heart attack Father     His Social History Is Significant For: Social History   Socioeconomic History   Marital status: Single    Spouse name: Not on file   Number of children: 3   Years of education: 12th   Highest education level: Not on file  Occupational History   Occupation: N/A    Comment: retired  Scientist, product/process development strain: Not on Training and development officer insecurity:    Worry: Not on file    Inability: Not on Lexicographer needs:    Medical: Not on file    Non-medical: Not on file  Tobacco Use   Smoking status: Never Smoker   Smokeless tobacco: Former Systems developer    Types: Chew   Tobacco comment: quit 8 years ago  Substance and Sexual Activity   Alcohol use: No   Drug use: No   Sexual activity: Not on file  Lifestyle   Physical activity:    Days per week: Not on file    Minutes per session: Not on file   Stress: Not on file  Relationships   Social connections:    Talks on phone: Not on file    Gets together: Not on file    Attends religious service: Not on file    Active member of club or organization: Not on file    Attends meetings of clubs or organizations: Not on file    Relationship status: Not on file  Other Topics Concern   Not on file  Social History Narrative   Patient is right handed, resides alone, mother resides with him 1/2 the time,has dementia    His Allergies Are:  No Known Allergies:   His Current Medications Are:  Outpatient Encounter Medications as of 10/02/2018  Medication Sig   carbidopa-levodopa (SINEMET IR) 25-100 MG tablet Take 1 tablet by mouth 6 (six) times daily.   pramipexole (MIRAPEX) 1 MG tablet Take 1 tablet (1 mg total) by  mouth 3 (three) times daily.   rasagiline (AZILECT) 1 MG TABS tablet Take 1 tablet (1 mg total) by mouth daily.   No facility-administered encounter medications on  file as of 10/02/2018.   :  Review of Systems:  Out of a complete 14 point review of systems, all are reviewed and negative with the exception of these symptoms as listed below:  Virtual Visit via Telephone Note on 10/02/18:   I connected with Mr. Albarran on 10/02/18 at  8:30 AM EDT by telephone and verified that I am speaking with the correct person using two identifiers.   I discussed the limitations, risks, security and privacy concerns of performing an evaluation and management service by telephone and the availability of in person appointments. I also discussed with the patient that there may be a patient responsible charge related to this service. The patient expressed understanding and agreed to proceed.  History of Present Illness: He reports Feeling stable for the most part, he has noticed some balance issues at times especially with turns, by the end of the day he feels more stiff and slow.  He has not fallen thankfully.  His posture may have become worse and he has had a little more difficulty with his speech at times.  He has had no acute illness thankfully recently.  He is intermittent constipation is currently under good control, He does take MiraLAX as needed.  He tries to hydrate well and stay active.   Observations/Objective:  There are no recent vital signs available for my review signs in his chart are from our visit in November 2019. He appears to be in no acute distress, he is well oriented, good comprehension, slight slowness in speech noted.  He has moderate hypophonia and slight dysarthria but is not difficult to understand.   Assessment and Plan: In summary, ANANIAS KOLANDER is a very pleasant 68 year old male with an underlying medical history of hypertension and chest pain, status post hernia repair in 2012, who presents for a phone based followup consultation of his right-sided predominant Parkinson's disease, akinetic-rigid type, with diagnosisdatingback to 2007,  symptoms perhaps dating back to 2006. With time, he has mild progression, has remained stable for years in the past.We have with timeincreased his Mirapex and also his Sinemet.His physical exam has remained fairly stable for quite some time in the past, but he has had some progression in his posture and stiffnesswith time. Hehas had issues with lower back and sciatica type symptoms, he is now s/p L spine surgery in 11/2017. He has done rather well after that. He reports worsening slowness and posture. From the Parkinson's standpoint, He is advised to continue with Sinemet 1 pill 6 times a day, try to be consistent with a regimen because he is noted some interim Worsening of some of his motor symptoms.  In addition, he is also advised to take his Mirapex 1 mg strength 3 times daily consistently, he has a tendency to forget the midday dose he admits. For his speech, We decided to proceed with a referral to neuro rehab for speech therapy evaluation.  We can certainly wait until it is deemed safe to come in for a visit, would like to get going on the referral.  We mutually agreed to reconvene in 6 months, sooner if needed.  I answered all his questions today and he was in agreement. Rx renewed for C/L and Azilect.   Follow Up Instructions: 1. Continue Sinemet 1 pill 6 times a day  2. Continue Mirapex 1 pill 3 times daily, patient advised to take it consistently to help with motor symptoms 3. Continue Azilect 1 mg once daily in the morning  4. Follow-up in clinic for face-to-face visit in 6 months. 5. Call or email through My Chart for any interim questions or concerns.     I discussed the assessment and treatment plan with the patient. The patient was provided an opportunity to ask questions and all were answered. The patient agreed with the plan and demonstrated an understanding of the instructions.   The patient was advised to call back or seek an in-person evaluation if the symptoms worsen or if the  condition fails to improve as anticipated.  I provided 11 minutes of non-face-to-face time during this encounter.   Star Age, MD

## 2018-10-02 NOTE — Patient Instructions (Signed)
Given over the phone during today's phone call virtual visit.  

## 2018-10-02 NOTE — Telephone Encounter (Signed)
I called pt, scheduled his 6 mo follow up. Pt verbalized understanding of new appt date and time.

## 2019-04-09 ENCOUNTER — Ambulatory Visit: Payer: Medicare Other | Admitting: Neurology

## 2019-04-09 ENCOUNTER — Encounter: Payer: Self-pay | Admitting: Neurology

## 2019-04-09 ENCOUNTER — Other Ambulatory Visit: Payer: Self-pay

## 2019-04-09 VITALS — BP 130/75 | HR 62 | Ht 70.5 in | Wt 160.0 lb

## 2019-04-09 DIAGNOSIS — G20 Parkinson's disease: Secondary | ICD-10-CM | POA: Diagnosis not present

## 2019-04-09 MED ORDER — CARBIDOPA-LEVODOPA ER 50-200 MG PO TBCR: 1.0000 | EXTENDED_RELEASE_TABLET | Freq: Every day | ORAL | 3 refills | Status: DC

## 2019-04-09 NOTE — Progress Notes (Signed)
Subjective:    Patient ID: Jack Cobb is a 68 y.o. male.  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  He is unaccompanied today.  I last saw him in a virtual visit on 10/02/2018, at which time he reported feeling fairly stable.  He had some intermittent issues with constipation and his balance and mobility.  He was advised to continue with Sinemet 1 pill 6 times a day and Mirapex 1 pill 3 times a day as well as Azilect 1 pill once daily.  Today, 04/09/2019:    He reports feeling stable for the most part, takes Sinemet 6 times a day about 2-1/2 hourly apart, when he goes longer than that, even 3 hours, he feels off.  He has more difficulty getting started in the morning and sometimes start hesitation during the day, speech gets weaker as the day progresses, no recent falls, no significant constipation, tries to hydrate well, is physically active, also no significant mood or memory issues.  He got married to his long-term girlfriend in October 2020 at the court house.  He has 3 grown children, she has 1.  Sometimes especially when going down stairs or downhill, he feels insecure.  The patient's allergies, current medications, family history, past medical history, past social history, past surgical history and problem list were reviewed and updated as appropriate.    Previously (copied from previous notes for reference):    I saw him on 04/03/2018, at which time he reported doing well, he had had lumbar spine surgery under Dr. Trenton Gammon in July 2019 and had done well after it.  He was not on any pain medication.  He was trying to stay active.  Sinemet was 1 pill 5 or 6 times a day, more consistently 5 times a day.  He was taking Mirapex  twice daily 1 mg strength and sometimes an additional half pill during the middle of the day.  He had tried hemp for back pain which he found helpful, he was wondering about the usefulness of CBD oil.  I suggested we keep his medication regimen the same, including Azilect 1 mg once daily in the morning, Mirapex and Sinemet.  We talked about potentially doing speech therapy evaluation.       I saw him on 10/01/2017, at which time he reported doing okay from a Parkinson's standpoint, however, he started having more problems with shooting pains to the right leg and had an L spine MRI in December 2018. He started seeing a spine specialist. He had received injections which provided temporary relief.     I saw him on 04/02/2017, at which time he had issues with constipation, no cognitive issues, no recent falls, he did report that he may have pulled a muscle in the right hip area. We mutually agreed to continue his current medication regimen of Azilect once daily, Mirapex ER once daily and Sinemet 1 pill 5 times a day.   He called in the interim reporting that the Mirapex ER was to expensive and we switched him over to Mirapex IR 1 mg 3 times a day.   I saw him on 09/27/2016, at which time he reported doing okay but felt like his levodopa was wearing off after 3 hours. He was taking  half a pill in the morning, 1 pill midday, then another full pill about 3-4 hours later and half a pill in the evening. Is not exercising on a regular basis but overall was fairly active. He had some constipation intermittently. Memory was stable, some forgetfulness reported, some mood irritability was reported by his girlfriend. He was getting his Mirapex ER brand-name from [place]. He was taking Azilect generic once daily. I suggested we continue with Mirapex ER 3 mg daily and Azilect 1 mg daily. I suggested we increase his Sinemet to 1 pill 5 times a day. I asked him to exercise more regularly and be proactive about constipation  issues.    Of note, the patient no showed for appointment on 07/12/2016 secondary to illness. I saw him on 01/10/2016, at which time he reported worsening of his posture in more stiffness. He had no significant problems with balance or constipation or mood. He was taking overall a low dose of Sinemet. I suggested he could continue with the current regimen but increase his second dose also to a whole pill. He was taking half a pill for 3 doses and 1 whole pill at 3 PM. I suggested he could try to increase his Sinemet. Overall, he felt fairly stable but I did notice some changes on exam as well.   I saw him on 07/12/2015, at which time he reported feeling stable. He did not notice much in the way of difference after the increase in the Mirapex long-acting. Overall, he was less stressed. He was able to transition his mother took class nursing home and she did quite well given her age of 59. He was sleeping a little better not having to worry about his mother on the time. He was taking Sinemet half a pill 4 times a day sometimes a whole pill at 3 PM, sometimes would get only 3 doses and for the day. He is trying to stay active and trying to do better with his water intake as opposed to coffee and soda. I suggested he could consider alternating 1 pill with half a pill for a total of 3 pills daily, but he preferred to stay on the regimen of about 2 and half pills daily.    I saw him on 01/06/2015, at which time he reported doing fairly well overall. He was active. He had some increased stiffness particularly in the mornings and later in the evenings. He would sometimes forget his evening dose of Sinemet. He still had some reservations about taking Sinemet because of fear of potential long-term effects and side effects. We mutually agreed to keep his Sinemet at 3 half pills a day for 3 doses, and one whole pill around 3 PM. He was advised to continue with Azilect once daily as well. I suggested he increase his  Mirapex ER from 2.25 mg to 3 mg once daily. He was on brand-name Mirapex ER.   I saw him on 07/08/2014, at which time he reported more tremors, especially towards the end of the day. He reported more fatigue, and stiffness and slowness after 3 PM. He was taking Azilect once daily and brand-name Mirapex long-acting. He was taking Sinemet half a pill 3 times a day. He continued to be very active and take care of his elderly mother, 39 years old, alternating her care with his brother. I suggested he continue with Azilect once daily and Mirapex ER but he increase Sinemet to half a pill at 7 AM, half a pill at 11  AM, 1 whole pill at 3 PM and half a pill at 7 PM.    I saw him on 01/05/2014, at which time he reported feeling worse with his posture and his walking was slower. He felt stable with his mood and memory. He had mild constipation. He continued to work here and there. He reported no issues with driving. His mother continue to stay with him every other week and he was sharing care of her with his brother. His mother has dementia and is in her mid 52s. I suggested he continue with Azilect and Mirapex ER at the same doses but and a half pill of levodopa to his regimen.   I saw him on 06/16/2013, at which time he reported feeling stable, with the exception of occasional start hesitation and trouble turning. I encouraged him to increase his exercise to more daily walking exercises and kept his medications the same.   I first met him on 12/12/2012, at which time I felt that his exam was stable. I did not make any medication changes at the time but suggested he consider taking coenzyme Q 10.     He previously followed by Dr. Morene Antu and was last seen by him on 06/14/2012, and which time Dr. Erling Cruz felt that he was doing very well. He did not make any changes to his medication regimen.   He was diagnosed with idiopathic right-sided predominant Parkinson's disease in May 2007 and initially treated with  long-acting Mirapex, rasagiline and levodopa. He exercises regularly. He has had some daytime somnolence. He snores at night. In February 2013 he went on disability. He denied compulsive behavior, orthostatic hypotension or edema. He has had some difficulty with his memory. His mother has dementia at age 36. He lives alone, and takes care of his mother, who stays with him one week and with his only brother one week. He stays active and tends to sleep better when his mother stays with his brother as he can rest more and relax more. He has had no depression, anxiety, hallucinations, delusions or major memory problems, or dyskinesias.  His Past Medical History Is Significant For: Past Medical History:  Diagnosis Date  . Hypertension   . Parkinson disease ([place])     His Past Surgical History Is Significant For: Past Surgical History:  Procedure Laterality Date  . HERNIA REPAIR      His Family History Is Significant For: Family History  Problem Relation Age of Onset  . Heart attack Father     His Social History Is Significant For: Social History   Socioeconomic History  . Marital status: Single    Spouse name: Not on file  . Number of children: 3  . Years of education: 12th  . Highest education level: Not on file  Occupational History  . Occupation: N/A    Comment: retired  Scientific laboratory technician  . Financial resource strain: Not on file  . Food insecurity    Worry: Not on file    Inability: Not on file  . Transportation needs    Medical: Not on file    Non-medical: Not on file  Tobacco Use  . Smoking status: Never Smoker  . Smokeless tobacco: Former Systems developer    Types: Chew  . Tobacco comment: quit 8 years ago  Substance and Sexual Activity  . Alcohol use: No  . Drug use: No  . Sexual activity: Not on file  Lifestyle  . Physical activity    Days per week: Not  on file    Minutes per session: Not on file  . Stress: Not on file  Relationships  . Social Herbalist on phone:  Not on file    Gets together: Not on file    Attends religious service: Not on file    Active member of club or organization: Not on file    Attends meetings of clubs or organizations: Not on file    Relationship status: Not on file  Other Topics Concern  . Not on file  Social History Narrative   Patient is right handed, resides alone, mother resides with him 1/2 the time,has dementia    His Allergies Are:  No Known Allergies:   His Current Medications Are:  Outpatient Encounter Medications as of 04/09/2019  Medication Sig  . carbidopa-levodopa (SINEMET IR) 25-100 MG tablet Take 1 tablet by mouth 6 (six) times daily.  . pramipexole (MIRAPEX) 1 MG tablet Take 1 tablet (1 mg total) by mouth 3 (three) times daily.  . rasagiline (AZILECT) 1 MG TABS tablet Take 1 tablet (1 mg total) by mouth daily.   No facility-administered encounter medications on file as of 04/09/2019.   :  Review of Systems:  Out of a complete 14 point review of systems, all are reviewed and negative with the exception of these symptoms as listed below: Review of Systems  Neurological:       Pt presents today to discuss his PD. Pt feels that he has been stable.    Objective:  Neurological Exam  Physical Exam Physical Examination:   Vitals:   04/09/19 0818  BP: 130/75  Pulse: 62    General Examination: The patient is a very pleasant 68 y.o. male in no acute distress. He appears well-developed and well-nourished and well groomed.   HEENT:Normocephalic, atraumatic, pupils are equal, round and reactive to light, extraocular tracking shows mild saccadic breakdown without nystagmus noted. There is limitation to upper gaze. There is mild decrease in eye blink rate. Hearing is intact. Face is symmetric with mild facial masking and normal facial sensation. There is no lip, neck or jaw tremor. Neck is mild to moderately rigid, slight left head tilt, stable.Oropharynx exam reveals mild mouth dryness. No  significant airway crowding is noted. Mallampati is class II. Tongue protrudes centrally and palate elevates symmetrically. There is no drooling.   Chest:is clear to auscultation without wheezing, rhonchi or crackles noted.  Heart:sounds are regular and normal without murmurs, rubs or gallops noted.   Abdomen:is soft, non-tender and non-distended with normal bowel sounds appreciated on auscultation.  Extremities:There is no pitting edema in the distal lower extremities bilaterally. Pedal pulses are intact.   Skin: is warm and dry with no trophic changes noted.   Musculoskeletal: exam reveals no obvious joint deformities, tenderness, joint swelling or erythema.  Neurologically:  Mental status: The patient is awake and alert, paying good attention. He is able to completely provide the history. He is oriented to: person, place, time/date, situation, day of week, month of year and year. His memory, attention, language and knowledge are intact. There is no aphasia, agnosia, apraxia or anomia. There is no bradyphrenia. Speech is mildly hypophonic with no dysarthria noted. Mood is congruent and affect is normal.   Cranial nerves are as described above under HEENT exam. In addition, shoulder shrug is normal but L shoulder is slightly lower today.   Motor exam: Normal bulk, and strength for age is noted. There are no dyskinesias noted.  Tone is  mildly rigid with presence of cogwheeling in the right upper extremity. There is overall mildto moderatebradykinesia. There is no tremor today.  Romberg is negative.   Reflexes are 1+ in the upper extremities and 1+ in the lower extremities.  Fine motor skills exam:Fine motor skills are mild to moderately impaired on the right and slightly better on the left, fairly stable.  Cerebellar testing shows no dysmetria or intention tremor on finger to nose testing. Heel to shin is unremarkable bilaterally. There is no truncal or gait ataxia.    Sensory exam is intact to light touch in the UEs and LEs.  Gait, station and balance: He stands up from the seated position with minimal difficulty and does not need to push up with His hands. He needs no assistance. No veering to one side is noted. He is not noted to lean to the side. Posture is moderately stooped for age,stable. Stance is narrow based. He walks with fairly good pace, decreased stride length and decreased arm swing on the right, balance is fairly well-preserved. He turns with slight insecurityin about 3 steps. All stable.  Assessment and Plan:   In summary, KENZEL RUESCH is a very pleasant 68 year old male with an underlying medical history of hypertension and chest pain, status post hernia repair in 2012, who presents for followup consultation of his right-sided predominant Parkinson's disease, akinetic-rigid type, with diagnosisdatingback to 2007, symptoms perhaps dating back to 2006. With time, he has had mild progression, and had remained stable for years in the past.We have with timeincreased his Mirapex and also his Sinemet.His physical exam has remained fairly stable for quite some time in the past, but he has had some progression in his posture and stiffnesswith time. Hehas had issues with lower back and sciatica type symptoms, now s/p L spine surgery in 11/2017. He has done rather well. From the Parkinson's standpoint he is doing well, he has had hypophonia, but declined ST referral in the past. We talked about the ongoing importance of being proactive about constipation issues and staying active mentally and physically, well hydrated as well. He has had more difficulty with starting movements, more so first thing in the morning.  He is advised to add Sinemet CR at bedtime.  We also talked about adding NourianzOnce daily which has been approved for off times.  He is encouraged to read up on the new medication but is Agreeable to trying long-acting Sinemet at  bedtime, somewhere between 930 and 1030.  His last dose of Sinemet IR is around 7 PM.  He is advised to continue his medication regimen otherwise, which include Sinemet IR 1 pill 6 times a day and Mirapex 1 mg 3 times daily. I suggested a six-month follow-up routinely, sooner if needed. I answered all his questions today and he was in agreement. I spent 25 minutes in total face-to-face time with the patient, more than 50% of which was spent in counseling and coordination of care, reviewing test results, reviewing medication and discussing or reviewing the diagnosis of PD, its prognosis and treatment options. Pertinent laboratory and imaging test results that were available during this visit with the patient were reviewed by me and considered in my medical decision making (see chart for details).

## 2019-04-09 NOTE — Patient Instructions (Signed)
For your difficulty getting started first thing in the morning, I would like to add Sinemet CR at bedtime, which is a long acting formulation of levodopa, 50/200 mg strength.   Please continue with Azilect once daily, Mirapex 1 mg 3 times a day and Sinemet 1 pill 6 times a day on your current schedule.  Your prescriptions are up-to-date.  We will consider for future use: Nourianz 20 mg once daily for off times.  You can take it once a day, morning or evening. Side effects may include involuntary movements called dyskinesia, constipation, Nausea, dizziness, decreased appetite, rare side effects may include rash, diarrhea and hallucinations.  You can read up on it online, If you wish to try it, I will send in a prescription for you to your pharmacy.  Let us plan to follow-up in 6 months.

## 2019-09-11 ENCOUNTER — Other Ambulatory Visit: Payer: Self-pay

## 2019-09-11 MED ORDER — PRAMIPEXOLE DIHYDROCHLORIDE 1 MG PO TABS: 1.0000 mg | ORAL_TABLET | Freq: Three times a day (TID) | ORAL | 1 refills | Status: DC

## 2019-10-07 ENCOUNTER — Other Ambulatory Visit: Payer: Self-pay

## 2019-10-07 ENCOUNTER — Encounter: Payer: Self-pay | Admitting: Neurology

## 2019-10-07 ENCOUNTER — Ambulatory Visit: Payer: Medicare Other | Admitting: Neurology

## 2019-10-07 DIAGNOSIS — G20 Parkinson's disease: Secondary | ICD-10-CM

## 2019-10-07 MED ORDER — RASAGILINE MESYLATE 1 MG PO TABS: 1.0000 mg | ORAL_TABLET | Freq: Every day | ORAL | 3 refills | Status: DC

## 2019-10-07 MED ORDER — CARBIDOPA-LEVODOPA 25-100 MG PO TABS: 1.0000 | ORAL_TABLET | Freq: Every day | ORAL | 3 refills | Status: DC

## 2019-10-07 MED ORDER — PRAMIPEXOLE DIHYDROCHLORIDE 1 MG PO TABS: 1.0000 mg | ORAL_TABLET | Freq: Three times a day (TID) | ORAL | 1 refills | Status: DC

## 2019-10-07 NOTE — Progress Notes (Signed)
Subjective:    Patient ID: Jack Cobb is a 69 y.o. male.  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  He is unaccompanied today.  I last saw him On 04/09/2019, at which time he was taking Sinemet 6 times a day about 2-1/2 hourly, reported more difficulty getting started in the morning, more slowness.  He was advised to add Sinemet CR at bedtime.  Today, 10/07/2018:    He reports feeling stable for the most part, did not start the Sinemet CR, but filled it.  Has not taken the Covid vax. No falls, no significant constipation,   The patient's allergies, current medications, family history, past medical history, past social history, past surgical history and problem list were reviewed and updated as appropriate.    Previously (copied from previous notes for reference):      I saw him in a virtual visit on 10/02/2018, at which time he reported feeling fairly stable.  He had some intermittent issues with constipation and his balance and mobility.  He was advised to continue with Sinemet 1 pill 6 times a day and Mirapex 1 pill 3 times a day as well as Azilect 1 pill once daily.    I saw him on 04/03/2018, at which time he reported doing well, he had had lumbar spine surgery under Dr. Trenton Gammon in July 2019 and had done well after it.  He was not on any pain medication.  He was trying to stay active.  Sinemet was 1 pill 5 or 6 times a day, more consistently 5 times a day.  He was taking Mirapex twice daily 1 mg strength and sometimes an additional half pill during the middle of the day.  He had tried hemp for back pain which he found helpful, he was wondering about the usefulness of CBD oil.  I suggested we keep his medication regimen the  same, including Azilect 1 mg once daily in the morning, Mirapex and Sinemet.  We talked about potentially doing speech therapy evaluation.       I saw him on 10/01/2017, at which time he reported doing okay from a Parkinson's standpoint, however, he started having more problems with shooting pains to the right leg and had an L spine MRI in December 2018. He started seeing a spine specialist. He had received injections which provided temporary relief.     I saw him on 04/02/2017, at which time he had issues with constipation, no cognitive issues, no recent falls, he did report that he may have pulled a muscle in the right hip area. We mutually agreed to continue his current medication regimen of Azilect once daily, Mirapex ER once daily and Sinemet 1 pill 5 times a day.   He called in the interim reporting that the Mirapex ER was to expensive and we switched him over to Mirapex IR 1 mg 3 times a day.   I saw him on 09/27/2016, at which time he reported doing okay but felt like his levodopa was wearing off after 3 hours. He was taking half a pill in the morning, 1 pill midday, then another full pill about 3-4 hours later and half a pill in the evening. Is not exercising on a regular basis but overall was fairly  active. He had some constipation intermittently. Memory was stable, some forgetfulness reported, some mood irritability was reported by his girlfriend. He was getting his Mirapex ER brand-name from [place]. He was taking Azilect generic once daily. I suggested we continue with Mirapex ER 3 mg daily and Azilect 1 mg daily. I suggested we increase his Sinemet to 1 pill 5 times a day. I asked him to exercise more regularly and be proactive about constipation issues.    Of note, the patient no showed for appointment on 07/12/2016 secondary to illness. I saw him on 01/10/2016, at which time he reported worsening of his posture in more stiffness. He had no significant problems with balance or constipation  or mood. He was taking overall a low dose of Sinemet. I suggested he could continue with the current regimen but increase his second dose also to a whole pill. He was taking half a pill for 3 doses and 1 whole pill at 3 PM. I suggested he could try to increase his Sinemet. Overall, he felt fairly stable but I did notice some changes on exam as well.   I saw him on 07/12/2015, at which time he reported feeling stable. He did not notice much in the way of difference after the increase in the Mirapex long-acting. Overall, he was less stressed. He was able to transition his mother took class nursing home and she did quite well given her age of 23. He was sleeping a little better not having to worry about his mother on the time. He was taking Sinemet half a pill 4 times a day sometimes a whole pill at 3 PM, sometimes would get only 3 doses and for the day. He is trying to stay active and trying to do better with his water intake as opposed to coffee and soda. I suggested he could consider alternating 1 pill with half a pill for a total of 3 pills daily, but he preferred to stay on the regimen of about 2 and half pills daily.    I saw him on 01/06/2015, at which time he reported doing fairly well overall. He was active. He had some increased stiffness particularly in the mornings and later in the evenings. He would sometimes forget his evening dose of Sinemet. He still had some reservations about taking Sinemet because of fear of potential long-term effects and side effects. We mutually agreed to keep his Sinemet at 3 half pills a day for 3 doses, and one whole pill around 3 PM. He was advised to continue with Azilect once daily as well. I suggested he increase his Mirapex ER from 2.25 mg to 3 mg once daily. He was on brand-name Mirapex ER.   I saw him on 07/08/2014, at which time he reported more tremors, especially towards the end of the day. He reported more fatigue, and stiffness and slowness after 3 PM. He was  taking Azilect once daily and brand-name Mirapex long-acting. He was taking Sinemet half a pill 3 times a day. He continued to be very active and take care of his elderly mother, 65 years old, alternating her care with his brother. I suggested he continue with Azilect once daily and Mirapex ER but he increase Sinemet to half a pill at 7 AM, half a pill at 11 AM, 1 whole pill at 3 PM and half a pill at 7 PM.    I saw him on 01/05/2014, at which time he reported feeling worse with his posture and his walking  was slower. He felt stable with his mood and memory. He had mild constipation. He continued to work here and there. He reported no issues with driving. His mother continue to stay with him every other week and he was sharing care of her with his brother. His mother has dementia and is in her mid 46s. I suggested he continue with Azilect and Mirapex ER at the same doses but and a half pill of levodopa to his regimen.   I saw him on 06/16/2013, at which time he reported feeling stable, with the exception of occasional start hesitation and trouble turning. I encouraged him to increase his exercise to more daily walking exercises and kept his medications the same.   I first met him on 12/12/2012, at which time I felt that his exam was stable. I did not make any medication changes at the time but suggested he consider taking coenzyme Q 10.     He previously followed by Dr. Morene Antu and was last seen by him on 06/14/2012, and which time Dr. Erling Cruz felt that he was doing very well. He did not make any changes to his medication regimen.   He was diagnosed with idiopathic right-sided predominant Parkinson's disease in May 2007 and initially treated with long-acting Mirapex, rasagiline and levodopa. He exercises regularly. He has had some daytime somnolence. He snores at night. In February 2013 he went on disability. He denied compulsive behavior, orthostatic hypotension or edema. He has had some difficulty with  his memory. His mother has dementia at age 82. He lives alone, and takes care of his mother, who stays with him one week and with his only brother one week. He stays active and tends to sleep better when his mother stays with his brother as he can rest more and relax more. He has had no depression, anxiety, hallucinations, delusions or major memory problems, or dyskinesias.  His Past Medical History Is Significant For: Past Medical History:  Diagnosis Date  . Hypertension   . Parkinson disease ([place])     His Past Surgical History Is Significant For: Past Surgical History:  Procedure Laterality Date  . HERNIA REPAIR      His Family History Is Significant For: Family History  Problem Relation Age of Onset  . Heart attack Father     His Social History Is Significant For: Social History   Socioeconomic History  . Marital status: Single    Spouse name: Not on file  . Number of children: 3  . Years of education: 12th  . Highest education level: Not on file  Occupational History  . Occupation: N/A    Comment: retired  Tobacco Use  . Smoking status: Never Smoker  . Smokeless tobacco: Former Systems developer    Types: Chew  . Tobacco comment: quit 8 years ago  Substance and Sexual Activity  . Alcohol use: No  . Drug use: No  . Sexual activity: Not on file  Other Topics Concern  . Not on file  Social History Narrative   Patient is right handed, resides alone, mother resides with him 1/2 the time,has dementia   Social Determinants of Health   Financial Resource Strain:   . Difficulty of Paying Living Expenses:   Food Insecurity:   . Worried About Charity fundraiser in the Last Year:   . Arboriculturist in the Last Year:   Transportation Needs:   . Film/video editor (Medical):   Marland Kitchen Lack of Transportation (Non-Medical):  Physical Activity:   . Days of Exercise per Week:   . Minutes of Exercise per Session:   Stress:   . Feeling of Stress :   Social Connections:   .  Frequency of Communication with Friends and Family:   . Frequency of Social Gatherings with Friends and Family:   . Attends Religious Services:   . Active Member of Clubs or Organizations:   . Attends Archivist Meetings:   Marland Kitchen Marital Status:     His Allergies Are:  No Known Allergies:   His Current Medications Are:  Outpatient Encounter Medications as of 10/07/2019  Medication Sig  . carbidopa-levodopa (SINEMET CR) 50-200 MG tablet Take 1 tablet by mouth at bedtime.  . carbidopa-levodopa (SINEMET IR) 25-100 MG tablet Take 1 tablet by mouth 6 (six) times daily.  . pramipexole (MIRAPEX) 1 MG tablet Take 1 tablet (1 mg total) by mouth 3 (three) times daily.  . rasagiline (AZILECT) 1 MG TABS tablet Take 1 tablet (1 mg total) by mouth daily.   No facility-administered encounter medications on file as of 10/07/2019.  :  Review of Systems:  Out of a complete 14 point review of systems, all are reviewed and negative with the exception of these symptoms as listed below: Review of Systems  Neurological:       Here for f/u on Parkinson's. Reports he has been doing ok. No new issues or concerns to address today.     Objective:  Neurological Exam  Physical Exam Physical Examination:   Vitals:   10/07/19 0813  BP: (!) 144/76  Pulse: (!) 55    General Examination: The patient is a very pleasant 69 y.o. male in no acute distress. He appears well-developed and well-nourished and well groomed.   HEENT:Normocephalic, atraumatic, pupils are equal, round and reactive to light, extraocular tracking shows, mild saccadic breakdown without nystagmus noted. There is limitation to upper gaze. There is mild decrease in eye blink rate. Hearing is intact. Face is symmetric with mild facial masking and normal facial sensation. There is no lip, neck or jaw tremor. Neck is mild to moderately rigid,slight left head tilt, stable.Oropharynx exam reveals mild mouth dryness. No significant airway  crowding is noted. Mallampati is class II. Tongue protrudes centrally and palate elevates symmetrically. There is no drooling. Speech mild to moderately hypophonic, slightly hoarse sounding at times.  No dysarthria.  Chest:is clear to auscultation without wheezing, rhonchi or crackles noted.  Heart:sounds are regular and normal without murmurs, rubs or gallops noted.   Abdomen:is soft, non-tender and non-distended.   Extremities:There is no pitting edema in the distal lower extremities bilaterally.   Skin: is warm and dry with no trophic changes noted.   Musculoskeletal: exam reveals no obvious joint deformities, tenderness, joint swelling or erythema.  Neurologically:  Mental status: The patient is awake and alert, paying good attention. He is able to completely provide the history. He is oriented to: person, place, time/date, situation, day of week, month of year and year. His memory, attention, language and knowledge are intact. There is no aphasia, agnosia, apraxia or anomia. There is no bradyphrenia. Speech is hypophonic with no dysarthria noted. Mood is congruent and affect is normal.   Cranial nerves are as described above under HEENT exam. In addition, shoulder shrug is normal but L shoulder is slightly lower today.   Motor exam: Normal bulk, and strength for age is noted. There are mild intermittent left foot dyskinesias noted.    Tone is mildly  rigid with presence of cogwheeling in the right upper extremity. There is overall mildto moderatebradykinesia. There is no tremor today.   Reflexes are 1-2+ in the upper and lower extremities.  Fine motor skills exam:Fine motor skills aremild tomoderately impaired on the right and slightly better on the left, fairly stable.  Cerebellar testing shows no dysmetria or intention tremor on finger to nose testing. Heel to shin is unremarkable bilaterally. There is no truncal or gait ataxia.   Sensory exam is intact to  light touch in the UEs and LEs.  Gait, station and balance: He stands up from the seated position with minimal difficulty and does not need to push up with His hands. He needs no assistance. No veering to one side is noted. He is not noted to lean to the side. Posture is moderately stooped for age,perhaps slightly worse. Stance is initially somewhat wider based. He walks with fairly good pace, decreased stride length and decreased arm swing on the right, balance is fairly well-preserved. He turns with slight insecurityin about 3 steps.   Assessment and Plan:   In summary, IKEY OMARY is a very pleasant 69 year old male with an underlying medical history of hypertension and chest pain, status post hernia repair in 2012, who presents for followup consultation of his right-sided predominant Parkinson's disease, akinetic-rigid type, with diagnosisdatingback to 2007, symptoms perhaps dating back to 2006. With time, he has had mild progression, and had remained stable for years in the past.We have with timeincreased his Mirapex and also his Sinemet over time.His physical exam has remained fairly stable for quite some time in the past, but he has had some progression in his posture and stiffnesswith time. Hehas had issues with lower back and sciatica type symptoms, nows/p L spine surgery in 11/2017. He has done well after surgery for the most part, has some left-sided symptoms and may need additional surgery down the road.  From the Parkinson's standpoint he is doing well, he has had hypophonia, but declined ST referral in the past. We talked about the ongoing importance of being proactive about constipation issues and staying active mentally and physically, well hydrated as well. He has had more difficulty with starting movements, more so first thing in the morning.  He filled the prescription for Sinemet CR last year in November but did not end up taking it.  He is encouraged to try it.  He is  advised to try it consistently for at least 3 to 5 days to be able to tell the difference.  We talked about expectations, the dosing, and potential side effects.  He is advised to continue with his other medications including Sinemet IR, rasagiline once daily and Mirapex generic 1 mg 3 times daily. I suggested a six-month follow-up routinely, sooner if needed. I answered all his questions today and he was in agreement. I spent 25 minutes in total face-to-face time and in reviewing records during pre-charting, more than 50% of which was spent in counseling and coordination of care, reviewing test results, reviewing medications and treatment regimen and/or in discussing or reviewing the diagnosis of PD, the prognosis and treatment options. Pertinent laboratory and imaging test results that were available during this visit with the patient were reviewed by me and considered in my medical decision making (see chart for details).

## 2019-10-07 NOTE — Patient Instructions (Signed)
Your exam is fairly stable, posture probably just a little bit more stooped, but not by much.  Please continue to pursue a healthy lifestyle, good hydration with water, good nutrition, monitor your weight as you are borderline low with your weight.  Please monitor for constipation issues.  If you decide to try the Sinemet CR, long-acting Sinemet at bedtime, please do this consistently for at least 3 to 5 days to be able to tell a difference.  Let me know if you have any questions, we can see you back in 6 months, I have renewed all your other prescriptions including pramipexole, rasagiline and carbidopa-levodopa IR, immediate release.

## 2020-04-08 ENCOUNTER — Ambulatory Visit: Payer: Medicare Other | Admitting: Neurology

## 2020-04-08 ENCOUNTER — Telehealth: Payer: Self-pay | Admitting: Neurology

## 2020-04-08 NOTE — Telephone Encounter (Signed)
Pt called the morning of appt and cancelled due to not feeling well.

## 2020-05-04 ENCOUNTER — Encounter: Payer: Self-pay | Admitting: Neurology

## 2020-05-04 ENCOUNTER — Ambulatory Visit: Payer: Medicare Other | Admitting: Neurology

## 2020-05-04 VITALS — BP 136/80 | HR 69 | Ht 70.5 in | Wt 160.0 lb

## 2020-05-04 DIAGNOSIS — G20 Parkinson's disease: Secondary | ICD-10-CM | POA: Diagnosis not present

## 2020-05-04 MED ORDER — CARBIDOPA-LEVODOPA ER 50-200 MG PO TBCR: 1.0000 | EXTENDED_RELEASE_TABLET | Freq: Every day | ORAL | 3 refills | Status: DC

## 2020-05-04 NOTE — Progress Notes (Signed)
Subjective:    Patient ID: Jack Cobb is a 69 y.o. male.  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  He is unaccompanied today.  I last saw him on 10/07/2019, at which time he felt more or less stable, had filled the Sinemet CR to be taken at bedtime but did not actually take it.  I did encourage him to started and we mutually agreed to keep his Sinemet, Mirapex and Azilect the same.  Today, 05/04/2020: He reports that he ended up not trying this ER and now it is expired.  He would like to get a new prescription and intends to try it.  He notices that sometimes his medication wears off quicker than expected and sometimes a dose may not kick in fully.  He has noticed the occasional mild dyskinesias which do not bother him.  He has not had any falls.  He had a stomach virus in November.  His weight has been fluctuating but generally he is maintaining his weight.  He exercises regularly.  He tries to hydrate well.  He does not sleep very well, achieves about 4 to 6 hours of sleep with interruption, has to go to the bathroom.  He does feel stiff and less mobile in the middle of the night when he is to go to the bathroom.  He takes his immediate release levodopa 6 times a day, sometimes he ends up taking only 5 a day if he misses a dose and very rarely has he taken a 7th pill if he woke up really early that day and he was really active that day.  Constipation is not a big deal, he tries to increase his fiber intake and takes a stool softener rarely, typically no laxative.  He tries to hydrate well with water.  He has not taken the Covid vaccine but is considering it.  The patient's allergies, current medications, family history, past medical  history, past social history, past surgical history and problem list were reviewed and updated as appropriate.    Previously (copied from previous notes for reference):   I saw him on 04/09/2019, at which time he was taking Sinemet 6 times a day about 2-1/2 hourly, reported more difficulty getting started in the morning, more slowness.  He was advised to add Sinemet CR at bedtime.    I saw him in a virtual visit on 10/02/2018, at which time he reported feeling fairly stable.  He had some intermittent issues with constipation and his balance and mobility.  He was advised to continue with Sinemet 1 pill 6 times a day and Mirapex 1 pill 3 times a day as well as Azilect 1 pill once daily.    I saw him on 04/03/2018, at which time he reported doing well, he had had lumbar spine surgery under Dr. Trenton Gammon in July 2019 and had done well after it.  He was not on any pain medication.  He was trying to stay active.  Sinemet was 1 pill 5 or 6 times a day, more consistently 5 times a day.  He was taking Mirapex twice daily 1 mg strength and sometimes an additional half pill during the middle of the day.  He had tried hemp for back pain which he  found helpful, he was wondering about the usefulness of CBD oil.  I suggested we keep his medication regimen the same, including Azilect 1 mg once daily in the morning, Mirapex and Sinemet.  We talked about potentially doing speech therapy evaluation.       I saw him on 10/01/2017, at which time he reported doing okay from a Parkinsons standpoint, however, he started having more problems with shooting pains to the right leg and had an L spine MRI in December 2018. He started seeing a spine specialist. He had received injections which provided temporary relief.     I saw him on 04/02/2017, at which time he had issues with constipation, no cognitive issues, no recent falls, he did report that he may have pulled a muscle in the right hip area. We mutually agreed to continue his  current medication regimen of Azilect once daily, Mirapex ER once daily and Sinemet 1 pill 5 times a day.   He called in the interim reporting that the Mirapex ER was to expensive and we switched him over to Mirapex IR 1 mg 3 times a day.   I saw him on 09/27/2016, at which time he reported doing okay but felt like his levodopa was wearing off after 3 hours. He was taking half a pill in the morning, 1 pill midday, then another full pill about 3-4 hours later and half a pill in the evening. Is not exercising on a regular basis but overall was fairly active. He had some constipation intermittently. Memory was stable, some forgetfulness reported, some mood irritability was reported by his girlfriend. He was getting his Mirapex ER brand-name from [place]. He was taking Azilect generic once daily. I suggested we continue with Mirapex ER 3 mg daily and Azilect 1 mg daily. I suggested we increase his Sinemet to 1 pill 5 times a day. I asked him to exercise more regularly and be proactive about constipation issues.    Of note, the patient no showed for appointment on 07/12/2016 secondary to illness. I saw him on 01/10/2016, at which time he reported worsening of his posture in more stiffness. He had no significant problems with balance or constipation or mood. He was taking overall a low dose of Sinemet. I suggested he could continue with the current regimen but increase his second dose also to a whole pill. He was taking half a pill for 3 doses and 1 whole pill at 3 PM. I suggested he could try to increase his Sinemet. Overall, he felt fairly stable but I did notice some changes on exam as well.   I saw him on 07/12/2015, at which time he reported feeling stable. He did not notice much in the way of difference after the increase in the Mirapex long-acting. Overall, he was less stressed. He was able to transition his mother took class nursing home and she did quite well given her age of 76. He was sleeping a little  better not having to worry about his mother on the time. He was taking Sinemet half a pill 4 times a day sometimes a whole pill at 3 PM, sometimes would get only 3 doses and for the day. He is trying to stay active and trying to do better with his water intake as opposed to coffee and soda. I suggested he could consider alternating 1 pill with half a pill for a total of 3 pills daily, but he preferred to stay on the regimen of about 2 and half  pills daily.    I saw him on 01/06/2015, at which time he reported doing fairly well overall. He was active. He had some increased stiffness particularly in the mornings and later in the evenings. He would sometimes forget his evening dose of Sinemet. He still had some reservations about taking Sinemet because of fear of potential long-term effects and side effects. We mutually agreed to keep his Sinemet at 3 half pills a day for 3 doses, and one whole pill around 3 PM. He was advised to continue with Azilect once daily as well. I suggested he increase his Mirapex ER from 2.25 mg to 3 mg once daily. He was on brand-name Mirapex ER.   I saw him on 07/08/2014, at which time he reported more tremors, especially towards the end of the day. He reported more fatigue, and stiffness and slowness after 3 PM. He was taking Azilect once daily and brand-name Mirapex long-acting. He was taking Sinemet half a pill 3 times a day. He continued to be very active and take care of his elderly mother, 58 years old, alternating her care with his brother. I suggested he continue with Azilect once daily and Mirapex ER but he increase Sinemet to half a pill at 7 AM, half a pill at 11 AM, 1 whole pill at 3 PM and half a pill at 7 PM.    I saw him on 01/05/2014, at which time he reported feeling worse with his posture and his walking was slower. He felt stable with his mood and memory. He had mild constipation. He continued to work here and there. He reported no issues with driving. His mother  continue to stay with him every other week and he was sharing care of her with his brother. His mother has dementia and is in her mid 64s. I suggested he continue with Azilect and Mirapex ER at the same doses but and a half pill of levodopa to his regimen.   I saw him on 06/16/2013, at which time he reported feeling stable, with the exception of occasional start hesitation and trouble turning. I encouraged him to increase his exercise to more daily walking exercises and kept his medications the same.   I first met him on 12/12/2012, at which time I felt that his exam was stable. I did not make any medication changes at the time but suggested he consider taking coenzyme Q 10.     He previously followed by Dr. Morene Antu and was last seen by him on 06/14/2012, and which time Dr. Erling Cruz felt that he was doing very well. He did not make any changes to his medication regimen.   He was diagnosed with idiopathic right-sided predominant Parkinson's disease in May 2007 and initially treated with long-acting Mirapex, rasagiline and levodopa. He exercises regularly. He has had some daytime somnolence. He snores at night. In February 2013 he went on disability. He denied compulsive behavior, orthostatic hypotension or edema. He has had some difficulty with his memory. His mother has dementia at age 14. He lives alone, and takes care of his mother, who stays with him one week and with his only brother one week. He stays active and tends to sleep better when his mother stays with his brother as he can rest more and relax more. He has had no depression, anxiety, hallucinations, delusions or major memory problems, or dyskinesias.    His Past Medical History Is Significant For: Past Medical History:  Diagnosis Date   Hypertension  Parkinson disease ([place])     His Past Surgical History Is Significant For: Past Surgical History:  Procedure Laterality Date   HERNIA REPAIR      His Family History Is Significant  For: Family History  Problem Relation Age of Onset   Heart attack Father     His Social History Is Significant For: Social History   Socioeconomic History   Marital status: Single    Spouse name: Not on file   Number of children: 3   Years of education: 12th   Highest education level: Not on file  Occupational History   Occupation: N/A    Comment: retired  Tobacco Use   Smoking status: Never Smoker   Smokeless tobacco: Former Systems developer    Types: Chew   Tobacco comment: quit 8 years ago  Substance and Sexual Activity   Alcohol use: No   Drug use: No   Sexual activity: Not on file  Other Topics Concern   Not on file  Social History Narrative   Patient is right handed, resides alone, mother resides with him 1/2 the time,has dementia   Social Determinants of Health   Financial Resource Strain: Not on file  Food Insecurity: Not on file  Transportation Needs: Not on file  Physical Activity: Not on file  Stress: Not on file  Social Connections: Not on file    His Allergies Are:  No Known Allergies:   His Current Medications Are:  Outpatient Encounter Medications as of 05/04/2020  Medication Sig   carbidopa-levodopa (SINEMET CR) 50-200 MG tablet Take 1 tablet by mouth at bedtime.   carbidopa-levodopa (SINEMET IR) 25-100 MG tablet Take 1 tablet by mouth 6 (six) times daily.   pramipexole (MIRAPEX) 1 MG tablet Take 1 tablet (1 mg total) by mouth 3 (three) times daily.   rasagiline (AZILECT) 1 MG TABS tablet Take 1 tablet (1 mg total) by mouth daily.   No facility-administered encounter medications on file as of 05/04/2020.  :  Review of Systems:  Out of a complete 14 point review of systems, all are reviewed and negative with the exception of these symptoms as listed below: Review of Systems  Neurological:       Pt presents today to discuss his PD. Pt reports that he has been fairly stable. Pt denies any falls.    Objective:  Neurological  Exam  Physical Exam Physical Examination:   Vitals:   05/04/20 0934  BP: 136/80  Pulse: 69    General Examination: The patient is a very pleasant 69 y.o. male in no acute distress. He appears well-developed and well-nourished and well groomed.   HEENT:Normocephalic, atraumatic, pupils are equal, round and reactive to light, extraocular tracking shows, mild saccadic breakdown without nystagmus noted. There is limitation to upper gaze. There is mild decrease in eye blink rate. Hearing is intact. Face is symmetric with mild facial masking and normal facial sensation. There is no lip, neck or jaw tremor. Neck is mildly rigid,slight left head tilt, stable.Oropharynx exam reveals mild mouth dryness. No significant airway crowding is noted. Mallampati is class II. Tongue protrudes centrally and palate elevates symmetrically. There is no drooling. Speech mild to moderately hypophonic, slightly hoarse sounding at times.  No dysarthria.  Chest:is clear to auscultation without wheezing, rhonchi or crackles noted.  Heart:sounds are regular and normal without murmurs, rubs or gallops noted.   Abdomen:is soft, non-tender and non-distended.   Extremities:There is no pitting edema in the distal lower extremities bilaterally.   Skin:  is warm and dry with no trophic changes noted.   Musculoskeletal: exam reveals no obvious joint deformities, tenderness, joint swelling or erythema.  Neurologically:  Mental status: The patient is awake and alert, paying good attention. He is able to completely provide the history. He is oriented to: person, place, time/date, situation, day of week, month of year and year. His memory, attention, language and knowledge are intact. There is no aphasia, agnosia, apraxia or anomia. There is no bradyphrenia. Speech is hypophonic with no dysarthria noted. Mood is congruent and affect is normal.   Cranial nerves are as described above under HEENT exam. In  addition, shoulder shrug is normal but L shoulder is slightly lower than right. .   Motor exam: Normal bulk, and strength for age is noted. There are mild intermittent left foot dyskinesias noted, appear to be stable.    Tone is mildly rigid with presence of cogwheeling in the right upper extremity. There is overall mildto moderatebradykinesia. There is no obvious resting tremor.    Fine motor skills exam:Fine motor skills aremild tomoderately impaired on the right and slightly better on the left, fairly stable.  Cerebellar testing shows no dysmetria or intention tremor on finger to nose testing. Heel to shin is unremarkable bilaterally. There is no truncal or gait ataxia.   Sensory exam is intact to light touch in the UEs and LEs.  Gait, station and balance: He stands up from the seated position with minimal difficulty and does not need to push up with His hands. He needs no assistance. No veering to one side is noted. He is not noted to lean to the side. Posture is moderately stooped for age,but appears to be stable from last time. Stance is initially somewhat wider based. He walks with fairly good pace, decreased stride length and decreased arm swing on the right, balance is fairly well-preserved. He turns well today.    Assessment and Plan:   In summary, Jack Cobb is a very pleasant 69 year old male with an underlying medical history of hypertension and chest pain, status post hernia repair in 2012, who presents for followup consultation of his right-sided predominant Parkinson's disease, akinetic-rigid type, with diagnosisdatingback to 2007, symptoms perhaps dating back to 2006. With time, he has hadmild progression,but had remained stable for years in the past.We have with timeincreased his Mirapex and also his Sinemet over time.  He is currently on Mirapex 1 mg 3 times daily and Sinemet 25-100 mg strength 1 pill 6 times a day, approximately every 2-1/2 hours.  He has  had instances of wearing off and not feeling that the medication has continued well enough.  He has had some mild dyskinesias over time. He has had some progression in his posture and stiffnesswith time. Hehas had issues with lower back and sciatica type symptoms, nows/p L spine surgery in 11/2017. He has done well after surgery for the most part, has some left-sided symptoms and may need additional surgery down the road.  From the Parkinson's standpoint he is doing well, hehas had hypophonia, but declined ST referral in the past.We talked about theongoingimportance of being proactive about constipation issues and staying active mentally and physically, and well hydrated with water.  He has had more difficulty with stiffness and mobility issues in the middle of the night.  He had failed the Sinemet CR for nighttime use or bedtime use but had not tried it in the prescription is expired.  He would like to get a new prescription  which I provided.  For now, we will continue to monitor his symptoms and examination and follow-up routinely in 6 months, sooner if needed.   I answered all his questions today and he was in agreement. I spent 30 minutes in total face-to-face time and in reviewing records during pre-charting, more than 50% of which was spent in counseling and coordination of care, reviewing test results, reviewing medications and treatment regimen and/or in discussing or reviewing the diagnosis of PD, the prognosis and treatment options. Pertinent laboratory and imaging test results that were available during this visit with the patient were reviewed by me and considered in my medical decision making (see chart for details).

## 2020-05-04 NOTE — Patient Instructions (Signed)
I think overall, you are fairly stable but you do have valid concerns including wearing off of your levodopa and being more stiff especially at night and sometimes the medication not kicking in as well.  I think we can monitor on these symptoms for now and continue with the medication regimen as is but I do want you to try the Sinemet CR which is a long-acting bedtime dose of levodopa.  I think it will help reduce the stiffness and improve your mobility in the middle of the night especially since you do have to get up to use the bathroom.  Please continue to exercise on a regular basis, stay well rested, well hydrated, and monitor for constipation issues.  Please follow-up routinely in 6 months, sooner if needed.

## 2020-06-12 ENCOUNTER — Other Ambulatory Visit: Payer: Self-pay

## 2020-06-12 ENCOUNTER — Observation Stay (HOSPITAL_COMMUNITY)
Admission: EM | Admit: 2020-06-12 | Discharge: 2020-06-14 | Disposition: A | Discharge status: Home / Self Care | Payer: Medicare Other | Attending: Internal Medicine | Admitting: Internal Medicine

## 2020-06-12 ENCOUNTER — Emergency Department (HOSPITAL_COMMUNITY): Payer: Medicare Other

## 2020-06-12 DIAGNOSIS — R748 Abnormal levels of other serum enzymes: Secondary | ICD-10-CM | POA: Insufficient documentation

## 2020-06-12 DIAGNOSIS — Z7982 Long term (current) use of aspirin: Secondary | ICD-10-CM | POA: Insufficient documentation

## 2020-06-12 DIAGNOSIS — I480 Paroxysmal atrial fibrillation: Secondary | ICD-10-CM | POA: Diagnosis present

## 2020-06-12 DIAGNOSIS — Z79899 Other long term (current) drug therapy: Secondary | ICD-10-CM | POA: Insufficient documentation

## 2020-06-12 DIAGNOSIS — Z87891 Personal history of nicotine dependence: Secondary | ICD-10-CM | POA: Diagnosis not present

## 2020-06-12 DIAGNOSIS — I10 Essential (primary) hypertension: Secondary | ICD-10-CM | POA: Insufficient documentation

## 2020-06-12 DIAGNOSIS — Z20822 Contact with and (suspected) exposure to covid-19: Secondary | ICD-10-CM | POA: Diagnosis not present

## 2020-06-12 DIAGNOSIS — R778 Other specified abnormalities of plasma proteins: Secondary | ICD-10-CM

## 2020-06-12 DIAGNOSIS — G20 Parkinson's disease: Secondary | ICD-10-CM | POA: Diagnosis not present

## 2020-06-12 DIAGNOSIS — I4891 Unspecified atrial fibrillation: Principal | ICD-10-CM | POA: Insufficient documentation

## 2020-06-12 LAB — COMPREHENSIVE METABOLIC PANEL
ALT: 8 U/L (ref 0–44)
AST: 19 U/L (ref 15–41)
Albumin: 3.9 g/dL (ref 3.5–5.0)
Alkaline Phosphatase: 72 U/L (ref 38–126)
Anion gap: 10 (ref 5–15)
BUN: 16 mg/dL (ref 8–23)
CO2: 25 mmol/L (ref 22–32)
Calcium: 9 mg/dL (ref 8.9–10.3)
Chloride: 107 mmol/L (ref 98–111)
Creatinine, Ser: 1.09 mg/dL (ref 0.61–1.24)
GFR, Estimated: >60 mL/min (ref >60)
Glucose, Bld: 137 mg/dL — ABNORMAL HIGH (ref 70–99)
Potassium: 3.5 mmol/L (ref 3.5–5.1)
Sodium: 142 mmol/L (ref 135–145)
Total Bilirubin: 0.6 mg/dL (ref 0.3–1.2)
Total Protein: 6.5 g/dL (ref 6.5–8.1)

## 2020-06-12 LAB — MAGNESIUM: Magnesium: 1.9 mg/dL (ref 1.7–2.4)

## 2020-06-12 LAB — I-STAT CHEM 8, ED
BUN: 20 mg/dL (ref 8–23)
Calcium, Ion: 1.15 mmol/L (ref 1.15–1.40)
Chloride: 105 mmol/L (ref 98–111)
Creatinine, Ser: 1 mg/dL (ref 0.61–1.24)
Glucose, Bld: 129 mg/dL — ABNORMAL HIGH (ref 70–99)
HCT: 45 % (ref 39.0–52.0)
Hemoglobin: 15.3 g/dL (ref 13.0–17.0)
Potassium: 3.6 mmol/L (ref 3.5–5.1)
Sodium: 142 mmol/L (ref 135–145)
TCO2: 28 mmol/L (ref 22–32)

## 2020-06-12 LAB — CBC WITH DIFFERENTIAL/PLATELET
Abs Immature Granulocytes: 0.04 10*3/uL (ref 0.00–0.07)
Basophils Absolute: 0 10*3/uL (ref 0.0–0.1)
Basophils Relative: 1 %
Eosinophils Absolute: 0.1 10*3/uL (ref 0.0–0.5)
Eosinophils Relative: 1 %
HCT: 46.4 % (ref 39.0–52.0)
Hemoglobin: 15.5 g/dL (ref 13.0–17.0)
Immature Granulocytes: 1 %
Lymphocytes Relative: 17 %
Lymphs Abs: 1.3 10*3/uL (ref 0.7–4.0)
MCH: 31.1 pg (ref 26.0–34.0)
MCHC: 33.4 g/dL (ref 30.0–36.0)
MCV: 93 fL (ref 80.0–100.0)
Monocytes Absolute: 0.6 10*3/uL (ref 0.1–1.0)
Monocytes Relative: 8 %
Neutro Abs: 5.8 10*3/uL (ref 1.7–7.7)
Neutrophils Relative %: 72 %
Platelets: 240 10*3/uL (ref 150–400)
RBC: 4.99 MIL/uL (ref 4.22–5.81)
RDW: 13 % (ref 11.5–15.5)
WBC: 7.9 10*3/uL (ref 4.0–10.5)
nRBC: 0 % (ref 0.0–0.2)

## 2020-06-12 LAB — TROPONIN I (HIGH SENSITIVITY): Troponin I (High Sensitivity): 30 ng/L — ABNORMAL HIGH (ref <18)

## 2020-06-12 LAB — TSH: TSH: 2.197 u[IU]/mL (ref 0.350–4.500)

## 2020-06-12 IMAGING — DX DG CHEST 1V PORT
1 series · 1 of 1 positions shown · non-contrast
Comparison: 04/20/2006

CLINICAL DATA: Atrial fibrillation

EXAM:
PORTABLE CHEST 1 VIEW

[chest ap]
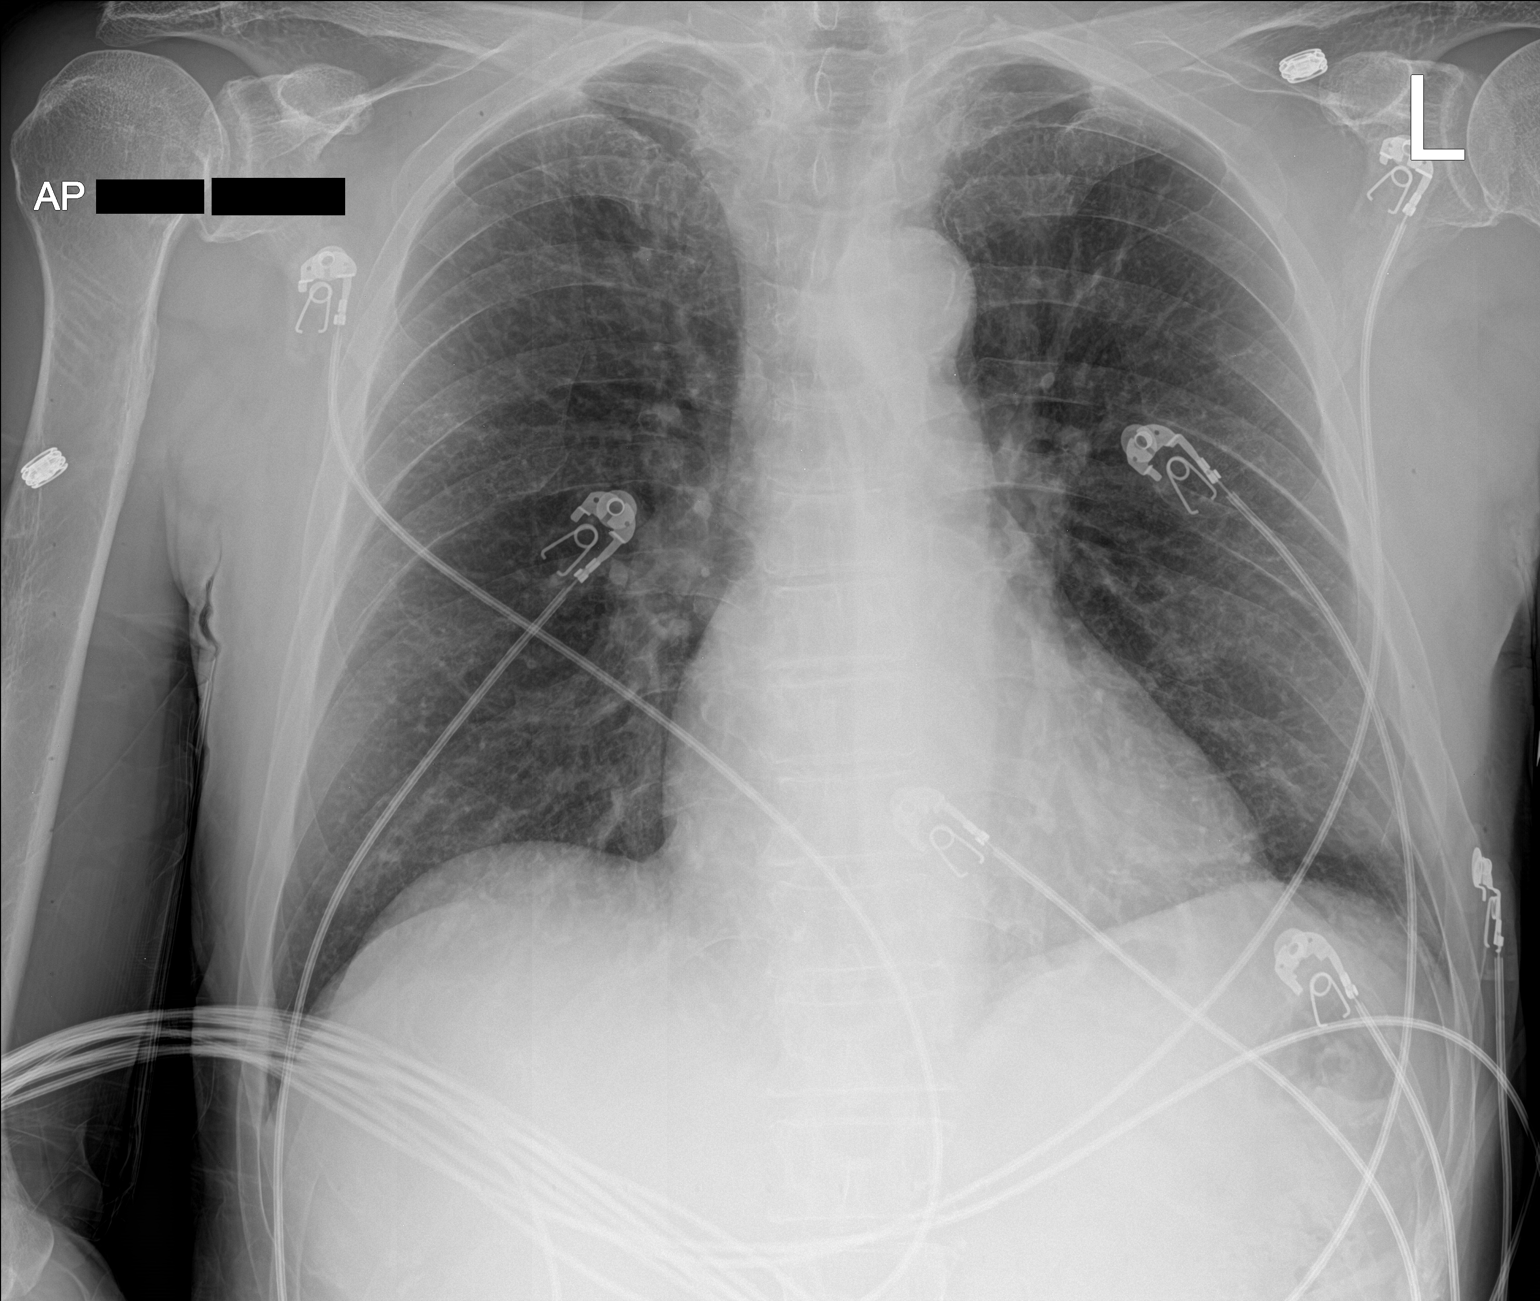

[1 of 1 positions shown; findings below may reference images not displayed]

FINDINGS: Single frontal view of the chest demonstrates an unremarkable
cardiac silhouette. No airspace disease, effusion, or pneumothorax.
No acute bony abnormalities.
IMPRESSION: 1. No acute intrathoracic process.

## 2020-06-12 MED ORDER — DILTIAZEM HCL-DEXTROSE 125-5 MG/125ML-% IV SOLN (PREMIX)
5.0000 mg/h | INTRAVENOUS | Status: DC
  Administered 2020-06-12: 23:00:00 5 mg/h via INTRAVENOUS
  Administered 2020-06-13: 10 mg/h via INTRAVENOUS
  Filled 2020-06-12 (×2): qty 125

## 2020-06-12 MED ORDER — APIXABAN 5 MG PO TABS
5.0000 mg | ORAL_TABLET | Freq: Two times a day (BID) | ORAL | Status: DC
  Administered 2020-06-12 – 2020-06-14 (×5): 5 mg via ORAL
  Filled 2020-06-12 (×5): qty 1

## 2020-06-12 MED ORDER — SODIUM CHLORIDE 0.9 % IV BOLUS
1000.0000 mL | Freq: Once | INTRAVENOUS | Status: AC
  Administered 2020-06-12: 1000 mL via INTRAVENOUS

## 2020-06-12 NOTE — ED Triage Notes (Signed)
Pt came by EMS due to new onset of afib. Pt states he felt like his heart was racing and could not get his heart to slow down. EMS states HR has been to 140-180. EMS gave pt 10mg  cardizem x2 and 500 mL NS. Pt is axox4. Pt is stable at this time.

## 2020-06-12 NOTE — ED Provider Notes (Signed)
[facility] EMERGENCY DEPARTMENT Provider Note   CSN: [id number] Arrival date & time: 06/12/20  2201     History Chief Complaint  Patient presents with  . Atrial Fibrillation    Jack Cobb is a 70 y.o. male.  The history is provided by the patient and medical records. No language interpreter was used.  Atrial Fibrillation     70 year old male with significant history of hypertension, Parkinson's disease, brought here via EMS from home with concerns of heart palpitation.  Per EMS, patient report his heart was racing and when they first arrived, heart rate was in between 140-180.  Patient was given 10 mg of Cardizem x2 as well as 100 mL of normal saline with improvement of his heart rate.  Patient was brought here for further evaluation.  Patient states he was sitting watching TV approximately 2 hours ago when he developed heart racing sensation and felt very uncomfortable.  Symptom has been persistent since.  This is new for him.  He does not complain of any lightheadedness or dizziness no chest pain or shortness of breath no focal numbness or weakness.  He denies any recent sickness.  He previously had his Parkinson medication changed 2 weeks ago but he reverted back after a few days.  He does not think it contribute to his symptoms.  Denies any change in dietary or consumptions of any energy drinks.  Denies alcohol or tobacco abuse.  No recent sickness.  He has not been vaccinated for COVID-19.  He has never had atrial fibrillation before.  He does take baby aspirin.  Past Medical History:  Diagnosis Date  . Hypertension   . Parkinson disease [facility])     Patient Active Problem List   Diagnosis Date Noted  . Parkinson's disease (HCC) 12/12/2012    Past Surgical History:  Procedure Laterality Date  . HERNIA REPAIR         Family History  Problem Relation Age of Onset  . Heart attack Father     Social History   Tobacco Use  . Smoking status: Never  Smoker  . Smokeless tobacco: Former Neurosurgeon    Types: Chew  . Tobacco comment: quit 8 years ago  Substance Use Topics  . Alcohol use: No  . Drug use: No    Home Medications Prior to Admission medications   Medication Sig Start Date End Date Taking? Authorizing Provider  carbidopa-levodopa (SINEMET CR) 50-200 MG tablet Take 1 tablet by mouth at bedtime. 05/04/20   Huston Foley, MD  carbidopa-levodopa (SINEMET IR) 25-100 MG tablet Take 1 tablet by mouth 6 (six) times daily. 10/07/19   Huston Foley, MD  pramipexole (MIRAPEX) 1 MG tablet Take 1 tablet (1 mg total) by mouth 3 (three) times daily. 10/07/19   Huston Foley, MD  rasagiline (AZILECT) 1 MG TABS tablet Take 1 tablet (1 mg total) by mouth daily. 10/07/19   Huston Foley, MD    Allergies    Patient has no known allergies.  Review of Systems   Review of Systems  All other systems reviewed and are negative.   Physical Exam Updated Vital Signs BP (!) 138/115   Pulse (!) 123   Temp 97.7 F (36.5 C) (Oral)   Resp 16   SpO2 98%   Physical Exam Vitals and nursing note reviewed.  Constitutional:      General: He is not in acute distress.    Appearance: He is well-developed and well-nourished.  HENT:  Head: Atraumatic.  Eyes:     Conjunctiva/sclera: Conjunctivae normal.  Neck:     Comments: No thyromegaly Cardiovascular:     Rate and Rhythm: Tachycardia present. Rhythm irregular.     Pulses: Normal pulses.     Heart sounds: Normal heart sounds.  Pulmonary:     Effort: Pulmonary effort is normal.     Breath sounds: Normal breath sounds. No wheezing, rhonchi or rales.  Abdominal:     Palpations: Abdomen is soft.     Tenderness: There is no abdominal tenderness.  Musculoskeletal:        General: Normal range of motion.     Cervical back: Neck supple.     Right lower leg: No edema.     Left lower leg: No edema.  Skin:    General: Skin is warm.     Findings: No rash.  Neurological:     Mental Status: He is alert and  oriented to person, place, and time.  Psychiatric:        Mood and Affect: Mood and affect and mood normal.     ED Results / Procedures / Treatments   Labs (all labs ordered are listed, but only abnormal results are displayed) Labs Reviewed  COMPREHENSIVE METABOLIC PANEL - Abnormal; Notable for the following components:      Result Value   Glucose, Bld 137 (*)    All other components within normal limits  I-STAT CHEM 8, ED - Abnormal; Notable for the following components:   Glucose, Bld 129 (*)    All other components within normal limits  TROPONIN I (HIGH SENSITIVITY) - Abnormal; Notable for the following components:   Troponin I (High Sensitivity) 30 (*)    All other components within normal limits  SARS CORONAVIRUS 2 (TAT 6-24 HRS)  CBC WITH DIFFERENTIAL/PLATELET  MAGNESIUM  TSH  URINALYSIS, ROUTINE W REFLEX MICROSCOPIC    EKG EKG Interpretation  Date/Time:  Saturday June 12 2020 22:22:20 EST Ventricular Rate:  132 PR Interval:    QRS Duration: 75 QT Interval:  313 QTC Calculation: 464 R Axis:   68 Text Interpretation: Atrial fibrillation Borderline repolarization abnormality last tracing showed sinus rhythm Confirmed by Richardean Canal [phone]) on 06/12/2020 10:36:41 PM   Radiology DG Chest Portable 1 View  Result Date: 06/12/2020 CLINICAL DATA:  Atrial fibrillation EXAM: PORTABLE CHEST 1 VIEW COMPARISON:  04/20/2006 FINDINGS: Single frontal view of the chest demonstrates an unremarkable cardiac silhouette. No airspace disease, effusion, or pneumothorax. No acute bony abnormalities. IMPRESSION: 1. No acute intrathoracic process. Electronically Signed   By: Sharlet Salina M.D.   On: 06/12/2020 22:50    Procedures .Critical Care Performed by: Fayrene Helper, PA-C Authorized by: Fayrene Helper, PA-C   Critical care provider statement:    Critical care time (minutes):  40   Critical care was time spent personally by me on the following activities:  Discussions with  consultants, evaluation of patient's response to treatment, examination of patient, ordering and performing treatments and interventions, ordering and review of laboratory studies, ordering and review of radiographic studies, pulse oximetry, re-evaluation of patient's condition, obtaining history from patient or surrogate and review of old charts   (including critical care time)  Medications Ordered in ED Medications  sodium chloride 0.9 % bolus 1,000 mL (has no administration in time range)    ED Course  I have reviewed the triage vital signs and the nursing notes.  Pertinent labs & imaging results that were available during my care of  the patient were reviewed by me and considered in my medical decision making (see chart for details).    MDM Rules/Calculators/A&P                          BP (!) 138/115   Pulse (!) 123   Temp 97.7 F (36.5 C) (Oral)   Resp 16   SpO2 98%   Final Clinical Impression(s) / ED Diagnoses Final diagnoses:  New onset atrial fibrillation (HCC)  Atrial fibrillation with rapid ventricular response (HCC)    Rx / DC Orders ED Discharge Orders    None     10:34 PM Patient presents with new onset A. fib that started approximately 2 to 3 hours ago.  No precipitating factors.  His heart rate is approximately 130-150 at this time, and EKG did demonstrate finding consistent with atrial fibrillation with RVR.  He did receive 2 doses of diltiazem 10 mg prior to arrival as well as 500 cc of IV fluid.  He is within the window for cardioversion however will give diltiazem drips along with IV fluid.    12:09 AM Patient is still on the Cardizem drips likely a rates of 10 mg/h heart rate is still showing 120-130 bpm. Blood pressure still stable. Patient received Eliquis as his CHA2DS2-VASc score was 2. I did offer cardioversion but patient prefers to stay on the drip instead. Appreciate consultation from [facility] hospital[facility], Dr. Drexel Iha who agrees to see and admit  patient for further evaluation of his new onset A. fib. Care discussed with Dr. Silverio Lay.  Screening covid test ordered.     Fayrene Helper, PA-C 06/13/20 0011    Mesner, Barbara Cower, MD 06/13/20 [phone]

## 2020-06-12 NOTE — ED Provider Notes (Incomplete)
[facility] EMERGENCY DEPARTMENT Provider Note   CSN: [id number] Arrival date & time: 06/12/20  2201     History Chief Complaint  Patient presents with  . Atrial Fibrillation    Jack Cobb is a 70 y.o. male.  The history is provided by the patient and medical records. No language interpreter was used.  Atrial Fibrillation     70 year old male with significant history of hypertension, Parkinson's disease, brought here via EMS from home with concerns of heart palpitation.  Per EMS, patient report his heart was racing and when they first arrived, heart rate was in between 140-180.  Patient was given 10 mg of Cardizem x2 as well as 100 mL of normal saline with improvement of his heart rate.  Patient was brought here for further evaluation.  Patient states he was sitting watching TV approximately 2 hours ago when he developed heart racing sensation and felt very uncomfortable.  Symptom has been persistent since.  This is new for him.  He does not complain of any lightheadedness or dizziness no chest pain or shortness of breath no focal numbness or weakness.  He denies any recent sickness.  He previously had his Parkinson medication changed 2 weeks ago but he reverted back after a few days.  He does not think it contribute to his symptoms.  Denies any change in dietary or consumptions of any energy drinks.  Denies alcohol or tobacco abuse.  No recent sickness.  He has not been vaccinated for COVID-19.  He has never had atrial fibrillation before.  He does take baby aspirin.  Past Medical History:  Diagnosis Date  . Hypertension   . Parkinson disease [facility])     Patient Active Problem List   Diagnosis Date Noted  . Parkinson's disease (HCC) 12/12/2012    Past Surgical History:  Procedure Laterality Date  . HERNIA REPAIR         Family History  Problem Relation Age of Onset  . Heart attack Father     Social History   Tobacco Use  . Smoking status: Never  Smoker  . Smokeless tobacco: Former Neurosurgeon    Types: Chew  . Tobacco comment: quit 8 years ago  Substance Use Topics  . Alcohol use: No  . Drug use: No    Home Medications Prior to Admission medications   Medication Sig Start Date End Date Taking? Authorizing Provider  carbidopa-levodopa (SINEMET CR) 50-200 MG tablet Take 1 tablet by mouth at bedtime. 05/04/20   Huston Foley, MD  carbidopa-levodopa (SINEMET IR) 25-100 MG tablet Take 1 tablet by mouth 6 (six) times daily. 10/07/19   Huston Foley, MD  pramipexole (MIRAPEX) 1 MG tablet Take 1 tablet (1 mg total) by mouth 3 (three) times daily. 10/07/19   Huston Foley, MD  rasagiline (AZILECT) 1 MG TABS tablet Take 1 tablet (1 mg total) by mouth daily. 10/07/19   Huston Foley, MD    Allergies    Patient has no known allergies.  Review of Systems   Review of Systems  All other systems reviewed and are negative.   Physical Exam Updated Vital Signs There were no vitals taken for this visit.  Physical Exam Vitals and nursing note reviewed.  Constitutional:      General: He is not in acute distress.    Appearance: He is well-developed and well-nourished.  HENT:     Head: Atraumatic.  Eyes:     Conjunctiva/sclera: Conjunctivae normal.  Neck:  Comments: No thyromegaly Cardiovascular:     Rate and Rhythm: Tachycardia present. Rhythm irregular.     Pulses: Normal pulses.     Heart sounds: Normal heart sounds.  Pulmonary:     Effort: Pulmonary effort is normal.     Breath sounds: Normal breath sounds. No wheezing, rhonchi or rales.  Abdominal:     Palpations: Abdomen is soft.     Tenderness: There is no abdominal tenderness.  Musculoskeletal:        General: Normal range of motion.     Cervical back: Neck supple.     Right lower leg: No edema.     Left lower leg: No edema.  Skin:    General: Skin is warm.     Findings: No rash.  Neurological:     Mental Status: He is alert and oriented to person, place, and time.   Psychiatric:        Mood and Affect: Mood and affect and mood normal.     ED Results / Procedures / Treatments   Labs (all labs ordered are listed, but only abnormal results are displayed) Labs Reviewed  CBC WITH DIFFERENTIAL/PLATELET  COMPREHENSIVE METABOLIC PANEL  MAGNESIUM  I-STAT CHEM 8, ED  TROPONIN I (HIGH SENSITIVITY)    EKG None  Radiology No results found.  Procedures Procedures (including critical care time)  Medications Ordered in ED Medications  sodium chloride 0.9 % bolus 1,000 mL (has no administration in time range)    ED Course  I have reviewed the triage vital signs and the nursing notes.  Pertinent labs & imaging results that were available during my care of the patient were reviewed by me and considered in my medical decision making (see chart for details).    MDM Rules/Calculators/A&P                          *** Final Clinical Impression(s) / ED Diagnoses Final diagnoses:  None    Rx / DC Orders ED Discharge Orders    None     10:34 PM Patient presents with new onset A. fib that started approximately 2 to 3 hours ago.  No precipitating factors.  His heart rate is approximately 130-150 at this time, and EKG did demonstrate finding consistent with atrial fibrillation with RVR.  He did receive 2 doses of diltiazem 10 mg prior to arrival as well as 500 cc of IV fluid.  He is within the window for cardioversion however will give diltiazem drips along with IV fluid.

## 2020-06-13 ENCOUNTER — Observation Stay (HOSPITAL_BASED_OUTPATIENT_CLINIC_OR_DEPARTMENT_OTHER): Payer: Medicare Other

## 2020-06-13 DIAGNOSIS — R778 Other specified abnormalities of plasma proteins: Secondary | ICD-10-CM

## 2020-06-13 DIAGNOSIS — I10 Essential (primary) hypertension: Secondary | ICD-10-CM | POA: Diagnosis not present

## 2020-06-13 DIAGNOSIS — Z20822 Contact with and (suspected) exposure to covid-19: Secondary | ICD-10-CM | POA: Diagnosis not present

## 2020-06-13 DIAGNOSIS — I4891 Unspecified atrial fibrillation: Secondary | ICD-10-CM | POA: Diagnosis present

## 2020-06-13 DIAGNOSIS — I480 Paroxysmal atrial fibrillation: Secondary | ICD-10-CM | POA: Diagnosis present

## 2020-06-13 DIAGNOSIS — G20 Parkinson's disease: Secondary | ICD-10-CM | POA: Diagnosis not present

## 2020-06-13 LAB — URINALYSIS, ROUTINE W REFLEX MICROSCOPIC
Bilirubin Urine: NEGATIVE
Glucose, UA: NEGATIVE mg/dL
Hgb urine dipstick: NEGATIVE
Ketones, ur: NEGATIVE mg/dL
Leukocytes,Ua: NEGATIVE
Nitrite: NEGATIVE
Protein, ur: NEGATIVE mg/dL
Specific Gravity, Urine: 1.01 (ref 1.005–1.030)
pH: 7 (ref 5.0–8.0)

## 2020-06-13 LAB — SARS CORONAVIRUS 2 (TAT 6-24 HRS): SARS Coronavirus 2: NEGATIVE

## 2020-06-13 LAB — ECHOCARDIOGRAM COMPLETE
Area-P 1/2: 3.6 cm2
Height: 70.5 in
S' Lateral: 2.6 cm
Weight: 2480 oz

## 2020-06-13 LAB — HIV ANTIBODY (ROUTINE TESTING W REFLEX): HIV Screen 4th Generation wRfx: NONREACTIVE

## 2020-06-13 LAB — D-DIMER, QUANTITATIVE: D-Dimer, Quant: <0.27 ug/mL-FEU (ref 0.00–0.50)

## 2020-06-13 LAB — TROPONIN I (HIGH SENSITIVITY)
Troponin I (High Sensitivity): 65 ng/L — ABNORMAL HIGH (ref <18)
Troponin I (High Sensitivity): 54 ng/L — ABNORMAL HIGH (ref <18)

## 2020-06-13 MED ORDER — CARBIDOPA-LEVODOPA 25-100 MG PO TABS
1.0000 | ORAL_TABLET | Freq: Every day | ORAL | Status: DC
  Administered 2020-06-13 – 2020-06-14 (×12): 1 via ORAL
  Filled 2020-06-13 (×15): qty 1

## 2020-06-13 MED ORDER — DILTIAZEM HCL 60 MG PO TABS
30.0000 mg | ORAL_TABLET | Freq: Four times a day (QID) | ORAL | Status: DC
  Administered 2020-06-13 – 2020-06-14 (×5): 30 mg via ORAL
  Filled 2020-06-13 (×5): qty 1

## 2020-06-13 MED ORDER — ACETAMINOPHEN 325 MG PO TABS: 650.0000 mg | ORAL_TABLET | ORAL | Status: DC | PRN

## 2020-06-13 MED ORDER — RASAGILINE MESYLATE 1 MG PO TABS
1.0000 mg | ORAL_TABLET | Freq: Every day | ORAL | Status: DC
  Administered 2020-06-13 – 2020-06-14 (×2): 1 mg via ORAL
  Filled 2020-06-13 (×3): qty 1

## 2020-06-13 MED ORDER — OFF THE BEAT BOOK
Freq: Once | Status: AC
  Filled 2020-06-13: qty 1

## 2020-06-13 MED ORDER — PRAMIPEXOLE DIHYDROCHLORIDE 1 MG PO TABS
1.0000 mg | ORAL_TABLET | Freq: Three times a day (TID) | ORAL | Status: DC
Start: 2020-06-13 — End: 2020-06-15
  Administered 2020-06-13 – 2020-06-14 (×5): 1 mg via ORAL
  Filled 2020-06-13 (×7): qty 1

## 2020-06-13 NOTE — ED Notes (Signed)
Attempted report x1. 

## 2020-06-13 NOTE — Plan of Care (Signed)
  Problem: Education: Goal: Knowledge of disease or condition will improve Outcome: Progressing Goal: Understanding of medication regimen will improve Outcome: Progressing   Problem: Activity: Goal: Ability to tolerate increased activity will improve Outcome: Progressing   Problem: Cardiac: Goal: Ability to achieve and maintain adequate cardiopulmonary perfusion will improve Outcome: Progressing   Problem: Nutrition: Goal: Adequate nutrition will be maintained Outcome: Progressing

## 2020-06-13 NOTE — H&P (Signed)
History and Physical    Jack Cobb MPN:[id number] DOB: [birth year] DOA: 06/12/2020  PCP: Aida Puffer, MD Patient coming from: Home  Chief Complaint: Palpitations  HPI: Jack Cobb is a 70 y.o. male with medical history significant of hypertension, Parkinson's disease presenting to the ED via EMS for evaluation of palpitations.  In A. fib with EMS with heart rate between 140-180.  EMS gave 10 mg Cardizem x2 and 500 cc normal saline bolus.  Patient states tonight while watching TV he started having heart palpitations all of a sudden.  States this has never happened to him before.  He denies history of A. fib or any heart rhythm problems.  No associated dizziness, dyspnea, or chest pain.  States he was in his usual state of health until this happened tonight.  Denies fevers, chills, cough, vomiting, abdominal pain, diarrhea, or dysuria.  Denies history of blood clots or calf pain/swelling.  He is not vaccinated against COVID.  ED Course: Afebrile.  Found to be in A. fib with RVR.  Not hypotensive.  Not hypoxic.  WBC 7.9, hemoglobin 15.5, platelet count 240K.  Sodium 142, potassium 3.5, chloride 107, bicarb 25, BUN 16, creatinine 1.0, glucose 137.  TSH normal.  Magnesium level normal.  High-sensitivity troponin mildly elevated at 30.  Screening SARS-CoV-2 PCR test pending.  UA without signs of infection.  Chest x-ray showing no acute intrathoracic process. Started on Cardizem drip and Eliquis.  He was also given 1 L normal saline bolus.  Review of Systems:  All systems reviewed and apart from history of presenting illness, are negative.  Past Medical History:  Diagnosis Date  . Hypertension   . Parkinson disease [facility])     Past Surgical History:  Procedure Laterality Date  . HERNIA REPAIR       reports that he has never smoked. He has quit using smokeless tobacco.  His smokeless tobacco use included chew. He reports that he does not drink alcohol and does not use drugs.  No Known  Allergies  Family History  Problem Relation Age of Onset  . Heart attack Father     Prior to Admission medications   Medication Sig Start Date End Date Taking? Authorizing Provider  aspirin EC 81 MG tablet Take 81 mg by mouth daily as needed for mild pain. Swallow whole.   Yes [provider]  carbidopa-levodopa (SINEMET IR) 25-100 MG tablet Take 1 tablet by mouth 6 (six) times daily. 10/07/19  Yes Huston Foley, MD  pramipexole (MIRAPEX) 1 MG tablet Take 1 tablet (1 mg total) by mouth 3 (three) times daily. 10/07/19  Yes Huston Foley, MD  rasagiline (AZILECT) 1 MG TABS tablet Take 1 tablet (1 mg total) by mouth daily. 10/07/19  Yes Huston Foley, MD    Physical Exam: Vitals:   06/13/20 0000 06/13/20 0015 06/13/20 0030 06/13/20 0045  BP: (!) 158/144 130/75 123/89 107/78  Pulse: 68 (!) 57 79 85  Resp: (!) 38 (!) 22 (!) 25 19  Temp:      TempSrc:      SpO2: 98% 98% 99% 98%    Physical Exam Constitutional:      General: He is not in acute distress. HENT:     Head: Normocephalic and atraumatic.  Eyes:     Extraocular Movements: Extraocular movements intact.     Conjunctiva/sclera: Conjunctivae normal.  Cardiovascular:     Rate and Rhythm: Tachycardia present. Rhythm irregular.     Pulses: Normal pulses.  Pulmonary:  Effort: Pulmonary effort is normal. No respiratory distress.     Breath sounds: Normal breath sounds. No wheezing or rales.     Comments: Satting 100% on room air Abdominal:     General: Bowel sounds are normal. There is no distension.     Palpations: Abdomen is soft.     Tenderness: There is no abdominal tenderness.  Musculoskeletal:        General: No swelling or tenderness.     Cervical back: Normal range of motion and neck supple.     Comments: No swelling or tenderness of calves  Skin:    General: Skin is warm and dry.  Neurological:     General: No focal deficit present.     Mental Status: He is alert and oriented to person, place, and time.      Labs on Admission: I have personally reviewed following labs and imaging studies  CBC: Recent Labs  Lab 06/12/20 2239 06/12/20 2301  WBC 7.9  --   NEUTROABS 5.8  --   HGB 15.5 15.3  HCT 46.4 45.0  MCV 93.0  --   PLT 240  --    Basic Metabolic Panel: Recent Labs  Lab 06/12/20 2239 06/12/20 2301  NA 142 142  K 3.5 3.6  CL 107 105  CO2 25  --   GLUCOSE 137* 129*  BUN 16 20  CREATININE 1.09 1.00  CALCIUM 9.0  --   MG 1.9  --    GFR: CrCl cannot be calculated (Unknown ideal weight.). Liver Function Tests: Recent Labs  Lab 06/12/20 2239  AST 19  ALT 8  ALKPHOS 72  BILITOT 0.6  PROT 6.5  ALBUMIN 3.9   No results for input(s): LIPASE, AMYLASE in the last 168 hours. No results for input(s): AMMONIA in the last 168 hours. Coagulation Profile: No results for input(s): INR, PROTIME in the last 168 hours. Cardiac Enzymes: No results for input(s): CKTOTAL, CKMB, CKMBINDEX, TROPONINI in the last 168 hours. BNP (last 3 results) No results for input(s): PROBNP in the last 8760 hours. HbA1C: No results for input(s): HGBA1C in the last 72 hours. CBG: No results for input(s): GLUCAP in the last 168 hours. Lipid Profile: No results for input(s): CHOL, HDL, LDLCALC, TRIG, CHOLHDL, LDLDIRECT in the last 72 hours. Thyroid Function Tests: Recent Labs    06/12/20 2235  TSH 2.197   Anemia Panel: No results for input(s): VITAMINB12, FOLATE, FERRITIN, TIBC, IRON, RETICCTPCT in the last 72 hours. Urine analysis:    Component Value Date/Time   COLORURINE STRAW (A) 06/12/2020 2241   APPEARANCEUR CLEAR 06/12/2020 2241   LABSPEC 1.010 06/12/2020 2241   PHURINE 7.0 06/12/2020 2241   GLUCOSEU NEGATIVE 06/12/2020 2241   HGBUR NEGATIVE 06/12/2020 2241   BILIRUBINUR NEGATIVE 06/12/2020 2241   KETONESUR NEGATIVE 06/12/2020 2241   PROTEINUR NEGATIVE 06/12/2020 2241   NITRITE NEGATIVE 06/12/2020 2241   LEUKOCYTESUR NEGATIVE 06/12/2020 2241    Radiological Exams on  Admission: DG Chest Portable 1 View  Result Date: 06/12/2020 CLINICAL DATA:  Atrial fibrillation EXAM: PORTABLE CHEST 1 VIEW COMPARISON:  04/20/2006 FINDINGS: Single frontal view of the chest demonstrates an unremarkable cardiac silhouette. No airspace disease, effusion, or pneumothorax. No acute bony abnormalities. IMPRESSION: 1. No acute intrathoracic process. Electronically Signed   By: Sharlet Salina M.D.   On: 06/12/2020 22:50    EKG: Independently reviewed.  A. fib with RVR, no prior tracing for comparison.  Assessment/Plan Principal Problem:   Atrial fibrillation with rapid ventricular  response (HCC) Active Problems:   Parkinson's disease (HCC)   Essential hypertension   Elevated troponin   New onset A. fib with RVR: TSH normal.  Potassium and magnesium levels within normal range.  No infectious signs or symptoms.  However, he is unvaccinated and COVID infection needs to be ruled out.  PE less likely to be a precipitating factor given no hypoxia or clinical signs of DVT.  CHA2DS2VASc at least 2.  Currently in A. fib with rate ranging between 90-110 at rest.  Blood pressure stable. -Cardiac monitoring.  Continue Cardizem infusion and Eliquis.  Echocardiogram ordered.  Check D-dimer level, if elevated, CT angiogram to rule out PE.  SARS-CoV-2 PCR test pending, continue airborne and contact precautions.  Please consult cardiology in the morning.  Mild troponin elevation: Likely due to demand demand ischemia from A. fib with RVR.  High-sensitivity troponin mildly elevated at 30.  ACS less likely as patient is not endorsing chest pain and EKG without acute ischemic changes. -Cardiac monitoring, trend troponin  Hypertension: Stable.  Not on any antihypertensives at home. -Cardizem infusion as mentioned above for A. fib, continue monitor blood pressure closely  Parkinson's disease: Stable. -Continue home Sinemet, Mirapex, and Azilect.  Outpatient neurology follow-up.  DVT prophylaxis:  Eliquis Code Status: Patient wishes to be full code. Family Communication: Wife at bedside. Disposition Plan: Status is: Observation  The patient remains OBS appropriate and will d/c before 2 midnights.  Dispo: The patient is from: Home              Anticipated d/c is to: Home              Anticipated d/c date is: 2 days              Patient currently is not medically stable to d/c.   Difficult to place patient No  The medical decision making on this patient was of high complexity and the patient is at high risk for clinical deterioration, therefore this is a level 3 visit.  John Giovanni MD [facility]  If 7PM-7AM, please contact night-coverage [email or web address]  06/13/2020, 1:13 AM

## 2020-06-13 NOTE — Progress Notes (Signed)
Converted to SB after pause. HR = 53. BP = 99/64. Asymptomatic. Dr Gerri Lins notified. EKG ordered.

## 2020-06-13 NOTE — Plan of Care (Signed)
  Problem: Education: Goal: Knowledge of disease or condition will improve Outcome: Progressing Goal: Understanding of medication regimen will improve Outcome: Progressing   Problem: Activity: Goal: Ability to tolerate increased activity will improve Outcome: Progressing   Problem: Cardiac: Goal: Ability to achieve and maintain adequate cardiopulmonary perfusion will improve Outcome: Progressing   

## 2020-06-13 NOTE — Progress Notes (Signed)
*  PRELIMINARY RESULTS* Echocardiogram 2D Echocardiogram has been performed.  Jack Cobb 06/13/2020, 12:19 PM

## 2020-06-13 NOTE — Progress Notes (Addendum)
PROGRESS NOTE  Jack Cobb:[id number] DOB: 1950-09-23 DOA: 06/12/2020 PCP: Aida Puffer, MD  Brief History   Jack Cobb is a 70 y.o. male with medical history significant of hypertension, Parkinson's disease presenting to the ED via EMS for evaluation of palpitations.  In A. fib with EMS with heart rate between 140-180.  EMS gave 10 mg Cardizem x2 and 500 cc normal saline bolus.  Patient states tonight while watching TV he started having heart palpitations all of a sudden.  States this has never happened to him before.  He denies history of A. fib or any heart rhythm problems.  No associated dizziness, dyspnea, or chest pain.  States he was in his usual state of health until this happened tonight.  Denies fevers, chills, cough, vomiting, abdominal pain, diarrhea, or dysuria.  Denies history of blood clots or calf pain/swelling.  He is not vaccinated against COVID.  ED Course: Afebrile.  Found to be in A. fib with RVR.  Not hypotensive.  Not hypoxic.  WBC 7.9, hemoglobin 15.5, platelet count 240K.  Sodium 142, potassium 3.5, chloride 107, bicarb 25, BUN 16, creatinine 1.0, glucose 137.  TSH normal.  Magnesium level normal.  High-sensitivity troponin mildly elevated at 30.  Screening SARS-CoV-2 PCR test pending.  UA without signs of infection.  Chest x-ray showing no acute intrathoracic process. Started on Cardizem drip and Eliquis.  He was also given 1 L normal saline bolus.  [facility] were consulted to admit the patient for further evaluation and care. He was admitted to a telemetry bed. The patient was started on Eliquis and placed on a cardizem drip. Echocardiogram was ordered. It has demonstrated an EF of 60-65%. LV Fxn is normal. LV diastolic function could not be evaluated. RVSF is normal. Left atrial size is mildly dilated. There is mild dilatation of the aortic root.  The patient was started on a diltiazem drip. He was transitioned to oral cardizem later this morning. He  was started on Eliquis by cardiology.   Consultants  . None  Procedures  . None  Antibiotics   Anti-infectives (From admission, onward)   None    .  Subjective  The patient is resting comfortably sitting on the edge of the bed. While he is sitting there his heart rate increases to 124 from 104.   Objective   Vitals:  Vitals:   06/13/20 1034 06/13/20 1103  BP: 93/62 103/63  Pulse:    Resp: (!) 32 (!) 26  Temp:    SpO2:      Exam:  Constitutional:  . The patient is awake, alert, and oriented x 3. No acute distress. Respiratory:  . No increased work of breathing. . No wheezes, rales, or rhonchi . No tactile fremitus Cardiovascular:  . Irregular rate and rhythm - Irregularly irregular, tachycardic . No murmurs, ectopy, or gallups. . No lateral PMI. No thrills. Abdomen:  . Abdomen is soft, non-tender, non-distended . No hernias, masses, or organomegaly . Normoactive bowel sounds.  Musculoskeletal:  . No cyanosis, clubbing, or edema Skin:  . No rashes, lesions, ulcers . palpation of skin: no induration or nodules Neurologic:  . CN 2-12 intact . Sensation all 4 extremities intact Psychiatric:  . Mental status o Mood, affect appropriate o Orientation to person, place, time  . judgment and insight appear intact  I have personally reviewed the following:   Today's Data  . Vitals, troponin, D Dimer,   Imaging  . CXR  Cardiology Data  .  EKG . Echocardiogram  Scheduled Meds: . apixaban  5 mg Oral BID  . carbidopa-levodopa  1 tablet Oral 6 X Daily  . diltiazem  30 mg Oral Q6H  . pramipexole  1 mg Oral TID  . rasagiline  1 mg Oral Daily   Continuous Infusions: . diltiazem (CARDIZEM) infusion Stopped (06/13/20 1238)    Principal Problem:   Atrial fibrillation with rapid ventricular response (HCC) Active Problems:   Parkinson's disease (HCC)   Essential hypertension   Elevated troponin   LOS: 0 days  A & P   New onset A. fib with RVR: TSH  normal.  Potassium and magnesium levels within normal range.  No infectious signs or symptoms.  However, he is unvaccinated and COVID infection needs to be ruled out.  PE less likely to be a precipitating factor given no hypoxia or clinical signs of DVT.  CHA2DS2VASc at least 2.  The patient has been started on Eliquis by cardiology. He has been placed on a cardizem drip at 5 mg/hr. The patient is being monitored on telemetry. Currently the pateint remains in A. fib with rate ranging between 90-124 at rest. Blood pressure low normal, but stable. D-dimer is negative. The patient is being transitioned to oral cardizem. Will ambulate patient once he is off of the drip. He will be discharged to home if rate is controlled. Echocardiogram has demonstrated an EF of 60-65%, slightly dilated left atrium, and no valvular abnormalities. SARS-CoV-2 PCR test is negative. Cardiology has been consulted and has evaluated the patient..  Mild troponin elevation: Likely due to demand demand ischemia from A. fib with RVR.  High-sensitivity troponin mildly elevated at 30.  ACS less likely as patient is not endorsing chest pain and EKG without acute ischemic changes. Cardiac monitoring, trend troponin. Troponins are trending down. No chest pain.  Hypertension: Stable. Low normal.  Not on any antihypertensives at home. Cardizem infusion as mentioned above for A. fib, continue monitor blood pressure closely  Parkinson's disease: Stable. Continue home Sinemet, Mirapex, and Azilect.  Outpatient neurology follow-up.  I have seen and examined this patient myself. I have spent 34 minutes in his evaluation and care.  DVT prophylaxis: Eliquis Code Status: Patient wishes to be full code. Family Communication: Wife at bedside. Disposition Plan: Status is: Observation  The patient remains OBS appropriate and will d/c before 2 midnights.  Dispo: The patient is from: Home  Anticipated d/c is to: Home   Anticipated d/c date is: 2 days  Patient currently is not medically stable to d/c.              Difficult to place patient No   [name], DO [facility] Direct contact: see [email or web address]  7PM-7AM contact night coverage as above 06/13/2020, 1:51 PM  LOS: 0 days

## 2020-06-13 NOTE — ED Notes (Signed)
Date and time results received: 06/13/20 0256  Test: Troponin Critical Value: 23  Name of Provider Notified: MD Rathore  Orders Received? Or Actions Taken?: Awaiting orders

## 2020-06-13 NOTE — Discharge Instructions (Signed)
 Atrial Fibrillation  Atrial fibrillation is a type of heartbeat that is irregular or fast. If you have this condition, your heart beats without any order. This makes it hard for your heart to pump blood in a normal way. Atrial fibrillation may come and go, or it may become a long-lasting problem. If this condition is not treated, it can put you at higher risk for stroke, heart failure, and other heart problems. What are the causes? This condition may be caused by diseases that damage the heart. They include:  High blood pressure.  Heart failure.  Heart valve disease.  Heart surgery. Other causes include:  Diabetes.  Thyroid disease.  Being overweight.  Kidney disease. Sometimes the cause is not known. What increases the risk? You are more likely to develop this condition if:  You are older.  You smoke.  You exercise often and very hard.  You have a family history of this condition.  You are a man.  You use drugs.  You drink a lot of alcohol.  You have lung conditions, such as emphysema, pneumonia, or COPD.  You have sleep apnea. What are the signs or symptoms? Common symptoms of this condition include:  A feeling that your heart is beating very fast.  Chest pain or discomfort.  Feeling short of breath.  Suddenly feeling light-headed or weak.  Getting tired easily during activity.  Fainting.  Sweating. In some cases, there are no symptoms. How is this treated? Treatment for this condition depends on underlying conditions and how you feel when you have atrial fibrillation. They include:  Medicines to: ? Prevent blood clots. ? Treat heart rate or heart rhythm problems.  Using devices, such as a pacemaker, to correct heart rhythm problems.  Doing surgery to remove the part of the heart that sends bad signals.  Closing an area where clots can form in the heart (left atrial appendage). In some cases, your doctor will treat other underlying  conditions. Follow these instructions at home: Medicines  Take over-the-counter and prescription medicines only as told by your doctor.  Do not take any new medicines without first talking to your doctor.  If you are taking blood thinners: ? Talk with your doctor before you take any medicines that have aspirin or NSAIDs, such as ibuprofen, in them. ? Take your medicine exactly as told by your doctor. Take it at the same time each day. ? Avoid activities that could hurt or bruise you. Follow instructions about how to prevent falls. ? Wear a bracelet that says you are taking blood thinners. Or, carry a card that lists what medicines you take. Lifestyle  Do not use any products that have nicotine or tobacco in them. These include cigarettes, e-cigarettes, and chewing tobacco. If you need help quitting, ask your doctor.  Eat heart-healthy foods. Talk with your doctor about the right eating plan for you.  Exercise regularly as told by your doctor.  Do not drink alcohol.  Lose weight if you are overweight.  Do not use drugs, including cannabis.      General instructions  If you have a condition that causes breathing to stop for a short period of time (apnea), treat it as told by your doctor.  Keep a healthy weight. Do not use diet pills unless your doctor says they are safe for you. Diet pills may make heart problems worse.  Keep all follow-up visits as told by your doctor. This is important. Contact a doctor if:  You notice   a change in the speed, rhythm, or strength of your heartbeat.  You are taking a blood-thinning medicine and you get more bruising.  You get tired more easily when you move or exercise.  You have a sudden change in weight. Get help right away if:  You have pain in your chest or your belly (abdomen).  You have trouble breathing.  You have side effects of blood thinners, such as blood in your vomit, poop (stool), or pee (urine), or bleeding that cannot  stop.  You have any signs of a stroke. "BE FAST" is an easy way to remember the main warning signs: ? B - Balance. Signs are dizziness, sudden trouble walking, or loss of balance. ? E - Eyes. Signs are trouble seeing or a change in how you see. ? F - Face. Signs are sudden weakness or loss of feeling in the face, or the face or eyelid drooping on one side. ? A - Arms. Signs are weakness or loss of feeling in an arm. This happens suddenly and usually on one side of the body. ? S - Speech. Signs are sudden trouble speaking, slurred speech, or trouble understanding what people say. ? T - Time. Time to call emergency services. Write down what time symptoms started.  You have other signs of a stroke, such as: ? A sudden, very bad headache with no known cause. ? Feeling like you may vomit (nausea). ? Vomiting. ? A seizure. These symptoms may be an emergency. Do not wait to see if the symptoms will go away. Get medical help right away. Call your local emergency services (911 in the [place] Do not drive yourself to the hospital.   Summary  Atrial fibrillation is a type of heartbeat that is irregular or fast.  You are at higher risk of this condition if you smoke, are older, have diabetes, or are overweight.  Follow your doctor's instructions about medicines, diet, exercise, and follow-up visits.  Get help right away if you have signs or symptoms of a stroke.  Get help right away if you cannot catch your breath, or you have chest pain or discomfort. This information is not intended to replace advice given to you by your health care provider. Make sure you discuss any questions you have with your health care provider. Document Revised: 10/30/2018 Document Reviewed: 10/30/2018 Elsevier Patient Education  2021 [employer] Information on my medicine - ELIQUIS (apixaban)  This medication education was reviewed with me or my healthcare representative as part of my discharge preparation.   Why  was Eliquis prescribed for you? Eliquis was prescribed for you to reduce the risk of a blood clot forming that can cause a stroke if you have a medical condition called atrial fibrillation (a type of irregular heartbeat).  What do You need to know about Eliquis ? Take your Eliquis TWICE DAILY - one tablet in the morning and one tablet in the evening with or without food. If you have difficulty swallowing the tablet whole please discuss with your pharmacist how to take the medication safely.  Take Eliquis exactly as prescribed by your doctor and DO NOT stop taking Eliquis without talking to the doctor who prescribed the medication.  Stopping may increase your risk of developing a stroke.  Refill your prescription before you run out.  After discharge, you should have regular check-up appointments with your healthcare provider that is prescribing your Eliquis.  In the future your dose may need to be changed if your kidney   function or weight changes by a significant amount or as you get older.  What do you do if you miss a dose? If you miss a dose, take it as soon as you remember on the same day and resume taking twice daily.  Do not take more than one dose of ELIQUIS at the same time to make up a missed dose.  Important Safety Information A possible side effect of Eliquis is bleeding. You should call your healthcare provider right away if you experience any of the following: ? Bleeding from an injury or your nose that does not stop. ? Unusual colored urine (red or dark brown) or unusual colored stools (red or black). ? Unusual bruising for unknown reasons. ? A serious fall or if you hit your head (even if there is no bleeding).  Some medicines may interact with Eliquis and might increase your risk of bleeding or clotting while on Eliquis. To help avoid this, consult your healthcare provider or pharmacist prior to using any new prescription or non-prescription medications, including  herbals, vitamins, non-steroidal anti-inflammatory drugs (NSAIDs) and supplements.  This website has more information on Eliquis (apixaban): http://[email or web address].com/eliquis/home  

## 2020-06-14 ENCOUNTER — Other Ambulatory Visit: Payer: Self-pay | Admitting: Physician Assistant

## 2020-06-14 ENCOUNTER — Encounter (HOSPITAL_COMMUNITY): Payer: Self-pay | Admitting: Internal Medicine

## 2020-06-14 DIAGNOSIS — R778 Other specified abnormalities of plasma proteins: Secondary | ICD-10-CM

## 2020-06-14 DIAGNOSIS — I10 Essential (primary) hypertension: Secondary | ICD-10-CM | POA: Diagnosis not present

## 2020-06-14 DIAGNOSIS — I4891 Unspecified atrial fibrillation: Secondary | ICD-10-CM | POA: Diagnosis not present

## 2020-06-14 DIAGNOSIS — R079 Chest pain, unspecified: Secondary | ICD-10-CM

## 2020-06-14 MED ORDER — DILTIAZEM HCL ER COATED BEADS 180 MG PO CP24
180.0000 mg | ORAL_CAPSULE | Freq: Once | ORAL | Status: AC
  Administered 2020-06-14: 180 mg via ORAL
  Filled 2020-06-14: qty 1

## 2020-06-14 MED ORDER — DILTIAZEM HCL ER COATED BEADS 180 MG PO CP24: 180.0000 mg | ORAL_CAPSULE | Freq: Every day | ORAL | 0 refills | Status: DC

## 2020-06-14 MED ORDER — APIXABAN 5 MG PO TABS: 5.0000 mg | ORAL_TABLET | Freq: Two times a day (BID) | ORAL | 0 refills | Status: DC

## 2020-06-14 NOTE — Consult Note (Addendum)
Cardiology Consultation:   Patient ID: Jack Cobb; [id number]; 10/03/1950   Admit date: 06/12/2020 Date of Consult: 06/14/2020  Primary Care Provider: Aida Cobb, James, MD Primary Cardiologist: No primary care provider on file. new Primary Electrophysiologist:  None   Patient Profile:   Jack Cobb is a 70 y.o. male with a hx of HTN, Parkinson's dz, who is being seen today for the evaluation of atrial fib at the request of Dr Jack Cobb.  History of Present Illness:   Jack Cobb has no history of cardiac issues. He went to the ER for palpitations and was in rapid atrial fib. HR improved with Cardizem IV >> po. Cards asked to see.   Mr Jack Cobb was in his USOH till Saturday night about 9 pm. He was watching TV with his wife, no ETOH or excess caffeine.  He had sudden onset of palpitations, HR 153 on home BP machine. He felt hyper, but no chest pain or SOB.  Not light-headed or dizzy.  Came to the ER and HR improved with Cardizem. Once HR improved, he was asymptomatic. He spontaneously converted to SR.   Once in SR, he did not feel any different.   He has never had heart racing like that before.   He stays busy around the house and yard, never gets chest pain. He will get SOB with exertion when the Levodopa wears off.   Past Medical History:  Diagnosis Date  . Hypertension   . Parkinson disease [facility](HCC)     Past Surgical History:  Procedure Laterality Date  . HERNIA REPAIR       Prior to Admission medications   Medication Sig Start Date End Date Taking? Authorizing Provider  aspirin EC 81 MG tablet Take 81 mg by mouth daily as needed for mild pain. Swallow whole.   Yes [provider]  carbidopa-levodopa (SINEMET IR) 25-100 MG tablet Take 1 tablet by mouth 6 (six) times daily. 10/07/19  Yes Jack FoleyAthar, Saima, MD  pramipexole (MIRAPEX) 1 MG tablet Take 1 tablet (1 mg total) by mouth 3 (three) times daily. 10/07/19  Yes Jack FoleyAthar, Saima, MD  rasagiline (AZILECT) 1 MG TABS  tablet Take 1 tablet (1 mg total) by mouth daily. 10/07/19  Yes Jack FoleyAthar, Saima, MD    Inpatient Medications: Scheduled Meds: . apixaban  5 mg Oral BID  . carbidopa-levodopa  1 tablet Oral 6 X Daily  . diltiazem  30 mg Oral Q6H  . pramipexole  1 mg Oral TID  . rasagiline  1 mg Oral Daily   Continuous Infusions: . diltiazem (CARDIZEM) infusion Stopped (06/13/20 1238)   PRN Meds: acetaminophen  Allergies:   No Known Allergies  Social History:   Social History   Socioeconomic History  . Marital status: Married    Spouse name: Not on file  . Number of children: 3  . Years of education: 12th  . Highest education level: Not on file  Occupational History  . Occupation: N/A    Comment: retired  Tobacco Use  . Smoking status: Never Smoker  . Smokeless tobacco: Former NeurosurgeonUser    Types: Chew  . Tobacco comment: quit 8 years ago  Substance and Sexual Activity  . Alcohol use: No  . Drug use: No  . Sexual activity: Not on file  Other Topics Concern  . Not on file  Social History Narrative   Patient is right handed, resides alone, mother resides with him 1/2 the time,has dementia   Social Determinants of Health   Financial  Resource Strain: Not on file  Food Insecurity: Not on file  Transportation Needs: Not on file  Physical Activity: Not on file  Stress: Not on file  Social Connections: Not on file  Intimate Partner Violence: Not on file    Family History:   Family History  Problem Relation Age of Onset  . Heart attack Father    Family Status:  Family Status  Relation Name Status  . Mother  Alive  . Father  Deceased at age 69    ROS:  Please see the history of present illness.  All other ROS reviewed and negative.     Physical Exam/Data:   Vitals:   06/14/20 0511 06/14/20 0846 06/14/20 1214 06/14/20 1229  BP: (!) 149/86 116/77 127/79 127/79  Pulse: 78 69 62   Resp: 20 (!) 21 20   Temp: 98 F (36.7 C) 97.8 F (36.6 C) 97.8 F (36.6 C)   TempSrc: Oral Oral  Oral   SpO2: 96% 95% 96%   Weight: 70.9 kg     Height:        Intake/Output Summary (Last 24 hours) at 06/14/2020 1326 Last data filed at 06/14/2020 0700 Gross per 24 hour  Intake 462 ml  Output 650 ml  Net -188 ml    Last 3 Weights 06/14/2020 06/13/2020 05/04/2020  Weight (lbs) 156 lb 4.9 oz 155 lb 160 lb  Weight (kg) 70.9 kg 70.308 kg 72.576 kg     Body mass index is 22.11 kg/m.   General:  Well nourished, well developed, male in no acute distress HEENT: normal Lymph: no adenopathy Neck: JVD - not elevated Endocrine:  No thryomegaly Vascular: No carotid bruits; 4/4 extremity pulses 2+  Cardiac:  normal S1, S2; RRR; no murmur Lungs:  clear bilaterally, no wheezing, rhonchi or rales  Abd: soft, nontender, no hepatomegaly  Ext: no edema Musculoskeletal:  No deformities, BUE and BLE strength normal and equal Skin: warm and dry  Neuro:  CNs 2-12 intact, no focal abnormalities noted Psych:  Normal affect   EKG:  The EKG was personally reviewed and demonstrates:  01/22 ECG is atrial fib, RVR, HR 132 Telemetry:  Telemetry was personally reviewed and demonstrates:  Atrial fib RVR >> controlled VR >> SR   CV studies:   ECHO: 06/13/2020 1. Left ventricular ejection fraction, by estimation, is 60 to 65%. The  left ventricle has normal function. The left ventricle has no regional  wall motion abnormalities. Left ventricular diastolic function could not  be evaluated.  2. Right ventricular systolic function is normal. The right ventricular  size is normal. Tricuspid regurgitation signal is inadequate for assessing  PA pressure.  3. Left atrial size was mildly dilated.  4. The mitral valve is normal in structure. No evidence of mitral valve  regurgitation. No evidence of mitral stenosis.  5. The aortic valve is tricuspid. Aortic valve regurgitation is not  visualized. Mild aortic valve sclerosis is present, with no evidence of  aortic valve stenosis.  6. Aortic dilatation  noted. There is mild dilatation of the aortic root,  measuring 38 mm.  7. The inferior vena cava is normal in size with greater than 50%  respiratory variability, suggesting right atrial pressure of 3 mmHg.    Laboratory Data:   Chemistry Recent Labs  Lab 06/12/20 2239 06/12/20 2301  NA 142 142  K 3.5 3.6  CL 107 105  CO2 25  --   GLUCOSE 137* 129*  BUN 16 20  CREATININE 1.09  1.00  CALCIUM 9.0  --   GFRNONAA >60  --   ANIONGAP 10  --     Lab Results  Component Value Date   ALT 8 06/12/2020   AST 19 06/12/2020   ALKPHOS 72 06/12/2020   BILITOT 0.6 06/12/2020   Hematology Recent Labs  Lab 06/12/20 2239 06/12/20 2301  WBC 7.9  --   RBC 4.99  --   HGB 15.5 15.3  HCT 46.4 45.0  MCV 93.0  --   MCH 31.1  --   MCHC 33.4  --   RDW 13.0  --   PLT 240  --    Cardiac Enzymes High Sensitivity Troponin:   Recent Labs  Lab 06/12/20 2239 06/13/20 0110 06/13/20 0455  TROPONINIHS 30* 65* 54*      BNPNo results for input(s): BNP, PROBNP in the last 168 hours.  DDimer  Recent Labs  Lab 06/13/20 0127  DDIMER <0.27   TSH:  Lab Results  Component Value Date   TSH 2.197 06/12/2020   Lipids:No results found for: CHOL, HDL, LDLCALC, LDLDIRECT, TRIG, CHOLHDL HgbA1c:No results found for: HGBA1C Magnesium:  Magnesium  Date Value Ref Range Status  06/12/2020 1.9 1.7 - 2.4 mg/dL Final    Comment:    Performed at [facility], [address], [place], [place] [address]     Radiology/Studies:  DG Chest Portable 1 View  Result Date: 06/12/2020 CLINICAL DATA:  Atrial fibrillation EXAM: PORTABLE CHEST 1 VIEW COMPARISON:  04/20/2006 FINDINGS: Single frontal view of the chest demonstrates an unremarkable cardiac silhouette. No airspace disease, effusion, or pneumothorax. No acute bony abnormalities. IMPRESSION: 1. No acute intrathoracic process. Electronically Signed   By: Sharlet Salina M.D.   On: 06/12/2020 22:50   ECHOCARDIOGRAM COMPLETE  Result Date:  06/13/2020    ECHOCARDIOGRAM REPORT   Patient Name:   Jack Cobb Date of Exam: 06/13/2020 Medical Rec #:  [id number]        Height:       70.5 in Accession #:    [id number]       Weight:       155.0 lb Date of Birth:  1950/07/10         BSA:          1.883 m Patient Age:    69 years         BP:           103/63 mmHg Patient Gender: M                HR:           86 bpm. Exam Location:  Inpatient Procedure: 2D Echo, Cardiac Doppler and Color Doppler Indications:    Atrial Fibrillation I48.91  History:        Patient has no prior history of Echocardiogram examinations.                 Elevated troponin; Risk Factors:Hypertension. Parkinson disease                 (HCC) (From Hx).  Sonographer:    Celesta Gentile RCS Referring Phys: [id number] VASUNDHRA RATHORE IMPRESSIONS  1. Left ventricular ejection fraction, by estimation, is 60 to 65%. The left ventricle has normal function. The left ventricle has no regional wall motion abnormalities. Left ventricular diastolic function could not be evaluated.  2. Right ventricular systolic function is normal. The right ventricular size is normal. Tricuspid regurgitation signal is inadequate for assessing  PA pressure.  3. Left atrial size was mildly dilated.  4. The mitral valve is normal in structure. No evidence of mitral valve regurgitation. No evidence of mitral stenosis.  5. The aortic valve is tricuspid. Aortic valve regurgitation is not visualized. Mild aortic valve sclerosis is present, with no evidence of aortic valve stenosis.  6. Aortic dilatation noted. There is mild dilatation of the aortic root, measuring 38 mm.  7. The inferior vena cava is normal in size with greater than 50% respiratory variability, suggesting right atrial pressure of 3 mmHg. FINDINGS  Left Ventricle: Left ventricular ejection fraction, by estimation, is 60 to 65%. The left ventricle has normal function. The left ventricle has no regional wall motion abnormalities. The left ventricular internal  cavity size was normal in size. There is  no left ventricular hypertrophy. Left ventricular diastolic function could not be evaluated due to atrial fibrillation. Left ventricular diastolic function could not be evaluated. Right Ventricle: The right ventricular size is normal. No increase in right ventricular wall thickness. Right ventricular systolic function is normal. Tricuspid regurgitation signal is inadequate for assessing PA pressure. Left Atrium: Left atrial size was mildly dilated. Right Atrium: Right atrial size was normal in size. Pericardium: There is no evidence of pericardial effusion. Mitral Valve: The mitral valve is normal in structure. No evidence of mitral valve regurgitation. No evidence of mitral valve stenosis. Tricuspid Valve: The tricuspid valve is normal in structure. Tricuspid valve regurgitation is trivial. No evidence of tricuspid stenosis. Aortic Valve: The aortic valve is tricuspid. Aortic valve regurgitation is not visualized. Mild aortic valve sclerosis is present, with no evidence of aortic valve stenosis. Pulmonic Valve: The pulmonic valve was normal in structure. Pulmonic valve regurgitation is not visualized. No evidence of pulmonic stenosis. Aorta: Aortic dilatation noted. There is mild dilatation of the aortic root, measuring 38 mm. Venous: The inferior vena cava is normal in size with greater than 50% respiratory variability, suggesting right atrial pressure of 3 mmHg. IAS/Shunts: No atrial level shunt detected by color flow Doppler.  LEFT VENTRICLE PLAX 2D LVIDd:         4.50 cm LVIDs:         2.60 cm LV PW:         1.00 cm LV IVS:        1.00 cm LVOT diam:     2.00 cm LV SV:         44 LV SV Index:   24 LVOT Area:     3.14 cm  RIGHT VENTRICLE TAPSE (M-mode): 1.9 cm LEFT ATRIUM             Index       RIGHT ATRIUM           Index LA diam:        3.80 cm 2.02 cm/m  RA Area:     17.50 cm LA Vol (A2C):   58.8 ml 31.23 ml/m RA Volume:   46.90 ml  24.91 ml/m LA Vol (A4C):    72.6 ml 38.56 ml/m LA Biplane Vol: 68.8 ml 36.54 ml/m  AORTIC VALVE LVOT Vmax:   79.00 cm/s LVOT Vmean:  58.500 cm/s LVOT VTI:    0.141 m  AORTA Ao Root diam: 3.80 cm MITRAL VALVE MV Area (PHT): 3.60 cm    SHUNTS MV Decel Time: 211 msec    Systemic VTI:  0.14 m MV E velocity: 78.20 cm/s  Systemic Diam: 2.00 cm Armanda Magic MD Electronically signed by Gloris Manchester  Turner MD Signature Date/Time: 06/13/2020/12:28:12 PM    Final     Assessment and Plan:   1. Atrial fib, RVR - s/p spontaneous conversion to SR, was on Cardizem - when HR was controlled, pt not aware of the Afib -  Continue Cardizem, feel HR and BP will tolerate 180 mg qd - CHA2DS2-VASc = 2 (age x 1, HTN), has been started on Eliquis 5 mg bid - EF normal on echo w/ only mild LA dilatation - MD advise if any further workup indicated, otherwise, ok to d/c and f/u as outpt, possibly after a monitor.  2. Elevated troponin - occurred in the setting of elevated HR - no hx ischemic sx - MD advise on further eval.  Otherwise, per IM Principal Problem:   Atrial fibrillation with rapid ventricular response (HCC) Active Problems:   Parkinson's disease (HCC)   Essential hypertension   Elevated troponin     For questions or updates, please contact [facility] Please consult [email or web address] for contact info under Cardiology/STEMI.   Melida Quitter, PA-C  06/14/2020 1:26 PM

## 2020-06-14 NOTE — Progress Notes (Signed)
Patient received 60 mg of Cardizem PO Dr Gerri Lins made aware

## 2020-06-14 NOTE — Discharge Summary (Signed)
Physician Discharge Summary  Jack Cobb NWG:[id number] DOB: July 28, 1950 DOA: 06/12/2020  PCP: Aida Puffer, MD  Admit date: 06/12/2020 Discharge date: 06/14/2020  Recommendations for Outpatient Follow-up:  1. Discharge to home 2. Follow up with PCP in 7-10 days 3. Follow up with atrial fibrillation clinic as directed.  Discharge Diagnoses: Principal diagnosis is #1 1. Atrial fibrillation with RVR 2. Parkinsons 3. Hypertension  Discharge Condition: Fair  Disposition: Discharge to home  Diet recommendation: Heart healthy  Filed Weights   06/13/20 0559 06/14/20 0511  Weight: 70.3 kg 70.9 kg   History of present illness: Jack Cobb has no history of cardiac issues. He went to the ER for palpitations and was in rapid atrial fib. HR improved with Cardizem IV >> po. Cards asked to see.   Jack Cobb was in his USOH till Saturday night about 9 pm. He was watching TV with his wife, no ETOH or excess caffeine.  He had sudden onset of palpitations, HR 153 on home BP machine. He felt hyper, but no chest pain or SOB.  Not light-headed or dizzy.  Came to the ER and HR improved with Cardizem. Once HR improved, he was asymptomatic. He spontaneously converted to SR.   Once in SR, he did not feel any different.   He has never had heart racing like that before.   He stays busy around the house and yard, never gets chest pain. He will get SOB with exertion when the Levodopa wears off.   Hospital Course:  The patient was placed on a cardizem drip and eliquis. He converted to sinus on the evening of 06/13/2020.  Cardiology was consulted and they have evaluated the patient. They have recommended increasing cardizem to 180/day. He will be discharged to home and follow up with atrial fibrillation clinic as directed.  Today's assessment: S: The patient is sitting up at bedside. No new complaints. O: Vitals:  Vitals:   06/14/20 1400 06/14/20 1648  BP: 121/76 129/76  Pulse: 82 77   Resp: 20 20  Temp: 98 F (36.7 C) 98 F (36.7 C)  SpO2:      Exam:  Constitutional:  . The patient is awake, alert, and oriented x 3. No acute distress. Respiratory:  . No increased work of breathing. . No wheezes, rales, or rhonchi . No tactile fremitus Cardiovascular:  . Regular rate and rhythm . No murmurs, ectopy, or gallups. . No lateral PMI. No thrills. Abdomen:  . Abdomen is soft, non-tender, non-distended . No hernias, masses, or organomegaly . Normoactive bowel sounds.  Musculoskeletal:  . No cyanosis, clubbing, or edema Skin:  . No rashes, lesions, ulcers . palpation of skin: no induration or nodules Neurologic:  . CN 2-12 intact . Sensation all 4 extremities intact Psychiatric:  . Mental status o Mood, affect appropriate o Orientation to person, place, time  . judgment and insight appear intact  Discharge Instructions  Discharge Instructions    Activity as tolerated - No restrictions   Complete by: As directed    Amb referral to AFIB Clinic   Complete by: As directed    Call MD for:  difficulty breathing, headache or visual disturbances   Complete by: As directed    Call MD for:  extreme fatigue   Complete by: As directed    Call MD for:  persistant dizziness or light-headedness   Complete by: As directed    Diet - low sodium heart healthy   Complete by: As directed    Discharge  instructions   Complete by: As directed    Discharge to home Follow up with PCP in 7-10 days Follow up with atrial fibrillation clinic as directed.   Increase activity slowly   Complete by: As directed      Allergies as of 06/14/2020   No Known Allergies     Medication List    TAKE these medications   apixaban 5 MG Tabs tablet Commonly known as: ELIQUIS Take 1 tablet (5 mg total) by mouth 2 (two) times daily.   aspirin EC 81 MG tablet Take 81 mg by mouth daily as needed for mild pain. Swallow whole.   carbidopa-levodopa 25-100 MG tablet Commonly known as:  SINEMET IR Take 1 tablet by mouth 6 (six) times daily.   diltiazem 180 MG 24 hr capsule Commonly known as: Cardizem CD Take 1 capsule (180 mg total) by mouth daily.   pramipexole 1 MG tablet Commonly known as: MIRAPEX Take 1 tablet (1 mg total) by mouth 3 (three) times daily.   rasagiline 1 MG Tabs tablet Commonly known as: AZILECT Take 1 tablet (1 mg total) by mouth daily.      No Known Allergies  The results of significant diagnostics from this hospitalization (including imaging, microbiology, ancillary and laboratory) are listed below for reference.    Significant Diagnostic Studies: DG Chest Portable 1 View  Result Date: 06/12/2020 CLINICAL DATA:  Atrial fibrillation EXAM: PORTABLE CHEST 1 VIEW COMPARISON:  04/20/2006 FINDINGS: Single frontal view of the chest demonstrates an unremarkable cardiac silhouette. No airspace disease, effusion, or pneumothorax. No acute bony abnormalities. IMPRESSION: 1. No acute intrathoracic process. Electronically Signed   By: Sharlet Salina M.D.   On: 06/12/2020 22:50   ECHOCARDIOGRAM COMPLETE  Result Date: 06/13/2020    ECHOCARDIOGRAM REPORT   Patient Name:   Jack Cobb Date of Exam: 06/13/2020 Medical Rec #:  [id number]        Height:       70.5 in Accession #:    [id number]       Weight:       155.0 lb Date of Birth:  07/03/1950         BSA:          1.883 m Patient Age:    69 years         BP:           103/63 mmHg Patient Gender: M                HR:           86 bpm. Exam Location:  Inpatient Procedure: 2D Echo, Cardiac Doppler and Color Doppler Indications:    Atrial Fibrillation I48.91  History:        Patient has no prior history of Echocardiogram examinations.                 Elevated troponin; Risk Factors:Hypertension. Parkinson disease                 (HCC) (From Hx).  Sonographer:    Celesta Gentile RCS Referring Phys: [id number] VASUNDHRA RATHORE IMPRESSIONS  1. Left ventricular ejection fraction, by estimation, is 60 to 65%. The left  ventricle has normal function. The left ventricle has no regional wall motion abnormalities. Left ventricular diastolic function could not be evaluated.  2. Right ventricular systolic function is normal. The right ventricular size is normal. Tricuspid regurgitation signal is inadequate for assessing PA pressure.  3. Left atrial size was mildly  dilated.  4. The mitral valve is normal in structure. No evidence of mitral valve regurgitation. No evidence of mitral stenosis.  5. The aortic valve is tricuspid. Aortic valve regurgitation is not visualized. Mild aortic valve sclerosis is present, with no evidence of aortic valve stenosis.  6. Aortic dilatation noted. There is mild dilatation of the aortic root, measuring 38 mm.  7. The inferior vena cava is normal in size with greater than 50% respiratory variability, suggesting right atrial pressure of 3 mmHg. FINDINGS  Left Ventricle: Left ventricular ejection fraction, by estimation, is 60 to 65%. The left ventricle has normal function. The left ventricle has no regional wall motion abnormalities. The left ventricular internal cavity size was normal in size. There is  no left ventricular hypertrophy. Left ventricular diastolic function could not be evaluated due to atrial fibrillation. Left ventricular diastolic function could not be evaluated. Right Ventricle: The right ventricular size is normal. No increase in right ventricular wall thickness. Right ventricular systolic function is normal. Tricuspid regurgitation signal is inadequate for assessing PA pressure. Left Atrium: Left atrial size was mildly dilated. Right Atrium: Right atrial size was normal in size. Pericardium: There is no evidence of pericardial effusion. Mitral Valve: The mitral valve is normal in structure. No evidence of mitral valve regurgitation. No evidence of mitral valve stenosis. Tricuspid Valve: The tricuspid valve is normal in structure. Tricuspid valve regurgitation is trivial. No evidence of  tricuspid stenosis. Aortic Valve: The aortic valve is tricuspid. Aortic valve regurgitation is not visualized. Mild aortic valve sclerosis is present, with no evidence of aortic valve stenosis. Pulmonic Valve: The pulmonic valve was normal in structure. Pulmonic valve regurgitation is not visualized. No evidence of pulmonic stenosis. Aorta: Aortic dilatation noted. There is mild dilatation of the aortic root, measuring 38 mm. Venous: The inferior vena cava is normal in size with greater than 50% respiratory variability, suggesting right atrial pressure of 3 mmHg. IAS/Shunts: No atrial level shunt detected by color flow Doppler.  LEFT VENTRICLE PLAX 2D LVIDd:         4.50 cm LVIDs:         2.60 cm LV PW:         1.00 cm LV IVS:        1.00 cm LVOT diam:     2.00 cm LV SV:         44 LV SV Index:   24 LVOT Area:     3.14 cm  RIGHT VENTRICLE TAPSE (M-mode): 1.9 cm LEFT ATRIUM             Index       RIGHT ATRIUM           Index LA diam:        3.80 cm 2.02 cm/m  RA Area:     17.50 cm LA Vol (A2C):   58.8 ml 31.23 ml/m RA Volume:   46.90 ml  24.91 ml/m LA Vol (A4C):   72.6 ml 38.56 ml/m LA Biplane Vol: 68.8 ml 36.54 ml/m  AORTIC VALVE LVOT Vmax:   79.00 cm/s LVOT Vmean:  58.500 cm/s LVOT VTI:    0.141 m  AORTA Ao Root diam: 3.80 cm MITRAL VALVE MV Area (PHT): 3.60 cm    SHUNTS MV Decel Time: 211 msec    Systemic VTI:  0.14 m MV E velocity: 78.20 cm/s  Systemic Diam: 2.00 cm Armanda Magicraci Turner MD Electronically signed by Armanda Magicraci Turner MD Signature Date/Time: 06/13/2020/12:28:12 PM    Final  Microbiology: Recent Results (from the past 240 hour(s))  SARS CORONAVIRUS 2 (TAT 6-24 HRS) Nasopharyngeal Nasopharyngeal Swab     Status: None   Collection Time: 06/12/20 10:38 PM   Specimen: Nasopharyngeal Swab  Result Value Ref Range Status   SARS Coronavirus 2 NEGATIVE NEGATIVE Final    Comment: (NOTE) SARS-CoV-2 target nucleic acids are NOT DETECTED.  The SARS-CoV-2 RNA is generally detectable in upper and  lower respiratory specimens during the acute phase of infection. Negative results do not preclude SARS-CoV-2 infection, do not rule out co-infections with other pathogens, and should not be used as the sole basis for treatment or other patient management decisions. Negative results must be combined with clinical observations, patient history, and epidemiological information. The expected result is Negative.  Fact Sheet for Patients: HairSlick.no  Fact Sheet for Healthcare Providers: quierodirigir.com  This test is not yet approved or cleared by the [place] FDA and  has been authorized for detection and/or diagnosis of SARS-CoV-2 by FDA under an Emergency Use Authorization (EUA). This EUA will remain  in effect (meaning this test can be used) for the duration of the COVID-19 declaration under Se ction 564(b)(1) of the Act, 21 U.S.C. section 360bbb-3(b)(1), unless the authorization is terminated or revoked sooner.  Performed at [facility], [address] [place], [place] [address]      Labs: Basic Metabolic Panel: Recent Labs  Lab 06/12/20 2239 06/12/20 2301  NA 142 142  K 3.5 3.6  CL 107 105  CO2 25  --   GLUCOSE 137* 129*  BUN 16 20  CREATININE 1.09 1.00  CALCIUM 9.0  --   MG 1.9  --    Liver Function Tests: Recent Labs  Lab 06/12/20 2239  AST 19  ALT 8  ALKPHOS 72  BILITOT 0.6  PROT 6.5  ALBUMIN 3.9   No results for input(s): LIPASE, AMYLASE in the last 168 hours. No results for input(s): AMMONIA in the last 168 hours. CBC: Recent Labs  Lab 06/12/20 2239 06/12/20 2301  WBC 7.9  --   NEUTROABS 5.8  --   HGB 15.5 15.3  HCT 46.4 45.0  MCV 93.0  --   PLT 240  --    Cardiac Enzymes: No results for input(s): CKTOTAL, CKMB, CKMBINDEX, TROPONINI in the last 168 hours. BNP: BNP (last 3 results) No results for input(s): BNP in the last 8760 hours.  ProBNP (last 3 results) No results  for input(s): PROBNP in the last 8760 hours.  CBG: No results for input(s): GLUCAP in the last 168 hours.  Principal Problem:   Atrial fibrillation with rapid ventricular response (HCC) Active Problems:   Parkinson's disease (HCC)   Essential hypertension   Elevated troponin   Time coordinating discharge: 38 minutes.  Signed:        Shaquisha Wynn, DO [facility]  06/14/2020, 5:12 PM

## 2020-06-14 NOTE — TOC Benefit Eligibility Note (Signed)
Transition of Care [facility]) Benefit Eligibility Note    Patient Details  Name: Jack Cobb MRN: [id number] Date of Birth: 08/04/1950   Medication/Dose: Arne Cleveland   5 MG BID  Covered?: Yes     Prescription Coverage Preferred Pharmacy: [facility]  Spoke with Person/Company/Phone Number:: JAN   @ [place] ZP # [phone]  Co-Pay: $47.00  Prior Approval: No  Deductible: Met       Memory Argue Phone Number: 06/14/2020, 11:47 AM

## 2020-06-22 ENCOUNTER — Telehealth (HOSPITAL_COMMUNITY): Payer: Self-pay | Admitting: *Deleted

## 2020-06-22 NOTE — Telephone Encounter (Signed)
Reaching out to patient to offer assistance regarding upcoming cardiac imaging study; pt verbalizes understanding of appt date/time, parking situation and where to check in, pre-test NPO status, and verified current allergies; name and call back number provided for further questions should they arise  Coleson Kant RN Navigator Cardiac Imaging [facility] [phone] office [phone] cell  

## 2020-06-24 ENCOUNTER — Encounter (HOSPITAL_COMMUNITY): Payer: Self-pay

## 2020-06-24 ENCOUNTER — Other Ambulatory Visit: Payer: Self-pay

## 2020-06-24 ENCOUNTER — Ambulatory Visit (HOSPITAL_COMMUNITY)
Admission: RE | Admit: 2020-06-24 | Discharge: 2020-06-24 | Disposition: A | Discharge status: Home / Self Care | Payer: Medicare Other | Source: Ambulatory Visit | Attending: Physician Assistant | Admitting: Physician Assistant

## 2020-06-24 DIAGNOSIS — R079 Chest pain, unspecified: Secondary | ICD-10-CM | POA: Diagnosis not present

## 2020-06-24 DIAGNOSIS — I517 Cardiomegaly: Secondary | ICD-10-CM | POA: Diagnosis not present

## 2020-06-24 DIAGNOSIS — I2510 Atherosclerotic heart disease of native coronary artery without angina pectoris: Secondary | ICD-10-CM | POA: Diagnosis not present

## 2020-06-24 DIAGNOSIS — I700 Atherosclerosis of aorta: Secondary | ICD-10-CM

## 2020-06-24 IMAGING — CT CT HEART MORP W/ CTA COR W/ SCORE W/ CA W/CM &/OR W/O CM
4 of 7 series · 8 of 20 positions shown, 9 images · IV contrast (APPLIED)
Comparison: None.
COMPARISON: None.

Addendum:
EXAM:
OVER-READ INTERPRETATION  CT CHEST

The following report is an over-read performed by radiologist Dr.
over-read does not include interpretation of cardiac or coronary
anatomy or pathology. The coronary CTA interpretation by the
cardiologist is attached.
CLINICAL DATA: 69 year old with chest pain
Cardiac/Coronary  CTA
TECHNIQUE: The patient was scanned on a Phillips Force scanner.

[Series 6: best diast 73 % · axial · 0.39mm/px · z∈[+1265,+1310]mm · 2 of 338 slices shown]
[im 113/338  vessel]
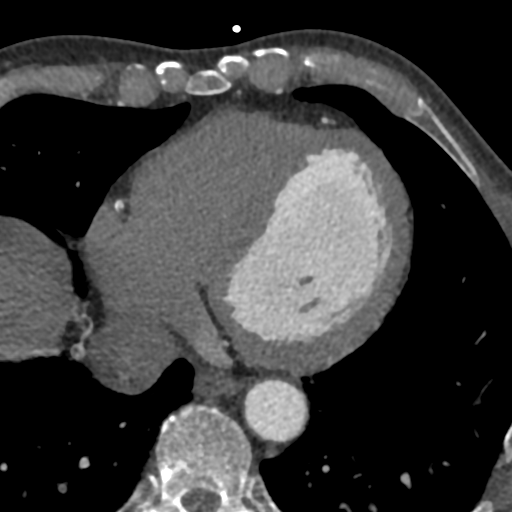
[im 225/338  vessel]
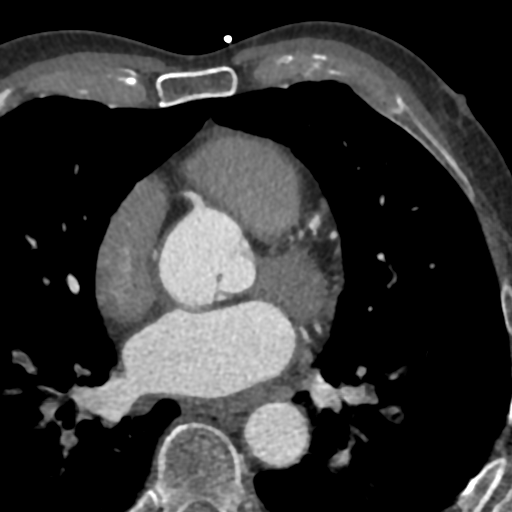

[Series 7: best syst · axial · 0.39mm/px · z∈[+1265,+1310]mm · 2 of 338 slices shown, 3 images]
[im 113/338  vessel]
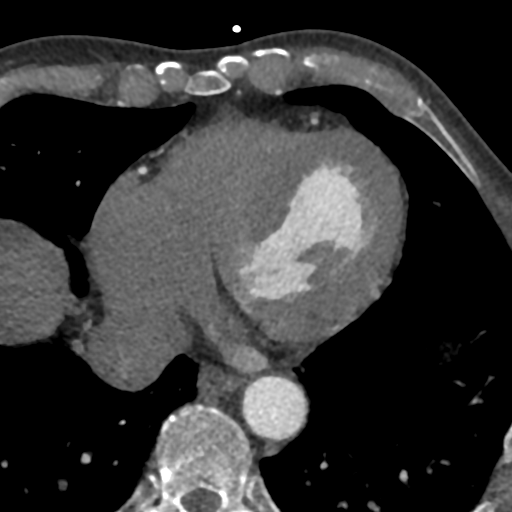
[im 113/338  lung]
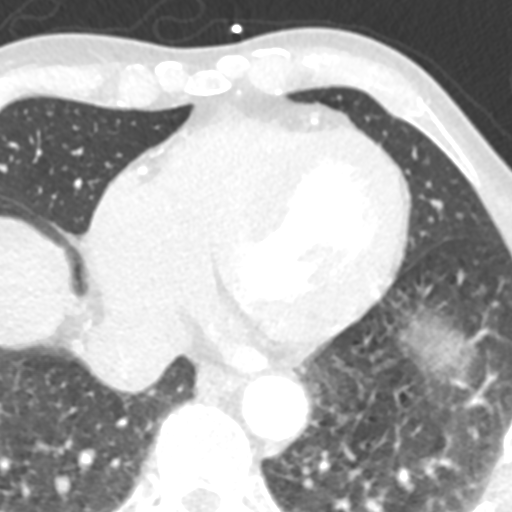
[im 225/338  vessel]
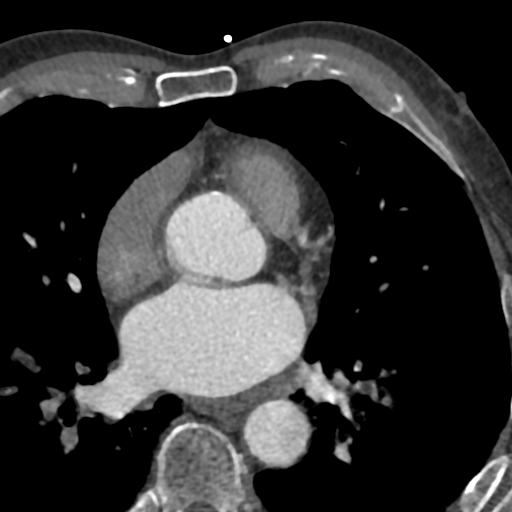

[Series 8: ts diast sharp 73 % · axial · 0.39mm/px · z∈[+1265,+1310]mm · 2 of 338 slices shown]
[im 113/338  lung]
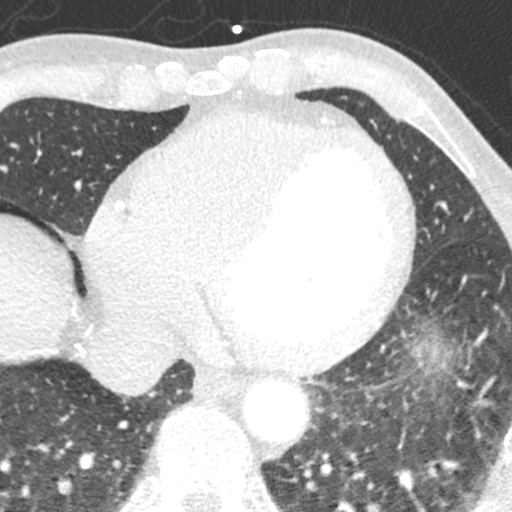
[im 225/338  lung]
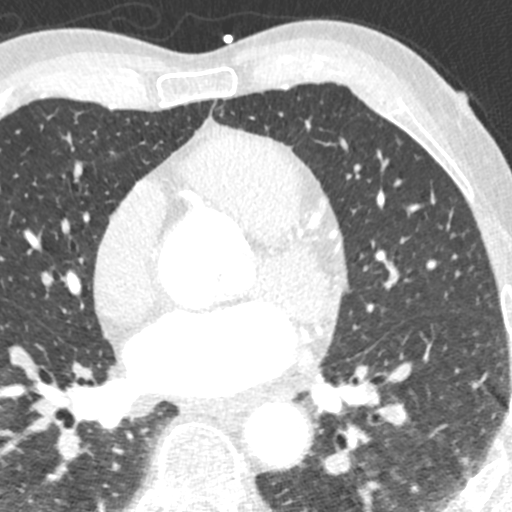

[Series 9: ts syst sharp · axial · 0.39mm/px · z∈[+1265,+1310]mm · 2 of 338 slices shown]
[im 113/338  lung]
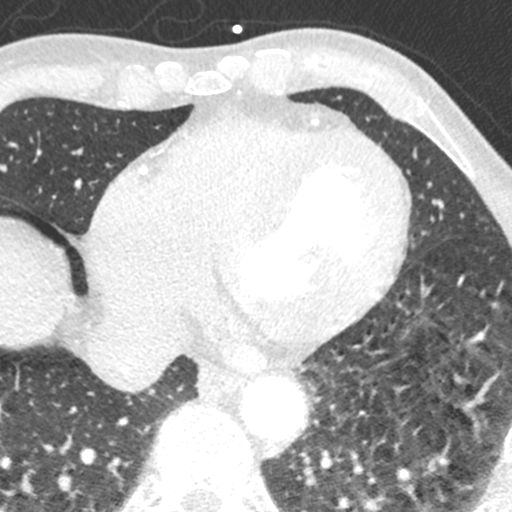
[im 225/338  lung]
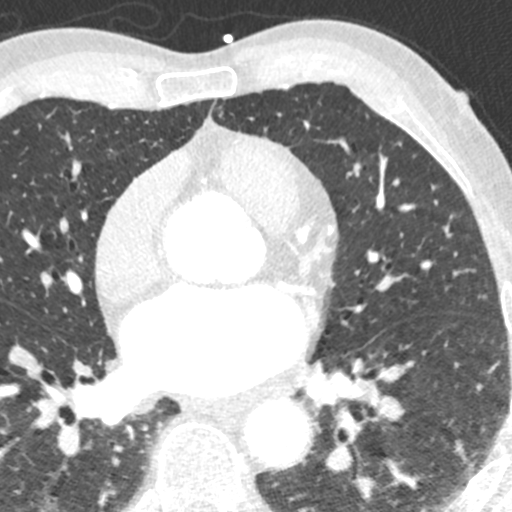

[8 of 20 positions shown; findings below may reference images not displayed]

FINDINGS: Vascular: No significant vascular findings. Normal heart size. No
pericardial effusion.

Mediastinum/Nodes: Visualized mediastinum and hilar regions
demonstrate no lymphadenopathy or masses.

Lungs/Pleura: Visualized lungs show no evidence of pulmonary edema,
consolidation, pneumothorax, nodule or pleural fluid.

Upper Abdomen: No acute abnormality.

Musculoskeletal: No chest wall mass or suspicious bone lesions
identified.
IMPRESSION: No significant incidental findings.
FINDINGS: A 120 kV prospective scan was triggered in the descending thoracic
aorta at 111 HU's. Axial non-contrast 3 mm slices were carried out
through the heart. The data set was analyzed on a dedicated work
station and scored using the Agatson method. Gantry rotation speed
was 250 msecs and collimation was .6 mm. No beta blockade and 0.8 mg
of sl NTG was given. The 3D data set was reconstructed in 5%
intervals of the 67-82 % of the R-R cycle. Diastolic phases were
analyzed on a dedicated work station using MPR, MIP and VRT modes.
The patient received 80 cc of contrast.

Aorta: 40 mm Sinus of Valsalva, mildly dilated. 31 mm ascending
root. Aortic atherosclerosis, descending. No dissection.

Aortic Valve:  Trileaflet.  No calcifications.

Coronary Arteries:  Normal coronary origin.  Right dominance.

RCA is a large dominant artery that gives rise to PDA and PLA. There
is scattered calcified plaque 0-24%. One focal non calcified plaque
in the distal RCA prior to PDA takeoff that is 0-24%.

Left main is a large artery that gives rise to LAD and LCX arteries.

LAD is a large vessel that has diffuse calcified plaque 0-24% with a
focal non calcified plaque after the first diagonal branch of 75-99%
stenosis. Will send for FFR for clarification.

LCX is a non-dominant artery that gives rise to one large OM1
branch. There is scattered calcified/ non calcified plaque appears
non flow limiting.

Other findings:

Normal pulmonary vein drainage into the left atrium.

Normal left atrial appendage without a thrombus.

Normal size of the pulmonary artery.

Please see radiology report for non cardiac findings.
IMPRESSION: 1. Coronary calcium score of 201. This was 60 percentile for age and
sex matched control.

2. Normal coronary origin with right dominance.

3. 75-99% mid LAD non calcified plaque. Sending for FFR for further
clarification. Otherwise scattered calcified plaque through all
vessels.

4. Aortic atherosclerosis, descending. Mildly dilated sinus of
Valsalva - 40 mm. Normal ascending root otherwise.

5.  Recommend cardiac catheterization for possible PCI to LAD.

*** End of Addendum ***
EXAM:
OVER-READ INTERPRETATION  CT CHEST

The following report is an over-read performed by radiologist Dr.
over-read does not include interpretation of cardiac or coronary
anatomy or pathology. The coronary CTA interpretation by the
cardiologist is attached.
FINDINGS: Vascular: No significant vascular findings. Normal heart size. No
pericardial effusion.

Mediastinum/Nodes: Visualized mediastinum and hilar regions
demonstrate no lymphadenopathy or masses.

Lungs/Pleura: Visualized lungs show no evidence of pulmonary edema,
consolidation, pneumothorax, nodule or pleural fluid.

Upper Abdomen: No acute abnormality.

Musculoskeletal: No chest wall mass or suspicious bone lesions
identified.
IMPRESSION: No significant incidental findings.

## 2020-06-24 MED ORDER — NITROGLYCERIN 0.4 MG SL SUBL
SUBLINGUAL_TABLET | SUBLINGUAL | Status: AC
  Filled 2020-06-24: qty 2

## 2020-06-24 MED ORDER — NITROGLYCERIN 0.4 MG SL SUBL
0.8000 mg | SUBLINGUAL_TABLET | Freq: Once | SUBLINGUAL | Status: AC
  Administered 2020-06-24: 0.8 mg via SUBLINGUAL

## 2020-06-24 MED ORDER — IOHEXOL 350 MG/ML SOLN
80.0000 mL | Freq: Once | INTRAVENOUS | Status: AC | PRN
  Administered 2020-06-24: 80 mL via INTRAVENOUS

## 2020-06-25 DIAGNOSIS — R931 Abnormal findings on diagnostic imaging of heart and coronary circulation: Secondary | ICD-10-CM

## 2020-06-25 DIAGNOSIS — I2510 Atherosclerotic heart disease of native coronary artery without angina pectoris: Secondary | ICD-10-CM | POA: Diagnosis not present

## 2020-06-25 DIAGNOSIS — I700 Atherosclerosis of aorta: Secondary | ICD-10-CM | POA: Diagnosis not present

## 2020-06-28 ENCOUNTER — Encounter: Payer: Self-pay | Admitting: Cardiovascular Disease

## 2020-06-28 ENCOUNTER — Other Ambulatory Visit: Payer: Self-pay

## 2020-06-28 ENCOUNTER — Ambulatory Visit: Payer: Medicare Other | Admitting: Cardiovascular Disease

## 2020-06-28 VITALS — BP 120/80 | HR 62 | Ht 70.0 in | Wt 163.0 lb

## 2020-06-28 DIAGNOSIS — I2584 Coronary atherosclerosis due to calcified coronary lesion: Secondary | ICD-10-CM

## 2020-06-28 DIAGNOSIS — I2510 Atherosclerotic heart disease of native coronary artery without angina pectoris: Secondary | ICD-10-CM

## 2020-06-28 DIAGNOSIS — I480 Paroxysmal atrial fibrillation: Secondary | ICD-10-CM | POA: Diagnosis not present

## 2020-06-28 NOTE — Progress Notes (Unsigned)
Cardiology Office Note:    Date:  06/30/2020   ID:  Jack Cobb, DOB 17-Jul-1950, MRN [id number]  PCP:  Aida Puffer, MD  [facility] Cardiologist:  No primary care provider on file.  [facility] Electrophysiologist:  None   Referring MD: Aida Puffer, MD   Chief Complaint  Patient presents with  . Coronary Artery Disease    History of Present Illness:    Jack Cobb is a 70 y.o. male presenting for evaluation of coronary artery disease.  The patient was recently hospitalized overnight with atrial fibrillation of new onset.  He developed heart palpitations and found that his heart rate was elevated greater than 150 bpm.  He went to the emergency room and was found to be in atrial fibrillation with RVR.  The patient was treated with IV Cardizem overnight and he converted spontaneously back to sinus rhythm.  He was started on Cardizem CD for heart rate control and apixaban for oral anticoagulation.  He was noted to have minimal elevation of high-sensitivity troponin with a flat trend of 30-65-54.  Because of the T wave abnormality on his EKG, a gated coronary CTA was recommended as an outpatient.  This demonstrated a coronary calcium score of 201 placing him at the 60th percentile for age and gender.  He was noted to have 75 to 99% stenosis of the mid LAD with scattered nonobstructive plaquing elsewhere.  Cardiac catheterization with possible LAD PCI was recommended and he presents today for further evaluation.  The patient's coronary CTA was sent for [facility] evaluation.  This showed an FFR of 0.7 in the first diagonal.  The LAD FFR was 0.73 at the apex but was actually normal beyond the area of concern at 0.88 and 0.83.  The patient has no symptoms of angina.  He does have some fatigue and exercise intolerance with shortness of breath when he is due to take his medication for Parkinson's disease.  He specifically denies exertional chest pain or pressure.  He has had no other episodes of  heart palpitations.  Past Medical History:  Diagnosis Date  . Coronary artery disease   . Hypertension   . Parkinson disease [facility])     Past Surgical History:  Procedure Laterality Date  . HERNIA REPAIR      Current Medications: Current Meds  Medication Sig  . apixaban (ELIQUIS) 5 MG TABS tablet Take 1 tablet (5 mg total) by mouth 2 (two) times daily.  . carbidopa-levodopa (SINEMET IR) 25-100 MG tablet Take 1 tablet by mouth 6 (six) times daily.  Marland Kitchen diltiazem (CARDIZEM CD) 180 MG 24 hr capsule Take 1 capsule (180 mg total) by mouth daily.  . pramipexole (MIRAPEX) 1 MG tablet Take 1 tablet (1 mg total) by mouth 3 (three) times daily.  . rasagiline (AZILECT) 1 MG TABS tablet Take 1 tablet (1 mg total) by mouth daily.     Allergies:   Patient has no known allergies.   Social History   Socioeconomic History  . Marital status: Married    Spouse name: Not on file  . Number of children: 3  . Years of education: 12th  . Highest education level: Not on file  Occupational History  . Occupation: Retired from tile work    Comment: retired  Tobacco Use  . Smoking status: Never Smoker  . Smokeless tobacco: Former Neurosurgeon    Types: Chew  . Tobacco comment: quit 8 years ago  Substance and Sexual Activity  . Alcohol use: No  .  Drug use: No  . Sexual activity: Not on file  Other Topics Concern  . Not on file  Social History Narrative   Patient is right handed, resides alone, mother resides with him 1/2 the time,has dementia   Social Determinants of Health   Financial Resource Strain: Not on file  Food Insecurity: Not on file  Transportation Needs: Not on file  Physical Activity: Not on file  Stress: Not on file  Social Connections: Not on file     Family History: The patient's family history includes Heart attack (age of onset: 44) in his father; Heart disease in his brother; Unexplained death (age of onset: 65) in his mother.  ROS:   Please see the history of present  illness.    All other systems reviewed and are negative.  EKGs/Labs/Other Studies Reviewed:    The following studies were reviewed today: Echo 06/13/2020: IMPRESSIONS    1. Left ventricular ejection fraction, by estimation, is 60 to 65%. The  left ventricle has normal function. The left ventricle has no regional  wall motion abnormalities. Left ventricular diastolic function could not  be evaluated.  2. Right ventricular systolic function is normal. The right ventricular  size is normal. Tricuspid regurgitation signal is inadequate for assessing  PA pressure.  3. Left atrial size was mildly dilated.  4. The mitral valve is normal in structure. No evidence of mitral valve  regurgitation. No evidence of mitral stenosis.  5. The aortic valve is tricuspid. Aortic valve regurgitation is not  visualized. Mild aortic valve sclerosis is present, with no evidence of  aortic valve stenosis.  6. Aortic dilatation noted. There is mild dilatation of the aortic root,  measuring 38 mm.  7. The inferior vena cava is normal in size with greater than 50%  respiratory variability, suggesting right atrial pressure of 3 mmHg.   EKG:  EKG is not ordered today.    Recent Labs: 06/12/2020: BUN 20; Creatinine, Ser 1.00; Hemoglobin 15.3; Magnesium 1.9; Platelets 240; Potassium 3.6; Sodium 142; TSH 2.197 06/29/2020: ALT 7  Recent Lipid Panel    Component Value Date/Time   CHOL 213 (H) 06/29/2020 0759   TRIG 88 06/29/2020 0759   HDL 66 06/29/2020 0759   CHOLHDL 3.2 06/29/2020 0759   LDLCALC 131 (H) 06/29/2020 0759     Risk Assessment/Calculations:    CHA2DS2-VASc Score =    This indicates a  % annual risk of stroke. The patient's score is based upon:        Physical Exam:    VS:  BP 120/80   Pulse 62   Ht 5\' 10"  (1.778 m)   Wt 163 lb (73.9 kg)   SpO2 96%   BMI 23.39 kg/m     Wt Readings from Last 3 Encounters:  06/28/20 163 lb (73.9 kg)  06/14/20 156 lb 4.9 oz (70.9 kg)   05/04/20 160 lb (72.6 kg)     GEN:  Well nourished, well developed in no acute distress HEENT: Normal NECK: No JVD; No carotid bruits LYMPHATICS: No lymphadenopathy CARDIAC: RRR, no murmurs, rubs, gallops RESPIRATORY:  Clear to auscultation without rales, wheezing or rhonchi  ABDOMEN: Soft, non-tender, non-distended MUSCULOSKELETAL:  No edema; No deformity  SKIN: Warm and dry NEUROLOGIC:  Alert and oriented x 3 PSYCHIATRIC:  Normal affect   ASSESSMENT:    1. Coronary artery calcification   2. Coronary artery disease involving native coronary artery of native heart without angina pectoris   3. Paroxysmal atrial fibrillation (HCC)  PLAN:    In order of problems listed above:  1. The patient has coronary artery disease with FFR positive stenosis in a diagonal branch of the LAD.  He has a lesion in the mid LAD that by my review does not appear hemodynamically significant based on FFR evaluation in the mid vessel.  The apical LAD FFR is abnormal but this really has not correlated well with significant stenosis based on our experience with invasive cardiac catheterization.  And an absence of any anginal chest discomfort, I think medical therapy is indicated.  I have recommended continuing his current medical program.  Will add a lipid panel and LFTs today and plan to initiate a high intensity statin drug.  Patient understands that if he is to develop any chest pain or pressure, he should seek immediate medical attention.  He otherwise can follow-up with Dr. Mayford Knife for continued care.  We will set up an appointment and approximately 2 to 3 months. 2. See discussion above. 3. The patient is maintaining sinus rhythm.  He seems to be tolerating long-acting diltiazem well.  He is tolerating oral anticoagulation with apixaban.  No changes are recommended today.  Medication Adjustments/Labs and Tests Ordered: Current medicines are reviewed at length with the patient today.  Concerns regarding  medicines are outlined above.  Orders Placed This Encounter  Procedures  . Hepatic function panel  . Lipid panel   No orders of the defined types were placed in this encounter.   Patient Instructions  Medication Instructions:  Your provider recommends that you continue on your current medications as directed. Please refer to the Current Medication list given to you today.   *If you need a refill on your cardiac medications before your next appointment, please call your pharmacy*  Lab Work: Your provider recommends that you return for lab work. If you have labs (blood work) drawn today and your tests are completely normal, you will receive your results only by: Marland Kitchen MyChart Message (if you have MyChart) OR . A paper copy in the mail If you have any lab test that is abnormal or we need to change your treatment, we will call you to review the results.  Follow-Up: Your AFib Clinic appointment has been cancelled.   Dr. Excell Seltzer recommends that you schedule a follow-up appointment with Dr. Mayford Knife in 2-3 months.    Signed, Tonny Bollman, MD  06/30/2020 2:20 PM    [facility]

## 2020-06-28 NOTE — Patient Instructions (Addendum)
Medication Instructions:  Your provider recommends that you continue on your current medications as directed. Please refer to the Current Medication list given to you today.   *If you need a refill on your cardiac medications before your next appointment, please call your pharmacy*  Lab Work: Your provider recommends that you return for lab work. If you have labs (blood work) drawn today and your tests are completely normal, you will receive your results only by: Marland Kitchen MyChart Message (if you have MyChart) OR . A paper copy in the mail If you have any lab test that is abnormal or we need to change your treatment, we will call you to review the results.  Follow-Up: Your AFib Clinic appointment has been cancelled.   Dr. Excell Seltzer recommends that you schedule a follow-up appointment with Dr. Mayford Knife in 2-3 months.

## 2020-06-29 ENCOUNTER — Other Ambulatory Visit: Payer: Self-pay | Admitting: *Deleted

## 2020-06-29 ENCOUNTER — Other Ambulatory Visit: Payer: Medicare Other

## 2020-06-29 ENCOUNTER — Other Ambulatory Visit: Payer: Self-pay

## 2020-06-29 DIAGNOSIS — I2510 Atherosclerotic heart disease of native coronary artery without angina pectoris: Secondary | ICD-10-CM

## 2020-06-29 LAB — HEPATIC FUNCTION PANEL
ALT: 7 IU/L (ref 0–44)
AST: 15 IU/L (ref 0–40)
Albumin: 4.7 g/dL (ref 3.8–4.8)
Alkaline Phosphatase: 102 IU/L (ref 44–121)
Bilirubin Total: 0.6 mg/dL (ref 0.0–1.2)
Bilirubin, Direct: 0.16 mg/dL (ref 0.00–0.40)
Total Protein: 7 g/dL (ref 6.0–8.5)

## 2020-06-29 LAB — LIPID PANEL
Chol/HDL Ratio: 3.2 ratio (ref 0.0–5.0)
Cholesterol, Total: 213 mg/dL — ABNORMAL HIGH (ref 100–199)
HDL: 66 mg/dL (ref >39)
LDL Chol Calc (NIH): 131 mg/dL — ABNORMAL HIGH (ref 0–99)
Triglycerides: 88 mg/dL (ref 0–149)
VLDL Cholesterol Cal: 16 mg/dL (ref 5–40)

## 2020-06-30 ENCOUNTER — Telehealth: Payer: Self-pay

## 2020-06-30 ENCOUNTER — Encounter: Payer: Self-pay | Admitting: Cardiovascular Disease

## 2020-06-30 DIAGNOSIS — I2510 Atherosclerotic heart disease of native coronary artery without angina pectoris: Secondary | ICD-10-CM

## 2020-06-30 DIAGNOSIS — I2584 Coronary atherosclerosis due to calcified coronary lesion: Secondary | ICD-10-CM

## 2020-06-30 NOTE — Telephone Encounter (Signed)
Reviewed results with patient who verbalized understanding.   The patient declines medication changes at this time. He will eat a healthy diet and get more exercise. Scheduled him for repeat FLP 6/1. He was grateful for call and agrees with plan.

## 2020-06-30 NOTE — Telephone Encounter (Signed)
-----   Message from Tonny Bollman, MD sent at 06/30/2020  2:25 PM EST ----- LDL above goal. Recommend rosuvastatin 20 mg daily. thanks

## 2020-07-01 ENCOUNTER — Ambulatory Visit (HOSPITAL_COMMUNITY): Payer: Medicare Other | Admitting: Physician Assistant

## 2020-07-14 ENCOUNTER — Other Ambulatory Visit: Payer: Self-pay

## 2020-07-14 MED ORDER — DILTIAZEM HCL ER COATED BEADS 180 MG PO CP24: 180.0000 mg | ORAL_CAPSULE | Freq: Every day | ORAL | 3 refills | Status: DC

## 2020-07-14 MED ORDER — APIXABAN 5 MG PO TABS: 5.0000 mg | ORAL_TABLET | Freq: Two times a day (BID) | ORAL | 5 refills | Status: DC

## 2020-07-14 NOTE — Telephone Encounter (Signed)
Prescription refill request for Eliquis received.  Indication: Afib Last office visit: Excell Seltzer 06/28/2020 Scr: 1.00, 06/12/2020 Age: 70 yo  Weight: 73.9 kg   Pt is on the correct dose of per dosing criteria, Eliqius 5mg  BID. Prescription refill sent.

## 2020-09-06 ENCOUNTER — Other Ambulatory Visit: Payer: Self-pay

## 2020-09-06 ENCOUNTER — Ambulatory Visit: Payer: Medicare Other | Admitting: Cardiology

## 2020-09-06 ENCOUNTER — Encounter: Payer: Self-pay | Admitting: Cardiology

## 2020-09-06 VITALS — BP 110/58 | HR 59 | Ht 70.0 in | Wt 152.0 lb

## 2020-09-06 DIAGNOSIS — I480 Paroxysmal atrial fibrillation: Secondary | ICD-10-CM | POA: Diagnosis not present

## 2020-09-06 DIAGNOSIS — I10 Essential (primary) hypertension: Secondary | ICD-10-CM

## 2020-09-06 DIAGNOSIS — E7800 Pure hypercholesterolemia, unspecified: Secondary | ICD-10-CM | POA: Diagnosis not present

## 2020-09-06 DIAGNOSIS — I2510 Atherosclerotic heart disease of native coronary artery without angina pectoris: Secondary | ICD-10-CM

## 2020-09-06 DIAGNOSIS — E785 Hyperlipidemia, unspecified: Secondary | ICD-10-CM

## 2020-09-06 HISTORY — DX: Hyperlipidemia, unspecified: E78.5

## 2020-09-06 HISTORY — DX: Atherosclerotic heart disease of native coronary artery without angina pectoris: I25.10

## 2020-09-06 NOTE — Addendum Note (Signed)
Addended by: Theresia Majors on: 09/06/2020 08:38 AM   Modules accepted: Orders

## 2020-09-06 NOTE — Progress Notes (Signed)
Cardiology Office Note:    Date:  09/06/2020   ID:  ETHON WYMER, DOB 27-Jul-1950, MRN [id number]  PCP:  Aida Puffer, MD  Cardiologist:  Armanda Magic, MD    Referring MD: Aida Puffer, MD   Chief Complaint  Patient presents with  . Coronary Artery Disease  . Atrial Fibrillation  . Hypertension  . Hyperlipidemia    History of Present Illness:    Jack Cobb is a 70 y.o. male with a hx of ASCAD, PAF, HTN and Parkinson's disease.  He was hospitalized in Jan 2022 with afib with RVR and converted spontaneously on Cardizem to NSR. hsTrop was elevated and coronary CTA was done showing a coronary calcium score of 201 placing him at the 60th percentile for age and gender.  He was noted to have 75 to 99% stenosis of the mid LAD with scattered nonobstructive plaquing elsewhere.    He was seen by Dr. Excell Seltzer for discussion of possible Cardiac catheterization with possible LAD PCI was recommended and he presents today for further evaluation.  The patient's coronary CTA was sent for [facility] evaluation.  This showed an FFR of 0.7 in the first diagonal.  The LAD FFR was 0.73 at the apex but was actually normal beyond the area of concern at 0.88 and 0.83. Medical management was recommended given that the apical LAD FFR has not correlated well with in lab findings at cath and he was not having any chest pain.   He is here today for followup and is doing well.  He denies any chest pain or pressure, SOB, DOE, PND, orthopnea, LE edema, dizziness, palpitations or syncope. He is compliant with his meds and is tolerating meds with no SE.    Past Medical History:  Diagnosis Date  . CAD (coronary artery disease), native coronary artery 09/06/2020   Coronary CTA was done showing a coronary calcium score of 201 placing him at the 60th percentile for age and gender.  He was noted to have 75 to 99% stenosis of the mid LAD with scattered nonobstructive plaquing elsewhere.  FFR 0.7 in D1 and 0.73 at the  apex>>medical management  . HLD (hyperlipidemia) 09/06/2020  . Hypertension   . Parkinson disease [facility])     Past Surgical History:  Procedure Laterality Date  . HERNIA REPAIR      Current Medications: Current Meds  Medication Sig  . apixaban (ELIQUIS) 5 MG TABS tablet Take 1 tablet (5 mg total) by mouth 2 (two) times daily.  . carbidopa-levodopa (SINEMET IR) 25-100 MG tablet Take 1 tablet by mouth 6 (six) times daily.  Marland Kitchen diltiazem (CARDIZEM CD) 180 MG 24 hr capsule Take 1 capsule (180 mg total) by mouth daily.  . pramipexole (MIRAPEX) 1 MG tablet Take 1 tablet (1 mg total) by mouth 3 (three) times daily.  . rasagiline (AZILECT) 1 MG TABS tablet Take 1 tablet (1 mg total) by mouth daily.     Allergies:   Patient has no known allergies.   Social History   Socioeconomic History  . Marital status: Married    Spouse name: Not on file  . Number of children: 3  . Years of education: 12th  . Highest education level: Not on file  Occupational History  . Occupation: Retired from tile work    Comment: retired  Tobacco Use  . Smoking status: Never Smoker  . Smokeless tobacco: Former Neurosurgeon    Types: Chew  . Tobacco comment: quit 8 years ago  Substance and  Sexual Activity  . Alcohol use: No  . Drug use: No  . Sexual activity: Not on file  Other Topics Concern  . Not on file  Social History Narrative   Patient is right handed, resides alone, mother resides with him 1/2 the time,has dementia   Social Determinants of Health   Financial Resource Strain: Not on file  Food Insecurity: Not on file  Transportation Needs: Not on file  Physical Activity: Not on file  Stress: Not on file  Social Connections: Not on file     Family History: The patient's family history includes Heart attack (age of onset: 74) in his father; Heart disease in his brother; Unexplained death (age of onset: 69) in his mother.  ROS:   Please see the history of present illness.    ROS  All other systems  reviewed and negative.   EKGs/Labs/Other Studies Reviewed:    The following studies were reviewed today: Coronary CTA  EKG:  EKG is not ordered today.    Recent Labs: 06/12/2020: BUN 20; Creatinine, Ser 1.00; Hemoglobin 15.3; Magnesium 1.9; Platelets 240; Potassium 3.6; Sodium 142; TSH 2.197 06/29/2020: ALT 7   Recent Lipid Panel    Component Value Date/Time   CHOL 213 (H) 06/29/2020 0759   TRIG 88 06/29/2020 0759   HDL 66 06/29/2020 0759   CHOLHDL 3.2 06/29/2020 0759   LDLCALC 131 (H) 06/29/2020 0759    CHA2DS2-VASc Score = 2 [CHF History: No, HTN History: No, Diabetes History: No, Stroke History: No, Vascular Disease History: Yes, Age Score: 1, Gender Score: 0].  Therefore, the patient's annual risk of stroke is 2.2 %.        Physical Exam:    VS:  BP (!) 110/58   Pulse (!) 59   Ht 5\' 10"  (1.778 m)   Wt 152 lb (68.9 kg)   SpO2 98%   BMI 21.81 kg/m     Wt Readings from Last 3 Encounters:  09/06/20 152 lb (68.9 kg)  06/28/20 163 lb (73.9 kg)  06/14/20 156 lb 4.9 oz (70.9 kg)     GEN:  Well nourished, well developed in no acute distress HEENT: Normal NECK: No JVD; No carotid bruits LYMPHATICS: No lymphadenopathy CARDIAC: RRR, no murmurs, rubs, gallops RESPIRATORY:  Clear to auscultation without rales, wheezing or rhonchi  ABDOMEN: Soft, non-tender, non-distended MUSCULOSKELETAL:  No edema; No deformity  SKIN: Warm and dry NEUROLOGIC:  Alert and oriented x 3 PSYCHIATRIC:  Normal affect   ASSESSMENT:    1. Coronary artery disease involving native coronary artery of native heart without angina pectoris   2. Pure hypercholesterolemia   3. PAF (paroxysmal atrial fibrillation) (HCC)   4. Essential hypertension    PLAN:    In order of problems listed above:  1.  ASCAD -coronary CTA was done showing a coronary calcium score of 201 placing him at the 60th percentile for age and gender.  He was noted to have 75 to 99% stenosis of the mid LAD with scattered  nonobstructive plaquing elsewhere.   -given lack of CP and FFR with only distal reduction in flow>>medical management was recommended -he has not had any anginal symptoms -no ASA due to DOAC -start Lipitor 40mg  daily  2.  HLD -LDL goal is < 70 -LDL was 06/16/20 in Feb -he does not want to start a statin because he has memory issues with his Parkinson's  -will refer to lipid clnic -nutrition consult  3.  PAF -he is maintaining NSR on  exam with no palpitations -CHADS2VASC score is 3 (HTN, age>65 and CAD) -he denies any bleeding problems on the DOAC -continue Apixaban 5mg  BID and Cardizem CD 180mg  daily  4.  HTN -BP controlled on exam today -continue Cardizem CD 180mg  daily   Medication Adjustments/Labs and Tests Ordered: Current medicines are reviewed at length with the patient today.  Concerns regarding medicines are outlined above.  No orders of the defined types were placed in this encounter.  No orders of the defined types were placed in this encounter.   Signed, , MD  09/06/2020 8:23 AM    [facility]

## 2020-09-06 NOTE — Patient Instructions (Signed)
Medication Instructions:  Your physician recommends that you continue on your current medications as directed. Please refer to the Current Medication list given to you today.  *If you need a refill on your cardiac medications before your next appointment, please call your pharmacy*  Follow-Up: At [facility], you and your health needs are our priority.  As part of our continuing mission to provide you with exceptional heart care, we have created designated Provider Care Teams.  These Care Teams include your primary Cardiologist (physician) and Advanced Practice Providers (APPs -  Physician Assistants and Nurse Practitioners) who all work together to provide you with the care you need, when you need it.  Your next appointment:   6 month(s)  The format for your next appointment:   In Person  Provider:   You may see Armanda Magic, MD or one of the following Advanced Practice Providers on your designated Care Team:    Ronie Spies, PA-C  Jacolyn Reedy, PA-C  Other Instructions You have been referred to see our Pharmacist in the Lipid Clinic.   You have been referred to see a nutritionist.

## 2020-09-27 ENCOUNTER — Other Ambulatory Visit: Payer: Self-pay

## 2020-09-27 ENCOUNTER — Other Ambulatory Visit: Payer: Self-pay | Admitting: Neurology

## 2020-09-27 ENCOUNTER — Ambulatory Visit (INDEPENDENT_AMBULATORY_CARE_PROVIDER_SITE_OTHER): Payer: Medicare Other | Admitting: Pharmacy Technician

## 2020-09-27 VITALS — Wt 147.0 lb

## 2020-09-27 DIAGNOSIS — E7800 Pure hypercholesterolemia, unspecified: Secondary | ICD-10-CM | POA: Diagnosis not present

## 2020-09-27 DIAGNOSIS — G20 Parkinson's disease: Secondary | ICD-10-CM

## 2020-09-27 DIAGNOSIS — I2510 Atherosclerotic heart disease of native coronary artery without angina pectoris: Secondary | ICD-10-CM

## 2020-09-27 LAB — LDL CHOLESTEROL, DIRECT: LDL Direct: 85 mg/dL (ref 0–99)

## 2020-09-27 NOTE — Progress Notes (Signed)
Patient ID: Jack Cobb                 DOB: [birth year]                    MRN: [id number]     HPI: Jack Cobb is a 70 y.o. male patient referred to lipid clinic by Dr. Mayford Knife. PMH is significant for ASCAD, PAF, HTN, HLD, and Parkinson's disease. Coronary CT in Jan 2022 revealed coronary calcium score of 201 (60th percentile for age and gender) with 75-99% stenosis of mid LAD.   Patient was last seen by Dr. Mayford Knife on 09/06/20 for follow-up after hospitalization for afib and coronary CTA. Patient noted concerns for starting statin therapy due to memory issues with his Parkinson's.    Patient and wife present to lipid clinic for initial visit. Patient endorses a history of Parkinson's for 15 years with recent increases in memory issues and muscle weakness. Since patient's lipid panel revealed an elevated LDL of 131 in February, patient has significantly changed his diet. His wife noted a concerning 10 pound weight loss over this period of time but patient endorses still having a strong appetite and eating regularly. Of note, the patient is on Sinemet-IR six times daily which limits his ability to eat dairy products. They are scheduled to meet with a dietician later this month.   Current Medications: none Intolerances: none Risk Factors: ASCAD, HTN, HLD, coronary calcium score, family history LDL goal: <70  Diet: significant dietary changes over past 3 months - cut out red meat, fried chicken, eggs, ice cream, sweets, honey buns - started eating [place], salmon, baked chicken  Exercise: farm work   Family History: father - heart attack, age 82; brother - heart disease  Social History: never smoker, denies alcohol or drug use  Labs: Lipid Panel 06/29/20: TC 213, TG 88, HDL 66, LDL calc 131 (not on therapy, prior to dietary changes)  Past Medical History:  Diagnosis Date  . CAD (coronary artery disease), native coronary artery 09/06/2020   Coronary CTA was done showing a coronary  calcium score of 201 placing him at the 60th percentile for age and gender.  He was noted to have 75 to 99% stenosis of the mid LAD with scattered nonobstructive plaquing elsewhere.  FFR 0.7 in D1 and 0.73 at the apex>>medical management  . HLD (hyperlipidemia) 09/06/2020  . Hypertension   . Parkinson disease [facility])     Current Outpatient Medications on File Prior to Visit  Medication Sig Dispense Refill  . apixaban (ELIQUIS) 5 MG TABS tablet Take 1 tablet (5 mg total) by mouth 2 (two) times daily. 60 tablet 5  . carbidopa-levodopa (SINEMET IR) 25-100 MG tablet Take 1 tablet by mouth 6 (six) times daily. 540 tablet 3  . diltiazem (CARDIZEM CD) 180 MG 24 hr capsule Take 1 capsule (180 mg total) by mouth daily. 90 capsule 3  . pramipexole (MIRAPEX) 1 MG tablet Take 1 tablet (1 mg total) by mouth 3 (three) times daily. 270 tablet 1  . rasagiline (AZILECT) 1 MG TABS tablet Take 1 tablet (1 mg total) by mouth daily. 90 tablet 3   No current facility-administered medications on file prior to visit.    No Known Allergies  Assessment/Plan:  1. Hyperlipidemia - Patient's LDL is above goal <70 without any lipid lowering therapy however patient has had significant dietary changes over the past few months. We will check a direct LDL today, per pt request, and  call the patient with his results. We discussed if the patient's LDL remains elevated we can try a hydrophilic statin such as rosuvastatin to avoid a statin that would cross the blood brain barrier or has a higher risk of myalgias. Patient was encouraged to continue his dietary changes but ensure he is getting enough calories to avoid further weight loss. He is to meet with a dietician later this month.    Jack Cobb, PharmD PGY1 Acute Care Pharmacy Resident 09/27/2020 8:02 AM

## 2020-09-27 NOTE — Patient Instructions (Addendum)
It was great meeting you today!   We will draw a cholesterol panel today to see how your diet has affected your cholesterol levels. If it is elevated, I would recommend Crestor (rosuvastatin) 20 mg daily. We will call you tomorrow with your results and discuss your options.

## 2020-09-28 ENCOUNTER — Telehealth: Payer: Self-pay | Admitting: Pharmacist

## 2020-09-28 NOTE — Telephone Encounter (Signed)
LDL is much improved with diet changes. Still above goal of <70. Patient would benefit from plaque stabilization from statins.  Called and discussed with patient starting rosuvastatin. He requested to speak with his wife and call us back.

## 2020-10-07 ENCOUNTER — Other Ambulatory Visit: Payer: Self-pay | Admitting: Neurology

## 2020-10-07 DIAGNOSIS — G20 Parkinson's disease: Secondary | ICD-10-CM

## 2020-10-13 ENCOUNTER — Other Ambulatory Visit: Payer: Self-pay

## 2020-10-13 ENCOUNTER — Encounter: Payer: Self-pay | Admitting: Dietician

## 2020-10-13 ENCOUNTER — Encounter: Payer: Medicare Other | Attending: Family Medicine | Admitting: Dietician

## 2020-10-13 DIAGNOSIS — I2510 Atherosclerotic heart disease of native coronary artery without angina pectoris: Secondary | ICD-10-CM | POA: Insufficient documentation

## 2020-10-13 DIAGNOSIS — I4891 Unspecified atrial fibrillation: Secondary | ICD-10-CM | POA: Diagnosis not present

## 2020-10-13 DIAGNOSIS — I10 Essential (primary) hypertension: Secondary | ICD-10-CM | POA: Insufficient documentation

## 2020-10-13 DIAGNOSIS — G20 Parkinson's disease: Secondary | ICD-10-CM | POA: Insufficient documentation

## 2020-10-13 DIAGNOSIS — E7800 Pure hypercholesterolemia, unspecified: Secondary | ICD-10-CM | POA: Insufficient documentation

## 2020-10-13 NOTE — Progress Notes (Signed)
Medical Nutrition Therapy  Appointment Start time:  0900  Appointment End time:  1000  Primary concerns today: High cholesterol/Losing weight  Referral diagnosis: E78.00 Pure Hypercholesterolemia Preferred learning style: No preference indicated Learning readiness: Change in progress   NUTRITION ASSESSMENT   Anthropometrics  Ht: 5'10" Wt: 143.5 (Self-Reported) UBW: 165 lbs  Wt Loss: 12% in 3 months. Body mass index is 20.59 kg/m.   Clinical Medical Hx: HTN, HLD, CAD, Afib, Parkinson's Medications: Sinemet, Eliquis Labs: TC - 213, LDL - 85 (Goal of <70) Notable Signs/Symptoms: weight loss  Lifestyle & Dietary Hx Conducted as a telephone visit. Verified patient's identity with two indicators.  Jack Cobb is present on the call as well. Jack was recently diagnosed with atrial fibrillation, was found to have some scattered plaque, and was assessed as 60th percentile risk for age and gender based on coronary calcium. Jack was also diagnosed with hypercholesterolemia, and LDL of 131.  Jack went on a strict diet and got LDL down to 85.  Changes consisted of no red meat, no baked goods, no eggs. Switched to olive oil instead of butter. Jack was previously having bacon, eggs, and sausage for breakfast every morning. Now usually has oatmeal.  Jack loves fish, catches their own fish to eat. Jack would usually deep fry their fish, but has changed to baking their fish. Jack snacks on peanuts throughout the day. Jack has not seen a change in energy level since making dietary changes. Jack wife is concerned with weight loss as a result of this diet. Jack weight upon diagnosis was 162 on 06/28/2020, Jack weight is currently 143.5 as of 10/12/2020. Jack wife reports their doctor wants to put patient on a statin, and Jack does not want to start it. Jack has a large garden that they tend to to get regular physical activity. Jack reports mild joint pain that has been improved since starting a CoQ10 supplement.   Estimated  daily fluid intake: ~96 oz Supplements: CoQ10 Sleep: Poor quality due to shoulder pain, 4-5 hours a night Stress / self-care: Low stress Current average weekly physical activity: ADLs, yard work not sedentary but unable to exercise due to Parkinson's  24-Hr Dietary Recall First Meal: Oatmeal, 2 boiled eggs, cup of black coffee Snack: Piece of toast with apple butter and cinnamon Second Meal: [place] sandwich with lettuce, tomato, mayonnaise and american cheese, sweet tea Snack: PB crackers and coca cola Third Meal: Baked chicken, pinto beans, green beans and potatoes, water Snack: Bowl of strawberry ice cream w/canned peaches and juice Beverages: water   NUTRITION DIAGNOSIS  NI-1.4 Inadequate energy intake As related to  unintended weight loss.  As evidenced by 12% loss of body weight in 3 months, self-reported dietary restrictions resulting from hypercholesterolemia diagnosis, and no prior nutrition education.   NUTRITION INTERVENTION  Nutrition education (E-1) on the following topics:  . Educate Jack on the difference between LDL and HDL cholesterol. Educate Jack on the factors that can increase and decrease HDL cholesterol. Including exercise to increase HDL, and tobacco use to decrease HDL. Educate Jack on factors that can elevate LDL cholesterol, including high dietary intake of saturated fats. Educate Jack on identifying sources of saturated fats, and how to make alternative food choices to lower saturated fat intake. Educate Jack on the role of soluble fiber in binding to cholesterol in the GI tract an eliminating it from the body. Educate Jack on dietary sources of soluble fiber. Educate on the role of elevated LDL on  cardiovascular health. Educate Jack on the role of physical activity in lowering LDL and increasing HDL cholesterol. . Advise Jack to increase calories via sources of healthy fats and lean proteins to promote weight gain. Recommend Jack achieve a healthy body weight of 155-165  lbs.  Handouts Provided Include   Cholesterol Lowering Nutrition Therapy Nutrition Care Manual  Learning Style & Readiness for Change Teaching method utilized: Visual & Auditory  Demonstrated degree of understanding via: Teach Back  Barriers to learning/adherence to lifestyle change: None identified  Goals Established by Jack  Move to 1% milk with your cereal.  Begin using your air fryer to cook your fish. Make sure to spray your foods with a little olive oil spray   Add some fruit and nuts or peanut butter to your oatmeal in the morning.  Continue to be active throughout the day.  Add some oils or oil based dressing to your foods.  Add a Boost or Ensure high calorie/protein drink each day!   MONITORING & EVALUATION Dietary intake, weekly physical activity, LDL, and body weight PRN.  Next Steps  Patient is to schedule a follow-up as needed.

## 2020-10-13 NOTE — Patient Instructions (Addendum)
Move to 1% milk with your cereal.  Begin using your air fryer to cook your fish. Make sure to spray your foods with a little olive oil spray   Add some fruit and nuts or peanut butter to your oatmeal in the morning.  Continue to be active throughout the day.  Add some oils or oil based dressing to your foods.  Add a Boost or Ensure high calorie/protein drink each day!

## 2020-10-20 ENCOUNTER — Other Ambulatory Visit: Payer: Medicare Other

## 2020-10-27 ENCOUNTER — Other Ambulatory Visit: Payer: Self-pay | Admitting: Neurology

## 2020-10-27 DIAGNOSIS — G20 Parkinson's disease: Secondary | ICD-10-CM

## 2020-11-02 ENCOUNTER — Ambulatory Visit: Payer: Medicare Other | Admitting: Neurology

## 2020-11-02 ENCOUNTER — Encounter: Payer: Self-pay | Admitting: Neurology

## 2020-11-02 VITALS — BP 123/67 | HR 54 | Ht 70.0 in | Wt 143.3 lb

## 2020-11-02 DIAGNOSIS — G479 Sleep disorder, unspecified: Secondary | ICD-10-CM

## 2020-11-02 DIAGNOSIS — I4891 Unspecified atrial fibrillation: Secondary | ICD-10-CM

## 2020-11-02 DIAGNOSIS — G20 Parkinson's disease: Secondary | ICD-10-CM | POA: Diagnosis not present

## 2020-11-02 MED ORDER — CARBIDOPA-LEVODOPA ER 25-100 MG PO TBCR: 1.0000 | EXTENDED_RELEASE_TABLET | Freq: Two times a day (BID) | ORAL | 5 refills | Status: DC

## 2020-11-02 NOTE — Patient Instructions (Signed)
  You can try Melatonin at night for sleep: take 3 mg at bedtime. You can increase to 6 mg if needed. It is over the counter and comes in pill form, chewable form and spray, if you prefer.    We will add Sinemet CR at bedtime, which is a long acting formulation of levodopa.  I have prescribed the 25-100 mg ER dose, you can take a second dose around 1:30 PM in addition to your regular levodopa which is 1 pill 6 times a day.   Please continue all your other medications.

## 2020-11-02 NOTE — Progress Notes (Signed)
Subjective:    Patient ID: Jack Cobb is a 70 y.o. male.  HPI    Interim history:   Jack Cobb is a 70 year old right-handed gentleman, with an underlying medical history of hypertension, HLP, PAF, CAD, s/p hernia repair in 2012, and s/p low back surgery in 2019, who presents for follow-up consultation of his right-sided predominant Parkinson's disease which was diagnosed in May 2007.  He is accompanied by his wife today.  I last saw him on 05/04/2020, at which time he reported not trying the Sinemet CR as we had talked about before.  He was encouraged to try Sinemet CR at bedtime and keep his regular levodopa the same.  He was advised to follow-up routinely in 6 months, he was considering the COVID-vaccine.  Today, 11/02/2020: He reports not doing as well as his medication is more erratic in terms of how long it lasts and how well it kicks in.  It helps to move after taking a dose, it also sometimes helps to eat a snack after he takes the medication.  He does not sleep very well, does not sleep more than 4 to 5 hours.  He does not have trouble going to sleep. He was diagnosed with new onset A. fib in January 2022.  He recently saw Dr. Radford Pax in cardiology in April 2022.  His A. fib was sudden, without obvious trigger, he was watching TV with his wife when he started having palpitations.  His home blood pressure monitor showed a heart rate in the 180s.  Cardioversion was discussed in the emergency room but then he was admitted for IV Cardizem.  Echocardiogram from 06/13/2020 showed an EF of 60 to 65%.  Lipid panel in February 2022 showed a total cholesterol of 213 and LDL of 131.  A statin was recommended but he wanted to work on lifestyle modification.  He has changed his dietary habits and has lost weight.  Latest LDL from 09/27/2020 was 85.  He has not fallen.  He has occasional constipation, does not take a laxative.   The patient's allergies, current medications, family history, past medical  history, past social history, past surgical history and problem list were reviewed and updated as appropriate.    Previously (copied from previous notes for reference):    I saw him on 10/07/2019, at which time he felt more or less stable, had filled the Sinemet CR to be taken at bedtime but did not actually take it.  I did encourage him to started and we mutually agreed to keep his Sinemet, Mirapex and Azilect the same.     I saw him on 04/09/2019, at which time he was taking Sinemet 6 times a day about 2-1/2 hourly, reported more difficulty getting started in the morning, more slowness.  He was advised to add Sinemet CR at bedtime.    I saw him in a virtual visit on 10/02/2018, at which time he reported feeling fairly stable.  He had some intermittent issues with constipation and his balance and mobility.  He was advised to continue with Sinemet 1 pill 6 times a day and Mirapex 1 pill 3 times a day as well as Azilect 1 pill once daily.    I saw him on 04/03/2018, at which time he reported doing well, he had had lumbar spine surgery under Dr. Trenton Gammon in July 2019 and had done well after it.  He was not on any pain medication.  He was trying to stay active.  Sinemet was  1 pill 5 or 6 times a day, more consistently 5 times a day.  He was taking Mirapex twice daily 1 mg strength and sometimes an additional half pill during the middle of the day.  He had tried hemp for back pain which he found helpful, he was wondering about the usefulness of CBD oil.  I suggested we keep his medication regimen the same, including Azilect 1 mg once daily in the morning, Mirapex and Sinemet.  We talked about potentially doing speech therapy evaluation.       I saw him on 10/01/2017, at which time he reported doing okay from a Parkinson's standpoint, however, he started having more problems with shooting pains to the right leg and had an L spine MRI in December 2018. He started seeing a spine specialist. He had received  injections which provided temporary relief.    I saw him on 04/02/2017, at which time he had issues with constipation, no cognitive issues, no recent falls, he did report that he may have pulled a muscle in the right hip area. We mutually agreed to continue his current medication regimen of Azilect once daily, Mirapex ER once daily and Sinemet 1 pill 5 times a day.   He called in the interim reporting that the Mirapex ER was to expensive and we switched him over to Mirapex IR 1 mg 3 times a day.   I saw him on 09/27/2016, at which time he reported doing okay but felt like his levodopa was wearing off after 3 hours. He was taking half a pill in the morning, 1 pill midday, then another full pill about 3-4 hours later and half a pill in the evening. Is not exercising on a regular basis but overall was fairly active. He had some constipation intermittently. Memory was stable, some forgetfulness reported, some mood irritability was reported by his girlfriend. He was getting his Mirapex ER brand-name from [place]. He was taking Azilect generic once daily. I suggested we continue with Mirapex ER 3 mg daily and Azilect 1 mg daily. I suggested we increase his Sinemet to 1 pill 5 times a day. I asked him to exercise more regularly and be proactive about constipation issues.   Of note, the patient no showed for appointment on 07/12/2016 secondary to illness. I saw him on 01/10/2016, at which time he reported worsening of his posture in more stiffness. He had no significant problems with balance or constipation or mood. He was taking overall a low dose of Sinemet. I suggested he could continue with the current regimen but increase his second dose also to a whole pill. He was taking half a pill for 3 doses and 1 whole pill at 3 PM. I suggested he could try to increase his Sinemet. Overall, he felt fairly stable but I did notice some changes on exam as well.   I saw him on 07/12/2015, at which time he reported feeling  stable. He did not notice much in the way of difference after the increase in the Mirapex long-acting. Overall, he was less stressed. He was able to transition his mother took class nursing home and she did quite well given her age of 46. He was sleeping a little better not having to worry about his mother on the time. He was taking Sinemet half a pill 4 times a day sometimes a whole pill at 3 PM, sometimes would get only 3 doses and for the day. He is trying to stay active and trying to  do better with his water intake as opposed to coffee and soda. I suggested he could consider alternating 1 pill with half a pill for a total of 3 pills daily, but he preferred to stay on the regimen of about 2 and half pills daily.   I saw him on 01/06/2015, at which time he reported doing fairly well overall. He was active. He had some increased stiffness particularly in the mornings and later in the evenings. He would sometimes forget his evening dose of Sinemet. He still had some reservations about taking Sinemet because of fear of potential long-term effects and side effects. We mutually agreed to keep his Sinemet at 3 half pills a day for 3 doses, and one whole pill around 3 PM. He was advised to continue with Azilect once daily as well. I suggested he increase his Mirapex ER from 2.25 mg to 3 mg once daily. He was on brand-name Mirapex ER.   I saw him on 07/08/2014, at which time he reported more tremors, especially towards the end of the day. He reported more fatigue, and stiffness and slowness after 3 PM. He was taking Azilect once daily and brand-name Mirapex long-acting. He was taking Sinemet half a pill 3 times a day. He continued to be very active and take care of his elderly mother, 67 years old, alternating her care with his brother. I suggested he continue with Azilect once daily and Mirapex ER but he increase Sinemet to half a pill at 7 AM, half a pill at 11 AM, 1 whole pill at 3 PM and half a pill at 7 PM.   I  saw him on 01/05/2014, at which time he reported feeling worse with his posture and his walking was slower. He felt stable with his mood and memory. He had mild constipation. He continued to work here and there. He reported no issues with driving. His mother continue to stay with him every other week and he was sharing care of her with his brother. His mother has dementia and is in her mid 34s. I suggested he continue with Azilect and Mirapex ER at the same doses but and a half pill of levodopa to his regimen.   I saw him on 06/16/2013, at which time he reported feeling stable, with the exception of occasional start hesitation and trouble turning. I encouraged him to increase his exercise to more daily walking exercises and kept his medications the same.   I first met him on 12/12/2012, at which time I felt that his exam was stable. I did not make any medication changes at the time but suggested he consider taking coenzyme Q 10.     He previously followed by Dr. Morene Antu and was last seen by him on 06/14/2012, and which time Dr. Erling Cruz felt that he was doing very well. He did not make any changes to his medication regimen.   He was diagnosed with idiopathic right-sided predominant Parkinson's disease in May 2007 and initially treated with long-acting Mirapex, rasagiline and levodopa. He exercises regularly. He has had some daytime somnolence. He snores at night. In February 2013 he went on disability. He denied compulsive behavior, orthostatic hypotension or edema. He has had some difficulty with his memory. His mother has dementia at age 41. He lives alone, and takes care of his mother, who stays with him one week and with his only brother one week. He stays active and tends to sleep better when his mother stays with his brother  as he can rest more and relax more. He has had no depression, anxiety, hallucinations, delusions or major memory problems, or dyskinesias.  He had tried the Sinemet CR for about 7 to  10 days but felt jittery afterwards, has not taken it since January 2022. He continues to take Azilect once daily, Mirapex 3 times a day and Sinemet IR 1 pill 6 times a day, stopping around 6 AM every 2-1/2 hours.  Bedtime is generally between 9 and 11 PM.  His Past Medical History Is Significant For: Past Medical History:  Diagnosis Date   CAD (coronary artery disease), native coronary artery 09/06/2020   Coronary CTA was done showing a coronary calcium score of 201 placing him at the 60th percentile for age and gender.  He was noted to have 75 to 99% stenosis of the mid LAD with scattered nonobstructive plaquing elsewhere.  FFR 0.7 in D1 and 0.73 at the apex>>medical management   HLD (hyperlipidemia) 09/06/2020   Hypertension    Parkinson disease ([place])     Her Past Surgical History Is Significant For: Past Surgical History:  Procedure Laterality Date   HERNIA REPAIR      His Family History Is Significant For: Family History  Problem Relation Age of Onset   Unexplained death Mother 36       was on coumadin, reason not known   Heart attack Father 47   Heart disease Brother        had 2 stents in his 22s    His Social History Is Significant For: Social History   Socioeconomic History   Marital status: Married    Spouse name: Not on file   Number of children: 3   Years of education: 12th   Highest education level: Not on file  Occupational History   Occupation: Retired from tile work    Comment: retired  Tobacco Use   Smoking status: Never   Smokeless tobacco: Former    Types: Chew   Tobacco comments:    quit 8 years ago  Substance and Sexual Activity   Alcohol use: No   Drug use: No   Sexual activity: Not on file  Other Topics Concern   Not on file  Social History Narrative   Patient is right handed, resides alone, mother resides with him 1/2 the time,has dementia   Social Determinants of Health   Financial Resource Strain: Not on file  Food Insecurity: Not on  file  Transportation Needs: Not on file  Physical Activity: Not on file  Stress: Not on file  Social Connections: Not on file    His Allergies Are:  No Known Allergies:   His Current Medications Are:  Outpatient Encounter Medications as of 11/02/2020  Medication Sig   apixaban (ELIQUIS) 5 MG TABS tablet Take 1 tablet (5 mg total) by mouth 2 (two) times daily.   carbidopa-levodopa (SINEMET IR) 25-100 MG tablet TAKE 1 TABLET BY MOUTH 6 TIMES DAILY   diltiazem (CARDIZEM CD) 180 MG 24 hr capsule Take 1 capsule (180 mg total) by mouth daily.   pramipexole (MIRAPEX) 1 MG tablet TAKE 1 TABLET BY MOUTH 3 TIMES DAILY   rasagiline (AZILECT) 1 MG TABS tablet TAKE 1 TABLET BY MOUTH DAILY   No facility-administered encounter medications on file as of 11/02/2020.  :  Review of Systems:  Out of a complete 14 point review of systems, all are reviewed and negative with the exception of these symptoms as listed below:  Review of Systems  Neurological:        Here for f/u on P/D. Reports he has not been doing well since last visit. Reports once his medication wears off he struggles. Denies any falls.    Objective:  Neurological Exam  Physical Exam Physical Examination:   Vitals:   11/02/20 1057  BP: 123/67  Pulse: (!) 54    General Examination: The patient is a very pleasant 70 y.o. male in no acute distress. He appears well-developed and well-nourished and well groomed.   HEENT: Normocephalic, atraumatic, pupils are equal, round and reactive to light, extraocular tracking shows, mild saccadic breakdown without nystagmus noted. There is limitation to upper gaze. There is mild decrease in eye blink rate. Hearing is intact. Face is symmetric with mild facial masking. Neck is mildly rigid, slight left head tilt, stable. Oropharynx exam reveals mild mouth dryness. No significant airway crowding is noted. Mallampati is class II. Tongue protrudes centrally and palate elevates symmetrically. There is  no drooling. Speech mild to moderately hypophonic, slightly hoarse sounding at times.  No dysarthria.   Chest: is clear to auscultation without wheezing, rhonchi or crackles noted.   Heart: sounds are regular and normal without murmurs, rubs or gallops noted.   Abdomen: is soft, non-tender and non-distended.    Extremities: There is no pitting edema in the distal lower extremities bilaterally.   Skin: is warm and dry with no trophic changes noted.     Musculoskeletal: exam reveals no obvious joint deformities, tenderness, joint swelling or erythema.   Neurologically: Mental status: The patient is awake and alert, paying good  attention. He is able to provide his history but his wife provides details.  Memory is generally well-preserved.  Speech is hypophonic with no dysarthria noted. Mood is congruent and affect is normal.     Cranial nerves are as described above under HEENT exam. In addition, shoulder shrug is normal but L shoulder is slightly lower than right. .   Motor exam: Thin bulk, global strength normal for age is noted. There are mild generalized dyskinesias on exam today.      Tone is mildly rigid with presence of cogwheeling in the right upper extremity. There is overall mild to moderate bradykinesia. There is no obvious resting tremor.     Fine motor skills exam: Fine motor skills are mild to moderately impaired on the right and slightly better on the left, fairly stable.   Cerebellar testing shows no dysmetria or intention tremor on finger to nose testing. Heel to shin is unremarkable bilaterally. There is no truncal or gait ataxia.   Sensory exam is intact to light touch in the UEs and LEs.   Gait, station and balance: He stands up from the seated position with minimal difficulty and does not need to push up with His hands. He needs no assistance. No veering to one side is noted. He is not noted to lean to the side. Posture is moderately stooped for age, and appears a  little worse than last time.  He walks with fairly good pace, decreased stride length and decreased arm swing on the right, balance is fairly well-preserved. He turns well today.     Assessment and Plan:    In summary, JAAZIEL PEATROSS is a very pleasant 70 year old male with an underlying medical history of hypertension and chest pain, status post hernia repair in 2012, coronary artery disease and recent diagnosis of atrial fibrillation, who presents for followup consultation of his right-sided predominant Parkinson's disease,  akinetic-rigid type, with diagnosis dating back to 2007, symptoms perhaps dating back to 2006. With time, he has had mild progression, but had remained stable for years in the past. We have with time increased his Mirapex and also his Sinemet over time.  He is currently on Mirapex 1 mg 3 times daily and Sinemet 25-100 mg strength 1 pill 6 times a day, approximately every 2-1/2 hours, starting at 6 AM.  He has had instances of wearing off and not feeling that the medication has continued well enough.  He has had some mild dyskinesias over time. He has had some progression in his posture and stiffness with time. He has had issues with lower back and sciatica type symptoms, s/p L spine surgery in 11/2017.  He has not been sleeping very well, has no difficulty falling asleep but has difficulty maintaining sleep.  He tried Sinemet CR 50-200 mg strength for few days but did not notice much in the way of benefit.  He would be willing to try a smaller dose.  To that end, I suggested he continue with Azilect once daily, Mirapex 3 times a day and Sinemet IR 1 pill 6 times a day.  In addition, he is advised to start Sinemet CR 25-100 mg strength 1 pill at bedtime and a potential second dose at 1:30 PM daily.  He is advised to try melatonin at night for sleep, starting with 3 mg at bedtime, increasing to 6 mg if needed.  He is on diltiazem and Eliquis through cardiology.  We talked about the  importance of healthy lifestyle, he has lost some weight, he is encouraged to continue to stay active and monitor for constipation issues.  His LDL has come down from 131 in February to 33 recently last month. He is advised to follow-up routinely in 3 to 4 months in this clinic, sooner if needed.  I answered all their questions today and the patient and his wife are in agreement.

## 2021-01-11 ENCOUNTER — Other Ambulatory Visit: Payer: Self-pay | Admitting: Cardiovascular Disease

## 2021-01-11 NOTE — Telephone Encounter (Signed)
Age 70, weight 65kg, SCr 1.09 on 06/12/20 Afib indication, LOV 4/22

## 2021-02-27 ENCOUNTER — Telehealth: Payer: Self-pay | Admitting: Neurology

## 2021-02-27 NOTE — Telephone Encounter (Signed)
His wife called that Marlon had HA and  URI symptoms and tested positive for Covid-19.   His PCP is unavailable this week.   I advised her to have him get Paxlovid -- pharmacists can now prescribe,  If symptoms worsen he could go to UC or ED.

## 2021-02-28 ENCOUNTER — Ambulatory Visit: Payer: Medicare Other | Admitting: Neurology

## 2021-02-28 NOTE — Telephone Encounter (Signed)
Noted, we will cancel today's appointment.

## 2021-03-01 NOTE — Telephone Encounter (Signed)
If he has ongoing symptoms with COVID, he needs to go to urgent care or emergency room depending on the symptoms. It sounds like he is not having any severe symptoms, he can consider going to an urgent care.

## 2021-03-01 NOTE — Telephone Encounter (Signed)
Pt "s wife, Marshon Bangs (on [place]) Pharmacist said Paxlovid do not carry that medication and pharmacist cannot prescribe. He can not take the Paxlovid because of the heart medication he is on.  Want to know if there is something that can be prescribed. Would like a call from the nurse to discuss what to do.

## 2021-03-01 NOTE — Telephone Encounter (Signed)
I returned Karen's call.  She stated patient's primary care office has closed down.  She stated the pharmacist was unable to get the Paxlovid and cannot prescribe.  She said in the meantime she has read about the drug and does not think patient could take this due to his medication.  He is on a blood thinner and an additional heart rate controlling medication.  She wanted to know if there was anything else she should be doing for the patient.  She stated the patient is up, moving around.  He is doing great overall.  He has mild symptoms such as cough and intermittent fever.  She is giving him Tylenol every 8 hours and Airborne.  I did advise that a message would be sent to Dr. Frances Furbish, however the wife is aware that Dr Frances Furbish does not treat COVID.  I recommended that he ambulate every hour while awake, take deep breaths, and do not hold the coughs in, cough into a sleeve or tissue and follow up with good hand hygiene.  If symptoms get any worse please proceed to urgent care for evaluation.  She verbalized understanding and appreciation.

## 2021-03-01 NOTE — Telephone Encounter (Signed)
Spoke with wife, Clydie Braun, and relayed information from Dr Frances Furbish. She verbalized appreciation and understanding.

## 2021-03-21 ENCOUNTER — Other Ambulatory Visit: Payer: Self-pay | Admitting: Neurology

## 2021-03-21 DIAGNOSIS — G20 Parkinson's disease: Secondary | ICD-10-CM

## 2021-05-12 ENCOUNTER — Ambulatory Visit: Payer: Medicare Other | Admitting: Neurology

## 2021-05-31 ENCOUNTER — Other Ambulatory Visit: Payer: Medicare Other | Admitting: *Deleted

## 2021-05-31 ENCOUNTER — Encounter: Payer: Self-pay | Admitting: Cardiology

## 2021-05-31 ENCOUNTER — Telehealth (INDEPENDENT_AMBULATORY_CARE_PROVIDER_SITE_OTHER): Payer: Medicare Other | Admitting: Cardiology

## 2021-05-31 ENCOUNTER — Other Ambulatory Visit: Payer: Self-pay

## 2021-05-31 VITALS — BP 131/79 | HR 74 | Ht 70.0 in | Wt 140.0 lb

## 2021-05-31 DIAGNOSIS — E7800 Pure hypercholesterolemia, unspecified: Secondary | ICD-10-CM | POA: Diagnosis not present

## 2021-05-31 DIAGNOSIS — I2510 Atherosclerotic heart disease of native coronary artery without angina pectoris: Secondary | ICD-10-CM

## 2021-05-31 DIAGNOSIS — I10 Essential (primary) hypertension: Secondary | ICD-10-CM

## 2021-05-31 DIAGNOSIS — I2584 Coronary atherosclerosis due to calcified coronary lesion: Secondary | ICD-10-CM

## 2021-05-31 DIAGNOSIS — I480 Paroxysmal atrial fibrillation: Secondary | ICD-10-CM | POA: Diagnosis not present

## 2021-05-31 LAB — LIPID PANEL
Chol/HDL Ratio: 2.7 ratio (ref 0.0–5.0)
Cholesterol, Total: 184 mg/dL (ref 100–199)
HDL: 67 mg/dL (ref >39)
LDL Chol Calc (NIH): 102 mg/dL — ABNORMAL HIGH (ref 0–99)
Triglycerides: 82 mg/dL (ref 0–149)
VLDL Cholesterol Cal: 15 mg/dL (ref 5–40)

## 2021-05-31 NOTE — Patient Instructions (Signed)
Medication Instructions:  Your physician recommends that you continue on your current medications as directed. Please refer to the Current Medication list given to you today.  *If you need a refill on your cardiac medications before your next appointment, please call your pharmacy*   Lab Work: NONE   If you have labs (blood work) drawn today and your tests are completely normal, you will receive your results only by: [place] (if you have MyChart) OR A paper copy in the mail If you have any lab test that is abnormal or we need to change your treatment, we will call you to review the results.   Testing/Procedures: NONE    Follow-Up: At [facility], you and your health needs are our priority.  As part of our continuing mission to provide you with exceptional heart care, we have created designated Provider Care Teams.  These Care Teams include your primary Cardiologist (physician) and Advanced Practice Providers (APPs -  Physician Assistants and Nurse Practitioners) who all work together to provide you with the care you need, when you need it.  We recommend signing up for the patient portal called "MyChart".  Sign up information is provided on this After Visit Summary.  MyChart is used to connect with patients for Virtual Visits (Telemedicine).  Patients are able to view lab/test results, encounter notes, upcoming appointments, etc.  Non-urgent messages can be sent to your provider as well.   To learn more about what you can do with MyChart, go to [email or web address].    Your next appointment:   6 month(s)  The format for your next appointment:   In Person  Provider:   Fransico Him, MD     Other Instructions Thank you for choosing [facility]!

## 2021-05-31 NOTE — Progress Notes (Signed)
Virtual Visit via Video Note   This visit type was conducted due to national recommendations for restrictions regarding the COVID-19 Pandemic (e.g. social distancing) in an effort to limit this patient's exposure and mitigate transmission in our community.  Due to his co-morbid illnesses, this patient is at least at moderate risk for complications without adequate follow up.  This format is felt to be most appropriate for this patient at this time.  All issues noted in this document were discussed and addressed.  A limited physical exam was performed with this format.  The visit had to be converted to Telephone encounter along with video as the video sound was not workingPlease refer to the patient's chart for his consent to telehealth for [facility].  Date:  05/31/2021   ID:  Jack Cobb, DOB 1950/08/10, MRN [id number] The patient was identified using 2 identifiers.  Patient Location: Home Provider Location: Home Office   PCP:  Aida Puffer, MD   [facility] Providers Cardiologist:  Armanda Magic, MD     Evaluation Performed:  Follow-Up Visit  Chief Complaint:  CAD, HLD  History of Present Illness:    Jack Cobb is a 71 y.o. male with a hx of ASCAD, PAF, HTN and Parkinson's disease.  He was hospitalized in Jan 2022 with afib with RVR and converted spontaneously on Cardizem to NSR. hsTrop was elevated and coronary CTA was done showing a coronary calcium score of 201 placing him at the 60th percentile for age and gender.  He was noted to have 75 to 99% stenosis of the mid LAD with scattered nonobstructive plaquing elsewhere.     He was seen by Dr. Excell Seltzer for discussion of possible Cardiac catheterization with possible LAD PCI. The patient's coronary CTA was sent for [facility] evaluation.  This showed an FFR of 0.7 in the first diagonal.  The LAD FFR was 0.73 at the apex but was actually normal beyond the area of concern at 0.88 and 0.83. Medical management was recommended given  that the apical LAD FFR has not correlated well with in lab findings at cath and he was not having any chest pain.   He is here today for followup and is doing well.  He denies any chest pain or pressure, SOB, DOE, PND, orthopnea, LE edema, dizziness, palpitations or syncope.  He is compliant with his meds and is tolerating meds with no SE.       The patient does not have symptoms concerning for COVID-19 infection (fever, chills, cough, or new shortness of breath).    Past Medical History:  Diagnosis Date   CAD (coronary artery disease), native coronary artery 09/06/2020   Coronary CTA was done showing a coronary calcium score of 201 placing him at the 60th percentile for age and gender.  He was noted to have 75 to 99% stenosis of the mid LAD with scattered nonobstructive plaquing elsewhere.  FFR 0.7 in D1 and 0.73 at the apex>>medical management   HLD (hyperlipidemia) 09/06/2020   Hypertension    Parkinson disease (HCC)    Past Surgical History:  Procedure Laterality Date   HERNIA REPAIR       Current Meds  Medication Sig   carbidopa-levodopa (SINEMET IR) 25-100 MG tablet TAKE 1 TABLET BY MOUTH 6 TIMES DAILY   diltiazem (CARDIZEM CD) 180 MG 24 hr capsule Take 1 capsule (180 mg total) by mouth daily.   ELIQUIS 5 MG TABS tablet TAKE 1 TABLET BY MOUTH TWICE DAILY  pramipexole (MIRAPEX) 1 MG tablet TAKE 1 TABLET BY MOUTH 3 TIMES DAILY   rasagiline (AZILECT) 1 MG TABS tablet TAKE 1 TABLET BY MOUTH DAILY     Allergies:   Patient has no known allergies.   Social History   Tobacco Use   Smoking status: Never   Smokeless tobacco: Former    Types: Chew   Tobacco comments:    quit 8 years ago  Vaping Use   Vaping Use: Never used  Substance Use Topics   Alcohol use: No   Drug use: No     Family Hx: The patient's family history includes Heart attack (age of onset: 10) in his father; Heart disease in his brother; Unexplained death (age of onset: 31) in his mother.  ROS:   Please  see the history of present illness.     All other systems reviewed and are negative.   Prior CV studies:   The following studies were reviewed today:  none  Labs/Other Tests and Data Reviewed:    EKG:  No ECG reviewed.  Recent Labs: 06/12/2020: BUN 20; Creatinine, Ser 1.00; Hemoglobin 15.3; Magnesium 1.9; Platelets 240; Potassium 3.6; Sodium 142; TSH 2.197 06/29/2020: ALT 7   Recent Lipid Panel Lab Results  Component Value Date/Time   CHOL 213 (H) 06/29/2020 07:59 AM   TRIG 88 06/29/2020 07:59 AM   HDL 66 06/29/2020 07:59 AM   CHOLHDL 3.2 06/29/2020 07:59 AM   LDLCALC 131 (H) 06/29/2020 07:59 AM   LDLDIRECT 85 09/27/2020 08:56 AM    Wt Readings from Last 3 Encounters:  05/31/21 140 lb (63.5 kg)  11/02/20 143 lb 5 oz (65 kg)  10/13/20 143 lb 8 oz (65.1 kg)     Risk Assessment/Calculations:    CHA2DS2-VASc Score = 2  This indicates a 2.2% annual risk of stroke. The patient's score is based upon:     Objective:    Vital Signs:  BP 131/79    Pulse 74    Ht 5\' 10"  (1.778 m)    Wt 140 lb (63.5 kg)    BMI 20.09 kg/m    VITAL SIGNS:  reviewed GEN:  no acute distress EYES:  sclerae anicteric, EOMI - Extraocular Movements Intact RESPIRATORY:  normal respiratory effort, symmetric expansion CARDIOVASCULAR:  no peripheral edema SKIN:  no rash, lesions or ulcers. MUSCULOSKELETAL:  no obvious deformities. NEURO:  alert and oriented x 3, no obvious focal deficit PSYCH:  normal affect  ASSESSMENT & PLAN:    1.  ASCAD -coronary CTA was done showing a coronary calcium score of 201 placing him at the 60th percentile for age and gender.  He was noted to have 75 to 99% stenosis of the mid LAD with scattered nonobstructive plaquing elsewhere.   -given lack of CP and FFR with only distal reduction in flow>>medical management was recommended -He has not had any anginal symptoms since I saw him last -no ASA due to DOAC -Refused statin therapy because of his memory issues with  Parkinson's  2.  HLD -LDL goal is < 70 -he got his blood drawn today for his FLP and ALT -he does not want to start a statin because he has memory issues with his Parkinson's  -He was referred to lipid clinic but did not want to try statins - if LDL still high need to consider PCSK9i or Crestor twice weekly  3.  PAF -He thinks he is in normal rhythm and denies any palpitations -CHADS2VASC score is 3 (HTN, age>65 and  CAD) -He has not had any bleeding problems on DOAC -Continue prescription drug management with apixaban 5 mg twice daily and Cardizem CD 180 mg daily with as needed refills  -check BMET and CBC  4.  HTN -BP is adequately controlled on exam today -Continue prescription drug management with Cardizem CD 180 mg daily with as needed refills   COVID-19 Education: The signs and symptoms of COVID-19 were discussed with the patient and how to seek care for testing (follow up with PCP or arrange E-visit).  The importance of social distancing was discussed today.  Time:   Today, I have spent 20 minutes with the patient with telehealth technology discussing the above problems.     Medication Adjustments/Labs and Tests Ordered: Current medicines are reviewed at length with the patient today.  Concerns regarding medicines are outlined above.   Tests Ordered: No orders of the defined types were placed in this encounter.   Medication Changes: No orders of the defined types were placed in this encounter.   Follow Up:  In Person in 6 month(s)  Signed, Armanda Magicraci Susy Placzek, MD  05/31/2021 1:05 PM    [facility]

## 2021-06-23 ENCOUNTER — Telehealth: Payer: Self-pay | Admitting: *Deleted

## 2021-06-23 DIAGNOSIS — I2510 Atherosclerotic heart disease of native coronary artery without angina pectoris: Secondary | ICD-10-CM

## 2021-06-23 DIAGNOSIS — I480 Paroxysmal atrial fibrillation: Secondary | ICD-10-CM

## 2021-06-23 DIAGNOSIS — Z01818 Encounter for other preprocedural examination: Secondary | ICD-10-CM

## 2021-06-23 NOTE — Telephone Encounter (Signed)
° °  Pre-operative Risk Assessment    Patient Name: Jack Cobb  DOB: 1950-12-08 MRN: [id number]      Request for Surgical Clearance    Procedure:   L4-5 Lumbar laminectomy  Date of Surgery:  Clearance 07/01/21                                 Surgeon:  Dr Julio Sicks Surgeon's Group or Practice Name:  [facility] Phone number:  [phone] Fax number:  [phone] [place]   Type of Clearance Requested:   - Pharmacy:  Hold Apixaban (Eliquis)     Type of Anesthesia:  General    Additional requests/questions:    Veva Holes   06/23/2021, 2:47 PM

## 2021-06-23 NOTE — Telephone Encounter (Signed)
Patient has a history of PAF and CAD seen on coronary CTA.  Previous coronary CT in early 2022 demonstrated 75 to 99% mid LAD lesion, patient was seen by Dr. Excell Seltzer to discuss possible cath, however FFR was actually normal beyond the area of concern, therefore medical therapy was recommended.  Patient was seen a month ago via virtual visit by Dr. Mayford Knife, at which time he was doing well.  Talking with the patient today, he continues to deny any chest pain or worsening dyspnea.  His functional ability is severely limited by the back issue.  I do not think he can accomplish more than 4 METS of activity.  However given lack of chest discomfort, will check with Dr. Mayford Knife to see if he is cleared.  Dr. Mayford Knife, please forward your response to P CV DIV PREOP

## 2021-06-23 NOTE — Telephone Encounter (Signed)
Patient with diagnosis of afib on Eliquis for anticoagulation.    Procedure: L4-5 Lumbar laminectomy Date of procedure: 07/01/21  CHA2DS2-VASc Score = 3  This indicates a 3.2% annual risk of stroke. The patient's score is based upon: CHF History: 0 HTN History: 1 Diabetes History: 0 Stroke History: 0 Vascular Disease History: 1 Age Score: 1 Gender Score: 0   CrCl 77mL/min Platelet count 244K  Per office protocol, patient can hold Eliquis for 3 days prior to procedure.

## 2021-06-23 NOTE — Telephone Encounter (Signed)
Clinical pharmacist to review Eliquis 

## 2021-06-27 ENCOUNTER — Other Ambulatory Visit: Payer: Self-pay | Admitting: Neurology

## 2021-06-27 ENCOUNTER — Encounter: Payer: Self-pay | Admitting: *Deleted

## 2021-06-27 DIAGNOSIS — G20 Parkinson's disease: Secondary | ICD-10-CM

## 2021-06-27 NOTE — Telephone Encounter (Signed)
Pre-op covering staff, please see Dr. Theodosia Blender message below. Per Dr. Radford Pax, he needs a Lexiscan Myoview prior to surgery. Surgery is currently scheduled for 07/01/2021. Can you please call patient and help arrange this? Can you also please notify surgeon's office?  Thank you!

## 2021-06-27 NOTE — Telephone Encounter (Signed)
Pt is scheduled for a stress test, 06/29/21, and clearance will be addressed once that is completed.  Will route back to the requesting surgeon's office to make them aware.

## 2021-06-27 NOTE — Telephone Encounter (Signed)
Pt is aware that Dr. Mayford Knife is requesting him have a stress test prior to giving clearance for upcoming procedure.  Will send pt's instructions via mychart.

## 2021-06-28 ENCOUNTER — Telehealth (HOSPITAL_COMMUNITY): Payer: Self-pay | Admitting: *Deleted

## 2021-06-28 ENCOUNTER — Telehealth: Payer: Self-pay | Admitting: Student

## 2021-06-28 ENCOUNTER — Other Ambulatory Visit (HOSPITAL_BASED_OUTPATIENT_CLINIC_OR_DEPARTMENT_OTHER): Payer: Self-pay | Admitting: *Deleted

## 2021-06-28 DIAGNOSIS — Z01818 Encounter for other preprocedural examination: Secondary | ICD-10-CM

## 2021-06-28 NOTE — Telephone Encounter (Signed)
Patient given detailed instructions per Myocardial Perfusion Study Information Sheet for the test on 06/29/21 Patient notified to arrive 15 minutes early and that it is imperative to arrive on time for appointment to keep from having the test rescheduled.  If you need to cancel or reschedule your appointment, please call the office within 24 hours of your appointment. . Patient verbalized understanding.Jack Cobb

## 2021-06-28 NOTE — Telephone Encounter (Signed)
° °  Called and consented patient for [facility] which was ordered for further pre-op evaluation at the request of Dr. Mayford Knife.  Shared Decision Making/Informed Consent{ The risks [chest pain, shortness of breath, cardiac arrhythmias, dizziness, blood pressure fluctuations, myocardial infarction, stroke/transient ischemic attack, nausea, vomiting, allergic reaction, radiation exposure, metallic taste sensation and life-threatening complications (estimated to be 1 in 10,000)], benefits (risk stratification, diagnosing coronary artery disease, treatment guidance) and alternatives of a nuclear stress test were discussed in detail with Jack Cobb and he agrees to proceed.  Corrin Parker, PA-C 06/28/2021 4:37 PM

## 2021-06-29 ENCOUNTER — Other Ambulatory Visit: Payer: Self-pay

## 2021-06-29 ENCOUNTER — Ambulatory Visit (HOSPITAL_COMMUNITY): Payer: Medicare Other | Attending: Internal Medicine

## 2021-06-29 DIAGNOSIS — Z01818 Encounter for other preprocedural examination: Secondary | ICD-10-CM | POA: Insufficient documentation

## 2021-06-29 DIAGNOSIS — I480 Paroxysmal atrial fibrillation: Secondary | ICD-10-CM | POA: Insufficient documentation

## 2021-06-29 DIAGNOSIS — Z01810 Encounter for preprocedural cardiovascular examination: Secondary | ICD-10-CM

## 2021-06-29 DIAGNOSIS — I2510 Atherosclerotic heart disease of native coronary artery without angina pectoris: Secondary | ICD-10-CM | POA: Diagnosis not present

## 2021-06-29 LAB — MYOCARDIAL PERFUSION IMAGING
LV dias vol: 143 mL (ref 62–150)
LV sys vol: 59 mL
Nuc Stress EF: 59 %
Peak HR: 78 {beats}/min
Rest HR: 51 {beats}/min
Rest Nuclear Isotope Dose: 10.7 mCi
SDS: 3
SRS: 1
SSS: 4
ST Depression (mm): 0 mm
Stress Nuclear Isotope Dose: 32.5 mCi
TID: 1.06

## 2021-06-29 MED ORDER — TECHNETIUM TC 99M TETROFOSMIN IV KIT
10.7000 | PACK | Freq: Once | INTRAVENOUS | Status: AC | PRN
  Administered 2021-06-29: 10.7 via INTRAVENOUS
  Filled 2021-06-29: qty 11

## 2021-06-29 MED ORDER — TECHNETIUM TC 99M TETROFOSMIN IV KIT
32.5000 | PACK | Freq: Once | INTRAVENOUS | Status: AC | PRN
  Administered 2021-06-29: 32.5 via INTRAVENOUS
  Filled 2021-06-29: qty 33

## 2021-06-29 MED ORDER — REGADENOSON 0.4 MG/5ML IV SOLN
0.4000 mg | Freq: Once | INTRAVENOUS | Status: AC
  Administered 2021-06-29: 0.4 mg via INTRAVENOUS

## 2021-06-30 NOTE — Telephone Encounter (Signed)
Call back pool please contact the patient and inform him that his stress test revealed low risk and he is cleared for his surgery. He will be holding Eliquis for three day prior to procedure.

## 2021-06-30 NOTE — Telephone Encounter (Signed)
I s/w the pt and he has been informed per notes from pre op provider today Robin Searing, NP that stress is low risk and he has been cleared for surgery. Pt has already began holding his Eliquis as of 06/28/21, last dose taken was on 06/27/21. I will fax notes to Dr. Jordan Likes.

## 2021-06-30 NOTE — Telephone Encounter (Signed)
° ° °  Patient Name: Jack Cobb  DOB: February 16, 1951 MRN: [id number]  Primary Cardiologist: Armanda Magic, MD  Chart reviewed as part of pre-operative protocol coverage. Given past medical history and time since last visit, based on ACC/AHA guidelines, SUVAN STCYR would be at acceptable risk for the planned procedure without further cardiovascular testing.   As a recommendation of Dr. Mayford Knife, patient underwent nuclear stress test on 2/8.  Stress test results were low risk without significant ischemia.Therefore patient is cleared for procedure.  Per our clinical pharmacist Eliquis will be held for three days prior to procedure and restarted as soon as possible afterwards at surgeons discretion.  The patient was advised that if he develops new symptoms prior to surgery to contact our office to arrange for a follow-up visit, and he verbalized understanding.  I will route this recommendation to the requesting party via Epic fax function and remove from pre-op pool.  Please call with questions.  Napoleon Form, Leodis Rains, NP 06/30/2021, 9:00 AM

## 2021-07-12 ENCOUNTER — Other Ambulatory Visit: Payer: Self-pay | Admitting: Cardiovascular Disease

## 2021-07-21 ENCOUNTER — Ambulatory Visit: Payer: Medicare Other | Admitting: Neurology

## 2021-07-21 ENCOUNTER — Encounter: Payer: Self-pay | Admitting: Neurology

## 2021-07-21 ENCOUNTER — Other Ambulatory Visit: Payer: Self-pay

## 2021-07-21 VITALS — BP 129/75 | HR 58 | Ht 70.0 in | Wt 157.4 lb

## 2021-07-21 DIAGNOSIS — G249 Dystonia, unspecified: Secondary | ICD-10-CM

## 2021-07-21 DIAGNOSIS — G479 Sleep disorder, unspecified: Secondary | ICD-10-CM

## 2021-07-21 DIAGNOSIS — G20 Parkinson's disease: Secondary | ICD-10-CM | POA: Diagnosis not present

## 2021-07-21 DIAGNOSIS — K5900 Constipation, unspecified: Secondary | ICD-10-CM

## 2021-07-21 MED ORDER — CARBIDOPA-LEVODOPA 25-100 MG PO TABS: ORAL_TABLET | ORAL | 3 refills | Status: DC

## 2021-07-21 NOTE — Progress Notes (Signed)
Subjective:    Patient ID: Jack Cobb is a 71 y.o. male.  HPI    Interim history:   Jack Cobb is a 71 year old right-handed gentleman, with an underlying medical history of hypertension, HLP, PAF, CAD, s/p hernia repair in 2012, and s/p low back surgery in 2019, who presents for follow-up consultation of his right-sided predominant Parkinson's disease which was diagnosed in May 2007.  He is accompanied by his wife today.  I last saw him on 11/02/2020, at which time he felt less stable on his medication for Parkinson's disease.  He was followed by cardiology for his new onset atrial fibrillation.  He was diagnosed in January 2022.  He was advised to add Sinemet CR up to twice daily and continue with his Sinemet IR.  He was also advised to continue with Azilect and Mirapex at the current doses.  He had started diltiazem and Eliquis through cardiology.  He was advised to try melatonin at night for sleep.  His wife called in the interim in October 2022 reporting that patient had COVID symptoms and tested positive for COVID-19.  He was advised to proceed to urgent care or emergency room for acute management of his COVID.  Today, 07/21/2021: He reports fairly stable. His wife has noted more involuntary movements around his face and of the legs.  He has not fallen recently.  He got over [place] fairly well, never had any antiviral medication treatment.  He had low back surgery under Dr. Trenton Gammon some 3 weeks ago, did not take the muscle relaxer and took maybe a total of 6 narcotic pain pills.  He has done well after surgery.  He has intermittent constipation, he reports that he has it under control fairly well, he hydrates well.  He has frequent urination at night, he tried Flomax for about a week but then he had back surgery and stopped the Flomax.  Nocturia is about 3-5 times per night, has trouble sleeping through the night and often is awake by 3:04 AM.  He takes his immediate release Sinemet first dose  around that and then every 2-1/2 hours after that.  He takes a total of 6-7 doses per day.  He stopped taking the CR as he did not feel any improvement on the long-acting, seemingly had worsening of his tremor throughout the day when he was on it for a few weeks.  He sleeps some on the couch when he wakes up early and tries to go back to bed around 6 does not really fall asleep consistently.  He did not try melatonin.   The patient's allergies, current medications, family history, past medical history, past social history, past surgical history and problem list were reviewed and updated as appropriate.    Previously (copied from previous notes for reference):        I saw him on 05/04/2020, at which time he reported not trying the Sinemet CR as we had talked about before.  He was encouraged to try Sinemet CR at bedtime and keep his regular levodopa the same.  He was advised to follow-up routinely in 6 months, he was considering the COVID-vaccine.   I saw him on 10/07/2019, at which time he felt more or less stable, had filled the Sinemet CR to be taken at bedtime but did not actually take it.  I did encourage him to started and we mutually agreed to keep his Sinemet, Mirapex and Azilect the same.     I saw him on  04/09/2019, at which time he was taking Sinemet 6 times a day about 2-1/2 hourly, reported more difficulty getting started in the morning, more slowness.  He was advised to add Sinemet CR at bedtime.    I saw him in a virtual visit on 10/02/2018, at which time he reported feeling fairly stable.  He had some intermittent issues with constipation and his balance and mobility.  He was advised to continue with Sinemet 1 pill 6 times a day and Mirapex 1 pill 3 times a day as well as Azilect 1 pill once daily.    I saw him on 04/03/2018, at which time he reported doing well, he had had lumbar spine surgery under Dr. Trenton Gammon in July 2019 and had done well after it.  He was not on any pain medication.   He was trying to stay active.  Sinemet was 1 pill 5 or 6 times a day, more consistently 5 times a day.  He was taking Mirapex twice daily 1 mg strength and sometimes an additional half pill during the middle of the day.  He had tried hemp for back pain which he found helpful, he was wondering about the usefulness of CBD oil.  I suggested we keep his medication regimen the same, including Azilect 1 mg once daily in the morning, Mirapex and Sinemet.  We talked about potentially doing speech therapy evaluation.       I saw him on 10/01/2017, at which time he reported doing okay from a Parkinson's standpoint, however, he started having more problems with shooting pains to the right leg and had an L spine MRI in December 2018. He started seeing a spine specialist. He had received injections which provided temporary relief.    I saw him on 04/02/2017, at which time he had issues with constipation, no cognitive issues, no recent falls, he did report that he may have pulled a muscle in the right hip area. We mutually agreed to continue his current medication regimen of Azilect once daily, Mirapex ER once daily and Sinemet 1 pill 5 times a day.   He called in the interim reporting that the Mirapex ER was to expensive and we switched him over to Mirapex IR 1 mg 3 times a day.   I saw him on 09/27/2016, at which time he reported doing okay but felt like his levodopa was wearing off after 3 hours. He was taking half a pill in the morning, 1 pill midday, then another full pill about 3-4 hours later and half a pill in the evening. Is not exercising on a regular basis but overall was fairly active. He had some constipation intermittently. Memory was stable, some forgetfulness reported, some mood irritability was reported by his girlfriend. He was getting his Mirapex ER brand-name from [place]. He was taking Azilect generic once daily. I suggested we continue with Mirapex ER 3 mg daily and Azilect 1 mg daily. I suggested  we increase his Sinemet to 1 pill 5 times a day. I asked him to exercise more regularly and be proactive about constipation issues.   Of note, the patient no showed for appointment on 07/12/2016 secondary to illness. I saw him on 01/10/2016, at which time he reported worsening of his posture in more stiffness. He had no significant problems with balance or constipation or mood. He was taking overall a low dose of Sinemet. I suggested he could continue with the current regimen but increase his second dose also to a whole pill. He  was taking half a pill for 3 doses and 1 whole pill at 3 PM. I suggested he could try to increase his Sinemet. Overall, he felt fairly stable but I did notice some changes on exam as well.   I saw him on 07/12/2015, at which time he reported feeling stable. He did not notice much in the way of difference after the increase in the Mirapex long-acting. Overall, he was less stressed. He was able to transition his mother took class nursing home and she did quite well given her age of 44. He was sleeping a little better not having to worry about his mother on the time. He was taking Sinemet half a pill 4 times a day sometimes a whole pill at 3 PM, sometimes would get only 3 doses and for the day. He is trying to stay active and trying to do better with his water intake as opposed to coffee and soda. I suggested he could consider alternating 1 pill with half a pill for a total of 3 pills daily, but he preferred to stay on the regimen of about 2 and half pills daily.   I saw him on 01/06/2015, at which time he reported doing fairly well overall. He was active. He had some increased stiffness particularly in the mornings and later in the evenings. He would sometimes forget his evening dose of Sinemet. He still had some reservations about taking Sinemet because of fear of potential long-term effects and side effects. We mutually agreed to keep his Sinemet at 3 half pills a day for 3 doses, and  one whole pill around 3 PM. He was advised to continue with Azilect once daily as well. I suggested he increase his Mirapex ER from 2.25 mg to 3 mg once daily. He was on brand-name Mirapex ER.   I saw him on 07/08/2014, at which time he reported more tremors, especially towards the end of the day. He reported more fatigue, and stiffness and slowness after 3 PM. He was taking Azilect once daily and brand-name Mirapex long-acting. He was taking Sinemet half a pill 3 times a day. He continued to be very active and take care of his elderly mother, 48 years old, alternating her care with his brother. I suggested he continue with Azilect once daily and Mirapex ER but he increase Sinemet to half a pill at 7 AM, half a pill at 11 AM, 1 whole pill at 3 PM and half a pill at 7 PM.   I saw him on 01/05/2014, at which time he reported feeling worse with his posture and his walking was slower. He felt stable with his mood and memory. He had mild constipation. He continued to work here and there. He reported no issues with driving. His mother continue to stay with him every other week and he was sharing care of her with his brother. His mother has dementia and is in her mid 49s. I suggested he continue with Azilect and Mirapex ER at the same doses but and a half pill of levodopa to his regimen.   I saw him on 06/16/2013, at which time he reported feeling stable, with the exception of occasional start hesitation and trouble turning. I encouraged him to increase his exercise to more daily walking exercises and kept his medications the same.   I first met him on 12/12/2012, at which time I felt that his exam was stable. I did not make any medication changes at the time but suggested he  consider taking coenzyme Q 10.     He previously followed by Dr. Morene Antu and was last seen by him on 06/14/2012, and which time Dr. Erling Cruz felt that he was doing very well. He did not make any changes to his medication regimen.   He was  diagnosed with idiopathic right-sided predominant Parkinson's disease in May 2007 and initially treated with long-acting Mirapex, rasagiline and levodopa. He exercises regularly. He has had some daytime somnolence. He snores at night. In February 2013 he went on disability. He denied compulsive behavior, orthostatic hypotension or edema. He has had some difficulty with his memory. His mother has dementia at age 21. He lives alone, and takes care of his mother, who stays with him one week and with his only brother one week. He stays active and tends to sleep better when his mother stays with his brother as he can rest more and relax more. He has had no depression, anxiety, hallucinations, delusions or major memory problems, or dyskinesias.  He had tried the Sinemet CR for about 7 to 10 days but felt jittery afterwards, has not taken it since January 2022. He continues to take Azilect once daily, Mirapex 3 times a day and Sinemet IR 1 pill 6 times a day, stopping around 6 AM every 2-1/2 hours.  Bedtime is generally between 9 and 11 PM.  His Past Medical History Is Significant For: Past Medical History:  Diagnosis Date   CAD (coronary artery disease), native coronary artery 09/06/2020   Coronary CTA was done showing a coronary calcium score of 201 placing him at the 60th percentile for age and gender.  He was noted to have 75 to 99% stenosis of the mid LAD with scattered nonobstructive plaquing elsewhere.  FFR 0.7 in D1 and 0.73 at the apex>>medical management   HLD (hyperlipidemia) 09/06/2020   Hypertension    Parkinson disease ([place])     His Past Surgical History Is Significant For: Past Surgical History:  Procedure Laterality Date   HERNIA REPAIR      His Family History Is Significant For: Family History  Problem Relation Age of Onset   Unexplained death Mother 68       was on coumadin, reason not known   Heart attack Father 31   Heart disease Brother        had 2 stents in his 1s    Parkinson's disease Neg Hx     His Social History Is Significant For: Social History   Socioeconomic History   Marital status: Married    Spouse name: Not on file   Number of children: 3   Years of education: 12th   Highest education level: Not on file  Occupational History   Occupation: Retired from tile work    Comment: retired  Tobacco Use   Smoking status: Never   Smokeless tobacco: Former    Types: Chew   Tobacco comments:    quit 8 years ago  Scientific laboratory technician Use: Never used  Substance and Sexual Activity   Alcohol use: No   Drug use: No   Sexual activity: Not on file  Other Topics Concern   Not on file  Social History Narrative   Patient is right handed, resides alone, mother resides with him 1/2 the time,has dementia   Social Determinants of Health   Financial Resource Strain: Not on file  Food Insecurity: Not on file  Transportation Needs: Not on file  Physical Activity: Not on file  Stress: Not on file  Social Connections: Not on file    His Allergies Are:  No Known Allergies:   His Current Medications Are:  Outpatient Encounter Medications as of 07/21/2021  Medication Sig   carbidopa-levodopa (SINEMET IR) 25-100 MG tablet TAKE 1 TABLET BY MOUTH 6 TIMES DAILY   diltiazem (CARDIZEM CD) 180 MG 24 hr capsule TAKE 1 CAPSULE BY MOUTH DAILY   ELIQUIS 5 MG TABS tablet TAKE 1 TABLET BY MOUTH TWICE DAILY   pramipexole (MIRAPEX) 1 MG tablet TAKE 1 TABLET BY MOUTH 3 TIMES DAILY   rasagiline (AZILECT) 1 MG TABS tablet TAKE 1 TABLET BY MOUTH DAILY   Carbidopa-Levodopa ER (SINEMET CR) 25-100 MG tablet controlled release Take 1 tablet by mouth 2 (two) times daily. Take at 10 PM daily and may take a second dose at 1:30 PM daily   No facility-administered encounter medications on file as of 07/21/2021.  :  Review of Systems:  Out of a complete 14 point review of systems, all are reviewed and negative with the exception of these symptoms as listed below:  Review of  Systems  Neurological:        Pt is here for Parkinson's follow up. Pt states his movements are slowing down . Pt states he is getting slower   Objective:  Neurological Exam  Physical Exam Physical Examination:   Vitals:   07/21/21 0908  BP: 129/75  Pulse: (!) 58    General Examination: The patient is a very pleasant 71 y.o. male in no acute distress. He appears well-developed and well-nourished and well groomed.   HEENT: Normocephalic, atraumatic, pupils are equal, round and reactive to light, extraocular tracking shows, mild saccadic breakdown without nystagmus noted. There is limitation to upper gaze. There is mild decrease in eye blink rate. Hearing is intact. Face is symmetric with mild facial masking. Neck is mildly rigid, slight left head tilt, stable. Oropharynx exam reveals mild mouth dryness. No significant airway crowding is noted. Mallampati is class II. Tongue protrudes centrally and palate elevates symmetrically. There is no drooling. Speech mild to moderately hypophonic, slightly hoarse sounding at times.  No dysarthria. Mild involuntary facial movements at times.    Chest: is clear to auscultation without wheezing, rhonchi or crackles noted.   Heart: sounds are regular and normal without murmurs, rubs or gallops noted.   Abdomen: is soft, non-tender and non-distended.    Extremities: There is no pitting edema in the distal lower extremities bilaterally.   Skin: is warm and dry with no trophic changes noted.     Musculoskeletal: exam reveals no obvious joint deformities.   Neurologically: Mental status: The patient is awake and alert, paying good  attention. He is able to provide his history but his wife provides details.  Memory is generally well-preserved.  Speech is hypophonic with no dysarthria noted. Mood is congruent and affect is normal.     Cranial nerves are as described above under HEENT exam. In addition, shoulder shrug is normal but L shoulder is  slightly lower than right.   Motor exam: Thin bulk, global strength normal for age is noted. There are mild lower body dyskinesias on exam today.      Tone is mildly rigid with presence of cogwheeling in the right upper extremity. There is overall mild to moderate bradykinesia. There is no obvious resting tremor.     Fine motor skills exam: Fine motor skills are mild to moderately impaired on the right and slightly better on the  left, fairly stable.   Cerebellar testing shows no dysmetria or intention tremor on finger to nose testing. Heel to shin is unremarkable bilaterally. There is no truncal or gait ataxia.   Sensory exam is intact to light touch in the UEs and LEs.   Gait, station and balance: He stands up from the seated position with minimal difficulty and does not need to push up with His hands. He needs no assistance. No veering to one side is noted. He is not noted to lean to the side. Posture is moderately stooped for age, but appears to be stable.  He walks with fairly good pace, decreased stride length and decreased arm swing on the right, slight insecurity with turns.     Assessment and Plan:    In summary, Jack Cobb is a very pleasant 71 year old male with an underlying medical history of hypertension and chest pain, status post hernia repair in 2012, coronary artery disease, atrial fibrillation, status post back surgery in February 2023, who presents for followup consultation of his right-sided predominant Parkinson's disease, akinetic-rigid type, with diagnosis dating back to 2007, symptoms perhaps dating back to 2006. With time, he has had mild progression, but had remained stable for years in the past. We have with time increased his Mirapex and also his Sinemet over time.  He tried Sinemet CR 25-100 mg strength recently but felt worse on it.  He no longer takes it.  He has been taking the immediate release Sinemet up to 7 times a day starting early around 4 AM.  He has not  tried melatonin and is encouraged to try it.  He will maintain on Sinemet IR 25-100 mg strength 1 pill 6-7 times a day, Mirapex 1 mg 3 times daily and Azilect 1 mg once daily.  For future information, he is advised to let us know if he has any elective surgery planned, we should probably taper the Azilect in preparation for surgery as it can in rare instances interfere with anesthetics and also narcotic pain medications.  He has had issues with lower back and sciatica type symptoms, s/p L spine surgery in 11/2017 and nor status post lumbar spine surgery in February 2023.   He is advised to follow-up routinely in about 6 months to see one of our nurse practitioners.  I answered all their questions today and the patient and his wife are in agreement. I spent 30 minutes in total face-to-face time and in reviewing records during pre-charting, more than 50% of which was spent in counseling and coordination of care, reviewing test results, reviewing medications and treatment regimen and/or in discussing or reviewing the diagnosis of PD, the prognosis and treatment options. Pertinent laboratory and imaging test results that were available during this visit with the patient were reviewed by me and considered in my medical decision making (see chart for details).

## 2021-07-21 NOTE — Patient Instructions (Signed)
As discussed, we will maintain you on the immediate release levodopa, you can take 1 pill 6-7 times a day, I will discontinue the long-acting levodopa.  You can continue with pramipexole 1 mg 3 times daily and Azilect 1 mg once daily.  For future reference, if you have any planned surgery, we should probably taper you off the Azilect in preparation.  In rare instances it can interfere with anesthesia medication and/or narcotic pain medication.  Please be proactive about constipation issues.  Try melatonin at night for sleep starting with 2 or 3 mg at bedtime and you can increase to 5 or 6 mg, even 10 mg if need be.  Please follow-up to see one of our nurse practitioners in about 6 months. ?

## 2021-07-27 ENCOUNTER — Other Ambulatory Visit: Payer: Self-pay | Admitting: Cardiovascular Disease

## 2021-07-27 DIAGNOSIS — I480 Paroxysmal atrial fibrillation: Secondary | ICD-10-CM

## 2021-07-27 NOTE — Telephone Encounter (Signed)
Eliquis 5mg  refill request received. Patient is 72 years old, weight-71.4kg, Crea-0.73 on 06/09/2021 via [facility] from [place], [place], and last seen by Dr. [place] on 05/31/2021 via Video Visit. Dose is appropriate based on dosing criteria. Will send in refill to requested pharmacy.   ?

## 2021-10-06 ENCOUNTER — Other Ambulatory Visit: Payer: Self-pay | Admitting: Neurology

## 2021-10-06 DIAGNOSIS — G20 Parkinson's disease: Secondary | ICD-10-CM

## 2021-10-24 ENCOUNTER — Other Ambulatory Visit: Payer: Self-pay | Admitting: *Deleted

## 2021-10-24 DIAGNOSIS — G20 Parkinson's disease: Secondary | ICD-10-CM

## 2021-10-24 MED ORDER — RASAGILINE MESYLATE 1 MG PO TABS
1.0000 mg | ORAL_TABLET | Freq: Every day | ORAL | 3 refills | Status: DC
Start: 2021-10-24 — End: 2022-10-26

## 2021-12-01 DIAGNOSIS — M48062 Spinal stenosis, lumbar region with neurogenic claudication: Secondary | ICD-10-CM | POA: Diagnosis not present

## 2022-01-25 NOTE — Progress Notes (Signed)
[facility] [address] [place]. [place] [address] [phone]       OFFICE FOLLOW UP NOTE  Mr. Jack Cobb Date of Birth:  05-11-1951 Medical Record Number:  [id number]    Primary neurologist: Dr. Frances Furbish Reason for visit: parkinson's disease    SUBJECTIVE:   CHIEF COMPLAINT:  Chief Complaint  Patient presents with   Follow-up parkinson    Pt reports feeling fine. No concerns. Room 3 with wife    HPI:   Update 01/25/2022 JM: patient returns for 6 month f/u re: Parkinsons disease.  He is accompanied by his wife.  Remains on Sinemet 1 tab every 2.5 hrs (6-7 times per day), mirapex and Azilect, tolerating well.  Does report wearing off of Sinemet about 15 minutes prior to next dose, has tried to take at 2-hour interval but would not feel well after taking it taking too close together.  Denies any recent falls.  Denies any new or worsening PD symptoms.     History from Dr. Teofilo Pod prior OV note provided for reference purposes only Mr. Jack Cobb is a 71 year old right-handed gentleman, with an underlying medical history of hypertension, HLP, PAF, CAD, s/p hernia repair in 2012, and s/p low back surgery in 2019, who presents for follow-up consultation of his right-sided predominant Parkinson's disease which was diagnosed in May 2007.  He is accompanied by his wife today.  I last saw him on 11/02/2020, at which time he felt less stable on his medication for Parkinson's disease.  He was followed by cardiology for his new onset atrial fibrillation.  He was diagnosed in January 2022.  He was advised to add Sinemet CR up to twice daily and continue with his Sinemet IR.  He was also advised to continue with Azilect and Mirapex at the current doses.  He had started diltiazem and Eliquis through cardiology.  He was advised to try melatonin at night for sleep.   His wife called in the interim in October 2022 reporting that patient had COVID symptoms and tested positive for  COVID-19.  He was advised to proceed to urgent care or emergency room for acute management of his COVID.   Today, 07/21/2021: He reports fairly stable. His wife has noted more involuntary movements around his face and of the legs.  He has not fallen recently.  He got over COVID fairly well, never had any antiviral medication treatment.  He had low back surgery under Dr. Dutch Quint some 3 weeks ago, did not take the muscle relaxer and took maybe a total of 6 narcotic pain pills.  He has done well after surgery.  He has intermittent constipation, he reports that he has it under control fairly well, he hydrates well.  He has frequent urination at night, he tried Flomax for about a week but then he had back surgery and stopped the Flomax.  Nocturia is about 3-5 times per night, has trouble sleeping through the night and often is awake by 3:04 AM.  He takes his immediate release Sinemet first dose around that and then every 2-1/2 hours after that.  He takes a total of 6-7 doses per day.  He stopped taking the CR as he did not feel any improvement on the long-acting, seemingly had worsening of his tremor throughout the day when he was on it for a few weeks.  He sleeps some on the couch when he wakes up early and tries to go back to bed around 6 does not really fall asleep  consistently.  He did not try melatonin.   The patient's allergies, current medications, family history, past medical history, past social history, past surgical history and problem list were reviewed and updated as appropriate.        ROS:   14 system review of systems performed and negative with exception of those listed in HPI  PMH:  Past Medical History:  Diagnosis Date   CAD (coronary artery disease), native coronary artery 09/06/2020   Coronary CTA was done showing a coronary calcium score of 201 placing him at the 60th percentile for age and gender.  He was noted to have 75 to 99% stenosis of the mid LAD with scattered nonobstructive  plaquing elsewhere.  FFR 0.7 in D1 and 0.73 at the apex>>medical management   HLD (hyperlipidemia) 09/06/2020   Hypertension    Parkinson disease (HCC)     PSH:  Past Surgical History:  Procedure Laterality Date   HERNIA REPAIR      Social History:  Social History   Socioeconomic History   Marital status: Married    Spouse name: Not on file   Number of children: 3   Years of education: 12th   Highest education level: Not on file  Occupational History   Occupation: Retired from tile work    Comment: retired  Tobacco Use   Smoking status: Never   Smokeless tobacco: Former    Types: Chew   Tobacco comments:    quit 8 years ago  Building services engineer Use: Never used  Substance and Sexual Activity   Alcohol use: No   Drug use: No   Sexual activity: Not on file  Other Topics Concern   Not on file  Social History Narrative   Patient is right handed, resides alone, mother resides with him 1/2 the time,has dementia   Social Determinants of Corporate investment banker Strain: Not on file  Food Insecurity: Not on file  Transportation Needs: Not on file  Physical Activity: Not on file  Stress: Not on file  Social Connections: Not on file  Intimate Partner Violence: Not on file    Family History:  Family History  Problem Relation Age of Onset   Unexplained death Mother 26       was on coumadin, reason not known   Heart attack Father 28   Heart disease Brother        had 2 stents in his 68s   Parkinson's disease Neg Hx     Medications:   Current Outpatient Medications on File Prior to Visit  Medication Sig Dispense Refill   apixaban (ELIQUIS) 5 MG TABS tablet TAKE 1 TABLET BY MOUTH TWICE DAILY 60 tablet 10   carbidopa-levodopa (SINEMET IR) 25-100 MG tablet Take one pill every 2.5 hours for a total of 6 to 7 times per day. 630 tablet 3   diltiazem (CARDIZEM CD) 180 MG 24 hr capsule TAKE 1 CAPSULE BY MOUTH DAILY 90 capsule 3   pramipexole (MIRAPEX) 1 MG tablet TAKE 1  TABLET BY MOUTH 3 TIMES DAILY 270 tablet 1   rasagiline (AZILECT) 1 MG TABS tablet Take 1 tablet (1 mg total) by mouth daily. 90 tablet 3   tamsulosin (FLOMAX) 0.4 MG CAPS capsule SMARTSIG:1 Capsule(s) By Mouth Every Evening     No current facility-administered medications on file prior to visit.    Allergies:  No Known Allergies    OBJECTIVE:  Physical Exam  Vitals:   01/26/22 0850  BP: 128/76  Pulse: 79  Weight: 157 lb 4 oz (71.3 kg)  Height: 5\' 10"  (1.778 m)   Body mass index is 22.56 kg/m. No results found.   General: well developed, well nourished, pleasant elderly Caucasian male, seated, in no evident distress HEENT: head normocephalic and atraumatic.  Mild facial masking. Cardiovascular: regular rate and rhythm, no murmurs Musculoskeletal: no deformity Skin:  no rash/petichiae Vascular:  Normal pulses all extremities   Neurologic Exam Mental Status: Awake and fully alert.  Mild to moderately hypophonic.  Oriented to place and time. Recent and remote memory intact. Attention span, concentration and fund of knowledge appropriate. Mood and affect appropriate.  Cranial Nerves: Pupils equal, briskly reactive to light. Extraocular movements mild saccadic breakdown without nystagmus, some limitation to upper gaze. Visual fields full to confrontation. Hearing intact. Facial sensation intact. Face, tongue, palate moves normally and symmetrically.  Motor: Normal strength in all tested extremity muscles.  Mildly increased tone RUE with cogwheel rigidity.  Mild to moderate bradykinesia.  No evidence of tremor. Sensory.: intact to touch , pinprick , position and vibratory sensation.  Coordination: Rapid alternating movements mild to moderately impaired on the right, very slight on left. Finger-to-nose and heel-to-shin performed accurately bilaterally. Gait and Station: Arises from chair without difficulty.  Posture is mildly stooped. Gait demonstrates slightly decreased stride  length and decreased arm swing on the right, mild difficulty with turns.  No use of AD. Reflexes: 1+ and symmetric. Toes downgoing.         ASSESSMENT/PLAN: EWEN VARNELL is a 71 y.o. year old male   Parkinson disease, right sided predominant  Does have worsening of gait after about 2 hrs and 15 min with Sinemet, unable to tolerate taking every 2 hours Continue Sinemet, Mirapex and Azilect  Will further discuss with Dr. 66 if there are any other recommendations to help with worsening symptoms in between dosages No benefit/intolerant with Sinemet CR    Follow up in 6 months or call earlier if needed    CC:  PCP: Frances Furbish, Lonie Peak   [facility] provider: Dr. Cordelia Poche  I spent 31 minutes of face-to-face and non-face-to-face time with patient and wife.  This included previsit chart review, lab review, study review, order entry, electronic health record documentation, patient education regarding above diagnoses and treatment plan and answered all other questions to patient and wife satisfaction   Frances Furbish, AGNP-BC  [facility] [address] [place], [place]  Phone [phone] Fax [phone] Note: This document was prepared with digital dictation and possible smart phrase technology. Any transcriptional errors that result from this process are unintentional.

## 2022-01-26 ENCOUNTER — Ambulatory Visit: Payer: Medicare Other | Admitting: Adult Health

## 2022-01-26 ENCOUNTER — Encounter: Payer: Self-pay | Admitting: Adult Health

## 2022-01-26 VITALS — BP 128/76 | HR 79 | Ht 70.0 in | Wt 157.2 lb

## 2022-01-26 DIAGNOSIS — G20 Parkinson's disease: Secondary | ICD-10-CM | POA: Diagnosis not present

## 2022-01-26 NOTE — Patient Instructions (Signed)
Your Plan:  Continue Sinemet, Mirapex and Azilect   Will speak with Dr. Frances Furbish regarding other medication options - if so, we will give you a call    Follow up in 6 months or call earlier if needed       Thank you for coming to see [place] at [facility]. I hope we have been able to provide you high quality care today.  You may receive a patient satisfaction survey over the next few weeks. We would appreciate your feedback and comments so that we may continue to improve ourselves and the health of our patients.

## 2022-01-30 NOTE — Progress Notes (Signed)
Please advise patient/wife Dr. Teofilo Pod recommendations, could consider increasing pramipexole to 1.5 mg 3 times daily which would be the maximum dose. If they are interested in increasing, please let me know and I will place a new order. Thank you.

## 2022-02-02 DIAGNOSIS — M48062 Spinal stenosis, lumbar region with neurogenic claudication: Secondary | ICD-10-CM | POA: Diagnosis not present

## 2022-03-22 NOTE — Progress Notes (Unsigned)
Date:  03/23/2022   ID:  DAKARI CREGGER, DOB 06-Oct-1950, MRN [id number] The patient was identified using 2 identifiers.  PCP:  Lonie Peak, PA-C   [facility] Providers Cardiologist:  Armanda Magic, MD     Evaluation Performed:  Follow-Up Visit  Chief Complaint:  CAD, HLD  History of Present Illness:    Jack Cobb is a 71 y.o. male with a hx of ASCAD, PAF, HTN and Parkinson's disease.  He was hospitalized in Jan 2022 with afib with RVR and converted spontaneously on Cardizem to NSR. hsTrop was elevated and coronary CTA was done showing a coronary calcium score of 201 placing him at the 60th percentile for age and gender.  He was noted to have 75 to 99% stenosis of the mid LAD with scattered nonobstructive plaquing elsewhere.     He was seen by Dr. Excell Seltzer for discussion of possible Cardiac catheterization with possible LAD PCI. The patient's coronary CTA was sent for [facility] evaluation.  This showed an FFR of 0.7 in the first diagonal.  The LAD FFR was 0.73 at the apex but was actually normal beyond the area of concern at 0.88 and 0.83. Medical management was recommended given that the apical LAD FFR has not correlated well with in lab findings at cath and he was not having any chest pain.   He is here today for followup and is doing well.  He denies any chest pain or pressure, SOB, DOE (except when his Parkinson's meds wear off), PND, orthopnea, LE edema, dizziness, palpitations or syncope. He is compliant with his meds and is tolerating meds with no SE.    Past Medical History:  Diagnosis Date   CAD (coronary artery disease), native coronary artery 09/06/2020   Coronary CTA was done showing a coronary calcium score of 201 placing him at the 60th percentile for age and gender.  He was noted to have 75 to 99% stenosis of the mid LAD with scattered nonobstructive plaquing elsewhere.  FFR 0.7 in D1 and 0.73 at the apex>>medical management   HLD (hyperlipidemia) 09/06/2020    Hypertension    Parkinson disease    Past Surgical History:  Procedure Laterality Date   HERNIA REPAIR       Current Meds  Medication Sig   apixaban (ELIQUIS) 5 MG TABS tablet TAKE 1 TABLET BY MOUTH TWICE DAILY   carbidopa-levodopa (SINEMET IR) 25-100 MG tablet Take one pill every 2.5 hours for a total of 6 to 7 times per day.   diltiazem (CARDIZEM CD) 180 MG 24 hr capsule TAKE 1 CAPSULE BY MOUTH DAILY   pramipexole (MIRAPEX) 1 MG tablet TAKE 1 TABLET BY MOUTH 3 TIMES DAILY   rasagiline (AZILECT) 1 MG TABS tablet Take 1 tablet (1 mg total) by mouth daily.   tamsulosin (FLOMAX) 0.4 MG CAPS capsule SMARTSIG:1 Capsule(s) By Mouth Every Evening     Allergies:   Patient has no known allergies.   Social History   Tobacco Use   Smoking status: Never   Smokeless tobacco: Former    Types: Chew   Tobacco comments:    quit 8 years ago  Vaping Use   Vaping Use: Never used  Substance Use Topics   Alcohol use: No   Drug use: No     Family Hx: The patient's family history includes Heart attack (age of onset: 54) in his father; Heart disease in his brother; Unexplained death (age of onset: 44) in his mother. There is no history  of Parkinson's disease.  ROS:   Please see the history of present illness.     All other systems reviewed and are negative.   Prior CV studies:   The following studies were reviewed today:  none  Labs/Other Tests and Data Reviewed:    EKG:  Sinus bradycardia with PACs and LVH by voltage criteria  Recent Labs: No results found for requested labs within last 365 days.   Recent Lipid Panel Lab Results  Component Value Date/Time   CHOL 184 05/31/2021 08:02 AM   TRIG 82 05/31/2021 08:02 AM   HDL 67 05/31/2021 08:02 AM   CHOLHDL 2.7 05/31/2021 08:02 AM   LDLCALC 102 (H) 05/31/2021 08:02 AM   LDLDIRECT 85 09/27/2020 08:56 AM    Wt Readings from Last 3 Encounters:  03/23/22 158 lb 3.2 oz (71.8 kg)  01/26/22 157 lb 4 oz (71.3 kg)  07/21/21 157 lb  6.4 oz (71.4 kg)     Risk Assessment/Calculations:    CHA2DS2-VASc Score = 3  This indicates a 3.2% annual risk of stroke. The patient's score is based upon:     Objective:    Vital Signs:  BP 116/70   Pulse (!) 59   Ht 5\' 10"  (1.778 m)   Wt 158 lb 3.2 oz (71.8 kg)   SpO2 97%   BMI 22.70 kg/m   GEN: Well nourished, well developed in no acute distress HEENT: Normal NECK: No JVD; No carotid bruits LYMPHATICS: No lymphadenopathy CARDIAC:RRR, no murmurs, rubs, gallops RESPIRATORY:  Clear to auscultation without rales, wheezing or rhonchi  ABDOMEN: Soft, non-tender, non-distended MUSCULOSKELETAL:  No edema; No deformity  SKIN: Warm and dry NEUROLOGIC:  Alert and oriented x 3 PSYCHIATRIC:  Normal affect   ASSESSMENT & PLAN:    1.  ASCAD -coronary CTA was done showing a coronary calcium score of 201 placing him at the 60th percentile for age and gender.  He was noted to have 75 to 99% stenosis of the mid LAD with scattered nonobstructive plaquing elsewhere.   -given lack of CP and FFR with only distal reduction in flow>>medical management was recommended -He denies any anginal symptoms since I saw him last -no ASA due to DOAC -Refused statin therapy because of his memory issues with Parkinson's  2.  HLD -LDL goal is < 70 -he does not want to start a statin because he has memory issues with his Parkinson's  -He was referred to lipid clinic but did not want to try statins  -Repeat FLP and ALT  3.  PAF -He is maintaining normal sinus rhythm on exam today and denies any palpitations-CHADS2VASC score is 3 (HTN, age>65 and CAD) -He has not had any bleeding problems on DOAC -Continue prescription drug management with apixaban 5 mg twice daily and Cardizem CD 180 mg daily with as needed refills -Check BMET and CBC  4.  HTN -BP is well controlled on exam today -Continue prescription drug management with Cardizem CD 180 mg daily with as needed refills     Medication  Adjustments/Labs and Tests Ordered: Current medicines are reviewed at length with the patient today.  Concerns regarding medicines are outlined above.   Tests Ordered: Orders Placed This Encounter  Procedures   EKG 12-Lead    Medication Changes: No orders of the defined types were placed in this encounter.   Follow Up:  In Person in 6 month(s)  Signed, Fransico Him, MD  03/23/2022 9:00 AM    [place]

## 2022-03-23 ENCOUNTER — Ambulatory Visit: Payer: Medicare Other | Attending: Cardiology | Admitting: Cardiology

## 2022-03-23 ENCOUNTER — Encounter: Payer: Self-pay | Admitting: Cardiology

## 2022-03-23 VITALS — BP 116/70 | HR 59 | Ht 70.0 in | Wt 158.2 lb

## 2022-03-23 DIAGNOSIS — I10 Essential (primary) hypertension: Secondary | ICD-10-CM | POA: Diagnosis not present

## 2022-03-23 DIAGNOSIS — I480 Paroxysmal atrial fibrillation: Secondary | ICD-10-CM

## 2022-03-23 DIAGNOSIS — I2510 Atherosclerotic heart disease of native coronary artery without angina pectoris: Secondary | ICD-10-CM

## 2022-03-23 DIAGNOSIS — E7800 Pure hypercholesterolemia, unspecified: Secondary | ICD-10-CM

## 2022-03-23 NOTE — Addendum Note (Signed)
Addended by: Bernestine Amass on: 03/23/2022 09:10 AM   Modules accepted: Orders

## 2022-03-23 NOTE — Patient Instructions (Addendum)
Medication Instructions:  Your physician recommends that you continue on your current medications as directed. Please refer to the Current Medication list given to you today.  *If you need a refill on your cardiac medications before your next appointment, please call your pharmacy*  Lab Work: TODAY: CBC. CMET, FLP If you have labs (blood work) drawn today and your tests are completely normal, you will receive your results only by: [place] (if you have MyChart) OR A paper copy in the mail If you have any lab test that is abnormal or we need to change your treatment, we will call you to review the results.  Follow-Up: At [facility], you and your health needs are our priority.  As part of our continuing mission to provide you with exceptional heart care, we have created designated Provider Care Teams.  These Care Teams include your primary Cardiologist (physician) and Advanced Practice Providers (APPs -  Physician Assistants and Nurse Practitioners) who all work together to provide you with the care you need, when you need it.  Your next appointment:   1 year  The format for your next appointment:   In Person  Provider:   Fransico Him, MD

## 2022-03-24 ENCOUNTER — Telehealth: Payer: Self-pay

## 2022-03-24 DIAGNOSIS — E785 Hyperlipidemia, unspecified: Secondary | ICD-10-CM

## 2022-03-24 LAB — COMPREHENSIVE METABOLIC PANEL WITH GFR
ALT: 6 [IU]/L (ref 0–44)
AST: 14 [IU]/L (ref 0–40)
Albumin/Globulin Ratio: 2.2 (ref 1.2–2.2)
Albumin: 4.8 g/dL (ref 3.9–4.9)
Alkaline Phosphatase: 107 [IU]/L (ref 44–121)
BUN/Creatinine Ratio: 20 (ref 10–24)
BUN: 16 mg/dL (ref 8–27)
Bilirubin Total: 0.7 mg/dL (ref 0.0–1.2)
CO2: 26 mmol/L (ref 20–29)
Calcium: 9.9 mg/dL (ref 8.6–10.2)
Chloride: 102 mmol/L (ref 96–106)
Creatinine, Ser: 0.82 mg/dL (ref 0.76–1.27)
Globulin, Total: 2.2 g/dL (ref 1.5–4.5)
Glucose: 114 mg/dL — ABNORMAL HIGH (ref 70–99)
Potassium: 4.7 mmol/L (ref 3.5–5.2)
Sodium: 138 mmol/L (ref 134–144)
Total Protein: 7 g/dL (ref 6.0–8.5)
eGFR: 94 mL/min/{1.73_m2}

## 2022-03-24 LAB — CBC
Hematocrit: 44.9 % (ref 37.5–51.0)
Hemoglobin: 15.6 g/dL (ref 13.0–17.7)
MCH: 31.6 pg (ref 26.6–33.0)
MCHC: 34.7 g/dL (ref 31.5–35.7)
MCV: 91 fL (ref 79–97)
Platelets: 261 10*3/uL (ref 150–450)
RBC: 4.93 x10E6/uL (ref 4.14–5.80)
RDW: 12.4 % (ref 11.6–15.4)
WBC: 7.4 10*3/uL (ref 3.4–10.8)

## 2022-03-24 LAB — LIPID PANEL
Chol/HDL Ratio: 2.7 ratio (ref 0.0–5.0)
Cholesterol, Total: 207 mg/dL — ABNORMAL HIGH (ref 100–199)
HDL: 76 mg/dL
LDL Chol Calc (NIH): 119 mg/dL — ABNORMAL HIGH (ref 0–99)
Triglycerides: 65 mg/dL (ref 0–149)
VLDL Cholesterol Cal: 12 mg/dL (ref 5–40)

## 2022-03-24 NOTE — Telephone Encounter (Signed)
-----   Message from Sueanne Margarita, MD sent at 03/23/2022  5:48 PM EDT ----- Lipids still not at goal.  Please forward to lipid clinic for further recommendations.

## 2022-04-11 ENCOUNTER — Telehealth: Payer: Self-pay | Admitting: Adult Health

## 2022-04-11 DIAGNOSIS — G20A1 Parkinson's disease without dyskinesia, without mention of fluctuations: Secondary | ICD-10-CM

## 2022-04-11 MED ORDER — PRAMIPEXOLE DIHYDROCHLORIDE 1 MG PO TABS: 1.0000 mg | ORAL_TABLET | Freq: Three times a day (TID) | ORAL | 1 refills | Status: DC

## 2022-04-11 NOTE — Telephone Encounter (Signed)
Pt request refill for  pramipexole (MIRAPEX) 1 MG tablet at  [facility]

## 2022-04-11 NOTE — Telephone Encounter (Signed)
Refill has been sent to the drugstore pt requested.

## 2022-05-18 ENCOUNTER — Ambulatory Visit: Payer: Medicare Other

## 2022-05-25 ENCOUNTER — Ambulatory Visit: Payer: Medicare Other | Attending: Internal Medicine | Admitting: Student

## 2022-05-25 DIAGNOSIS — E7849 Other hyperlipidemia: Secondary | ICD-10-CM

## 2022-05-25 DIAGNOSIS — M48062 Spinal stenosis, lumbar region with neurogenic claudication: Secondary | ICD-10-CM | POA: Diagnosis not present

## 2022-05-25 MED ORDER — ROSUVASTATIN CALCIUM 10 MG PO TABS: 10.0000 mg | ORAL_TABLET | Freq: Every day | ORAL | 3 refills | Status: AC

## 2022-05-25 NOTE — Assessment & Plan Note (Addendum)
Assessment:  LDL goal: < 70 mg/dl last LDLc 119 mg/dl (03/23/2022) Never tried any statins due to parkinson and pain; heard and read about lots of muscle/joints and some memory/ cognition related issues from statins  Discussed all lipid lowering agents including statins,PCSK-9 inhibitors, bempedoic acid and inclisiran; cost, dosing efficacy, side effects  Also discussed common insurance coverage criteria for injectable lipid lowering agents  Patient and his wife are in agreement that he should try rosuvastatin and he could self adjust the dose if he start experiencing any S/E  Plan: Start taking rosuvastatin 10 mg daily if not tolerated can reduce the dose to 5 mg daily or 5 mg every other day or even 5 mg twice week  PharmD will follow up in 2 weeks to assess tolerability  In future can consider adding ezetimibe or PCSK9i

## 2022-05-25 NOTE — Progress Notes (Signed)
Patient ID: Jack Cobb                 DOB: Aug 17, 1950                    MRN: [id number]      HPI: Jack Cobb is a 72 y.o. male patient referred to lipid clinic by Dr.Turner. PMH is significant for hx of ASCAD, PAF, HTN and Parkinson's disease.  Today patient presented with his wife. Patient states his parkinson affect his cognition and he does not want to add one more reason (statin) that could affect his cognition and memory. Also he has lots of back and shoulder pain does not want to add any medication that worsen his pain. Patient and his wife reports that he has never tried any statins they just read about the side effects and heard from people that it causes muscle and joint pain   Reviewed options for lowering LDL cholesterol, - Statins, ezetimibe, PCSK-9 inhibitors, bempedoic acid and inclisiran.  Discussed mechanisms of action, dosing, side effects and potential decreases in LDL cholesterol.  Also reviewed cost information and potential options for patient assistance.   Diet: eats generally healthy   Exercise: try to stay active and busy in yard whole day, due to back pain does not do regular exercise   Family History: father- heart disease at age of 23 and died at age of 74 from CHF, mother- lived till 56 no major issues, siblings - once brother- stent put in at age of 5.  Social History:  Alcohol:none Smoking: never  Labs: Lipid Panel     Component Value Date/Time   CHOL 207 (H) 03/23/2022 0918   TRIG 65 03/23/2022 0918   HDL 76 03/23/2022 0918   CHOLHDL 2.7 03/23/2022 0918   LDLCALC 119 (H) 03/23/2022 0918   LDLDIRECT 85 09/27/2020 0856   LABVLDL 12 03/23/2022 0918    Past Medical History:  Diagnosis Date   CAD (coronary artery disease), native coronary artery 09/06/2020   Coronary CTA was done showing a coronary calcium score of 201 placing him at the 60th percentile for age and gender.  He was noted to have 75 to 99% stenosis of the mid LAD with  scattered nonobstructive plaquing elsewhere.  FFR 0.7 in D1 and 0.73 at the apex>>medical management   HLD (hyperlipidemia) 09/06/2020   Hypertension    Parkinson disease     Current Outpatient Medications on File Prior to Visit  Medication Sig Dispense Refill   apixaban (ELIQUIS) 5 MG TABS tablet TAKE 1 TABLET BY MOUTH TWICE DAILY 60 tablet 10   carbidopa-levodopa (SINEMET IR) 25-100 MG tablet Take one pill every 2.5 hours for a total of 6 to 7 times per day. 630 tablet 3   diltiazem (CARDIZEM CD) 180 MG 24 hr capsule TAKE 1 CAPSULE BY MOUTH DAILY 90 capsule 3   pramipexole (MIRAPEX) 1 MG tablet Take 1 tablet (1 mg total) by mouth 3 (three) times daily. 270 tablet 1   rasagiline (AZILECT) 1 MG TABS tablet Take 1 tablet (1 mg total) by mouth daily. 90 tablet 3   tamsulosin (FLOMAX) 0.4 MG CAPS capsule SMARTSIG:1 Capsule(s) By Mouth Every Evening     No current facility-administered medications on file prior to visit.    No Known Allergies  Assessment/Plan:  1. Hyperlipidemia -  Problem  Hld (Hyperlipidemia)   Current Medications: none Intolerances:  none never tried any cholesterol medication  Risk Factors: CAD, hypertension, coronary  calcium score of 201,premature CVD in father and brother  LDL goal: <70    HLD (hyperlipidemia) Assessment:  LDL goal: < 70 mg/dl last LDLc 119 mg/dl (03/23/2022) Never tried any statins due to parkinson and pain; heard and read about lots of muscle/joints and some memory/ cognition related issues from statins  Discussed all lipid lowering agents including statins,PCSK-9 inhibitors, bempedoic acid and inclisiran; cost, dosing efficacy, side effects  Also discussed common insurance coverage criteria for injectable lipid lowering agents  Patient and his wife are in agreement that he should try rosuvastatin and he could self adjust the dose if he start experiencing any S/E  Plan: Start taking rosuvastatin 10 mg daily if not tolerated can reduce the  dose to 5 mg daily or 5 mg every other day or even 5 mg twice week  PharmD will follow up in 2 weeks to assess tolerability  In future can consider adding ezetimibe or PCSK9i    Thank you,  Cammy Copa, Pharm.D [facility] [address] [place], [place] [address]  Phone: [phone]; Fax: [phone]

## 2022-06-01 DIAGNOSIS — M5126 Other intervertebral disc displacement, lumbar region: Secondary | ICD-10-CM | POA: Diagnosis not present

## 2022-06-01 DIAGNOSIS — M48062 Spinal stenosis, lumbar region with neurogenic claudication: Secondary | ICD-10-CM | POA: Diagnosis not present

## 2022-06-08 ENCOUNTER — Telehealth: Payer: Self-pay | Admitting: *Deleted

## 2022-06-08 DIAGNOSIS — M48062 Spinal stenosis, lumbar region with neurogenic claudication: Secondary | ICD-10-CM | POA: Diagnosis not present

## 2022-06-08 NOTE — Telephone Encounter (Signed)
   Pre-operative Risk Assessment    Patient Name: Jack Cobb  DOB: [birth year] MRN: [id number]      Request for Surgical Clearance    Procedure:   L5-S1 LUMAR LAMINECTOMY  Date of Surgery:  Clearance TBD                                 Surgeon:  Charlie Pitter Surgeon's Group or Practice Name:  [place] Phone number:  [phone] Fax number:  [phone]   Type of Clearance Requested:   - Pharmacy:  Hold Apixaban (Eliquis) NOT INDICATED HOW LONG   Type of Anesthesia:  General    Additional requests/questions:    Astrid Divine   06/08/2022, 4:30 PM

## 2022-06-12 DIAGNOSIS — E7800 Pure hypercholesterolemia, unspecified: Secondary | ICD-10-CM | POA: Diagnosis not present

## 2022-06-12 DIAGNOSIS — Z9181 History of falling: Secondary | ICD-10-CM | POA: Diagnosis not present

## 2022-06-12 DIAGNOSIS — I10 Essential (primary) hypertension: Secondary | ICD-10-CM | POA: Diagnosis not present

## 2022-06-12 DIAGNOSIS — I2510 Atherosclerotic heart disease of native coronary artery without angina pectoris: Secondary | ICD-10-CM | POA: Diagnosis not present

## 2022-06-12 DIAGNOSIS — Z139 Encounter for screening, unspecified: Secondary | ICD-10-CM | POA: Diagnosis not present

## 2022-06-12 DIAGNOSIS — G20A1 Parkinson's disease without dyskinesia, without mention of fluctuations: Secondary | ICD-10-CM | POA: Diagnosis not present

## 2022-06-12 DIAGNOSIS — Z0000 Encounter for general adult medical examination without abnormal findings: Secondary | ICD-10-CM | POA: Diagnosis not present

## 2022-06-12 DIAGNOSIS — I480 Paroxysmal atrial fibrillation: Secondary | ICD-10-CM | POA: Diagnosis not present

## 2022-06-12 NOTE — Telephone Encounter (Signed)
   Patient Name: Jack Cobb  DOB: August 12, 1950 MRN: [id number]  Primary Cardiologist: Fransico Him, MD  Clinical pharmacists have reviewed the patient's past medical history, labs, and current medications as part of preoperative protocol coverage. The following recommendations have been made:  Patient with diagnosis of afib on Eliquis for anticoagulation.     Procedure: L5-S1 LUMAR LAMINECTOMY  Date of procedure: TBD     CHA2DS2-VASc Score = 3  This indicates a 3.2% annual risk of stroke. The patient's score is based upon: CHF History: 0 HTN History: 1 Diabetes History: 0 Stroke History: 0 Vascular Disease History: 1 Age Score: 1 Gender Score: 0      CrCl 85 ml/min   Per office protocol, patient can hold Eliquis for 3 days prior to procedure.  Please resume Eliquis as soon as possible postprocedure, at the discretion of the surgeon.    I will route this recommendation to the requesting party via Epic fax function and remove from pre-op pool.  Please call with questions.  Lenna Sciara, NP 06/12/2022, 11:28 AM

## 2022-06-12 NOTE — Telephone Encounter (Signed)
Patient with diagnosis of afib on Eliquis for anticoagulation.    Procedure: L5-S1 LUMAR LAMINECTOMY  Date of procedure: TBD   CHA2DS2-VASc Score = 3   This indicates a 3.2% annual risk of stroke. The patient's score is based upon: CHF History: 0 HTN History: 1 Diabetes History: 0 Stroke History: 0 Vascular Disease History: 1 Age Score: 1 Gender Score: 0      CrCl 85 ml/min  Per office protocol, patient can hold Eliquis for 3 days prior to procedure.    **This guidance is not considered finalized until pre-operative APP has relayed final recommendations.**

## 2022-06-20 DIAGNOSIS — M4807 Spinal stenosis, lumbosacral region: Secondary | ICD-10-CM | POA: Diagnosis not present

## 2022-06-20 DIAGNOSIS — M4726 Other spondylosis with radiculopathy, lumbar region: Secondary | ICD-10-CM | POA: Diagnosis not present

## 2022-06-20 DIAGNOSIS — M4727 Other spondylosis with radiculopathy, lumbosacral region: Secondary | ICD-10-CM | POA: Diagnosis not present

## 2022-06-20 DIAGNOSIS — M48061 Spinal stenosis, lumbar region without neurogenic claudication: Secondary | ICD-10-CM | POA: Diagnosis not present

## 2022-06-20 DIAGNOSIS — M5117 Intervertebral disc disorders with radiculopathy, lumbosacral region: Secondary | ICD-10-CM | POA: Diagnosis not present

## 2022-07-20 ENCOUNTER — Other Ambulatory Visit: Payer: Self-pay | Admitting: Cardiology

## 2022-07-20 ENCOUNTER — Other Ambulatory Visit: Payer: Self-pay | Admitting: *Deleted

## 2022-07-20 DIAGNOSIS — I480 Paroxysmal atrial fibrillation: Secondary | ICD-10-CM

## 2022-07-20 DIAGNOSIS — G20A1 Parkinson's disease without dyskinesia, without mention of fluctuations: Secondary | ICD-10-CM

## 2022-07-20 MED ORDER — CARBIDOPA-LEVODOPA 25-100 MG PO TABS: ORAL_TABLET | ORAL | 3 refills | Status: DC

## 2022-07-20 NOTE — Telephone Encounter (Signed)
Prescription refill request for Eliquis received. Indication: afib  Last office visit: Turner, 03/23/2022 Scr: 0.83, 03/23/2022 Age: 72 yo  Weight: 71.8 kg   Refill sent.

## 2022-07-26 NOTE — Progress Notes (Unsigned)
[facility] [address] [place]. [place] [address] [phone]       OFFICE FOLLOW UP NOTE  Mr. ALTER SUPINO Date of Birth:  1950/07/01 Medical Record Number:  [id number]    Primary neurologist: Dr. Rexene Alberts Reason for visit: parkinson's disease    SUBJECTIVE:   CHIEF COMPLAINT:  No chief complaint on file.   Brief HPI:   Jack Cobb is a 72 y.o. 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   At prior visit on 01/25/2022, he continued on Sinemet 1 tab every 2.5 hrs, Mirapex 1 mg TID and Azilect 1 mg daily. Did report some wearing off in between Sinemet dosages but unable to tolerate taking every 2 hours.    Interval history:      Update 01/25/2022 JM: patient returns for 6 month f/u re: Parkinsons disease.  He is accompanied by his wife.  Remains on Sinemet 1 tab every 2.5 hrs (6-7 times per day), mirapex and Azilect, tolerating well.  Does report wearing off of Sinemet about 15 minutes prior to next dose, has tried to take at 2-hour interval but would not feel well after taking it taking too close together.  Denies any recent falls.  Denies any new or worsening PD symptoms.       ROS:   14 system review of systems performed and negative with exception of those listed in HPI  PMH:  Past Medical History:  Diagnosis Date   CAD (coronary artery disease), native coronary artery 09/06/2020   Coronary CTA was done showing a coronary calcium score of 201 placing him at the 60th percentile for age and gender.  He was noted to have 75 to 99% stenosis of the mid LAD with scattered nonobstructive plaquing elsewhere.  FFR 0.7 in D1 and 0.73 at the apex>>medical management   HLD (hyperlipidemia) 09/06/2020   Hypertension    Parkinson disease     PSH:  Past Surgical History:  Procedure Laterality Date   HERNIA REPAIR      Social History:  Social History   Socioeconomic History   Marital status:  Married    Spouse name: Not on file   Number of children: 3   Years of education: 12th   Highest education level: Not on file  Occupational History   Occupation: Retired from tile work    Comment: retired  Tobacco Use   Smoking status: Never   Smokeless tobacco: Former    Types: Chew   Tobacco comments:    quit 8 years ago  Scientific laboratory technician Use: Never used  Substance and Sexual Activity   Alcohol use: No   Drug use: No   Sexual activity: Not on file  Other Topics Concern   Not on file  Social History Narrative   Patient is right handed, resides alone, mother resides with him 1/2 the time,has dementia   Social Determinants of Health   Financial Resource Strain: Not on file  Food Insecurity: Not on file  Transportation Needs: Not on file  Physical Activity: Not on file  Stress: Not on file  Social Connections: Not on file  Intimate Partner Violence: Not on file    Family History:  Family History  Problem Relation Age of Onset   Unexplained death Mother 44       was on coumadin, reason not known   Heart attack Father 87   Heart disease Brother  had 2 stents in his 16s   Parkinson's disease Neg Hx     Medications:   Current Outpatient Medications on File Prior to Visit  Medication Sig Dispense Refill   apixaban (ELIQUIS) 5 MG TABS tablet TAKE 1 TABLET BY MOUTH TWICE DAILY 60 tablet 5   carbidopa-levodopa (SINEMET IR) 25-100 MG tablet Take one pill every 2.5 hours for a total of 6 to 7 times per day. 630 tablet 3   diltiazem (CARDIZEM CD) 180 MG 24 hr capsule TAKE 1 CAPSULE BY MOUTH DAILY 90 capsule 3   pramipexole (MIRAPEX) 1 MG tablet Take 1 tablet (1 mg total) by mouth 3 (three) times daily. 270 tablet 1   rasagiline (AZILECT) 1 MG TABS tablet Take 1 tablet (1 mg total) by mouth daily. 90 tablet 3   rosuvastatin (CRESTOR) 10 MG tablet Take 1 tablet (10 mg total) by mouth daily. If 10 mg is not tolerable due to muscle pain and stiffness, try taking 5 mg  every day or every other day. 90 tablet 3   tamsulosin (FLOMAX) 0.4 MG CAPS capsule SMARTSIG:1 Capsule(s) By Mouth Every Evening     No current facility-administered medications on file prior to visit.    Allergies:  No Known Allergies    OBJECTIVE:  Physical Exam  There were no vitals filed for this visit.  There is no height or weight on file to calculate BMI. No results found.   General: well developed, well nourished, pleasant elderly Caucasian male, seated, in no evident distress HEENT: head normocephalic and atraumatic.  Mild facial masking. Cardiovascular: regular rate and rhythm, no murmurs Musculoskeletal: no deformity Skin:  no rash/petichiae Vascular:  Normal pulses all extremities   Neurologic Exam Mental Status: Awake and fully alert.  Mild to moderately hypophonic.  Oriented to place and time. Recent and remote memory intact. Attention span, concentration and fund of knowledge appropriate. Mood and affect appropriate.  Cranial Nerves: Pupils equal, briskly reactive to light. Extraocular movements mild saccadic breakdown without nystagmus, some limitation to upper gaze. Visual fields full to confrontation. Hearing intact. Facial sensation intact. Face, tongue, palate moves normally and symmetrically.  Motor: Normal strength in all tested extremity muscles.  Mildly increased tone RUE with cogwheel rigidity.  Mild to moderate bradykinesia.  No evidence of tremor. Sensory.: intact to touch , pinprick , position and vibratory sensation.  Coordination: Rapid alternating movements mild to moderately impaired on the right, very slight on left. Finger-to-nose and heel-to-shin performed accurately bilaterally. Gait and Station: Arises from chair without difficulty.  Posture is mildly stooped. Gait demonstrates slightly decreased stride length and decreased arm swing on the right, mild difficulty with turns.  No use of AD. Reflexes: 1+ and symmetric. Toes downgoing.          ASSESSMENT/PLAN: MAHMUD Cobb is a 72 y.o. year old male   Parkinson disease, right sided predominant  Does have worsening of gait after about 2 hrs and 15 min with Sinemet, unable to tolerate taking every 2 hours Continue Sinemet, Mirapex and Azilect  Will further discuss with Dr. Rexene Alberts if there are any other recommendations to help with worsening symptoms in between dosages No benefit/intolerant with Sinemet CR  Please advise patient/wife Dr. Guadelupe Sabin recommendations, could consider increasing pramipexole to 1.5 mg 3 times daily which would be the maximum dose. If they are interested in increasing, please let me know and I will place a new order. Thank you.   Follow up in 6 months or call earlier  if needed    CC:  PCP: Cyndi Bender, PA-C   [facility] provider: Dr. Rexene Alberts  I spent 31 minutes of face-to-face and non-face-to-face time with patient and wife.  This included previsit chart review, lab review, study review, order entry, electronic health record documentation, patient education regarding above diagnoses and treatment plan and answered all other questions to patient and wife satisfaction   Frann Rider, AGNP-BC  [facility] [address] [place], [place] [address]  Phone [phone] Fax [phone] Note: This document was prepared with digital dictation and possible smart phrase technology. Any transcriptional errors that result from this process are unintentional.

## 2022-07-27 ENCOUNTER — Encounter: Payer: Self-pay | Admitting: Adult Health

## 2022-07-27 ENCOUNTER — Ambulatory Visit: Payer: Medicare Other | Admitting: Adult Health

## 2022-07-27 VITALS — BP 136/67 | HR 68 | Ht 70.0 in | Wt 169.5 lb

## 2022-07-27 DIAGNOSIS — G20B2 Parkinson's disease with dyskinesia, with fluctuations: Secondary | ICD-10-CM

## 2022-07-27 MED ORDER — PRAMIPEXOLE DIHYDROCHLORIDE 1.5 MG PO TABS: 1.5000 mg | ORAL_TABLET | Freq: Three times a day (TID) | ORAL | 5 refills | Status: DC

## 2022-07-27 NOTE — Patient Instructions (Addendum)
Your Plan:  Increase Mirapex to 1.5 mg three times daily  Continue Sinemet at current dosage  Continue Azilect at current dose    Follow up with Dr. Rexene Alberts in 3 months or call earlier if needed       Thank you for coming to see [place] at [facility]. I hope we have been able to provide you high quality care today.  You may receive a patient satisfaction survey over the next few weeks. We would appreciate your feedback and comments so that we may continue to improve ourselves and the health of our patients.

## 2022-08-18 DIAGNOSIS — H11153 Pinguecula, bilateral: Secondary | ICD-10-CM | POA: Diagnosis not present

## 2022-08-18 DIAGNOSIS — H5319 Other subjective visual disturbances: Secondary | ICD-10-CM | POA: Diagnosis not present

## 2022-08-18 DIAGNOSIS — H2513 Age-related nuclear cataract, bilateral: Secondary | ICD-10-CM | POA: Diagnosis not present

## 2022-08-18 DIAGNOSIS — H524 Presbyopia: Secondary | ICD-10-CM | POA: Diagnosis not present

## 2022-08-18 DIAGNOSIS — H52223 Regular astigmatism, bilateral: Secondary | ICD-10-CM | POA: Diagnosis not present

## 2022-09-03 ENCOUNTER — Emergency Department (HOSPITAL_COMMUNITY): Payer: Medicare Other

## 2022-09-03 ENCOUNTER — Emergency Department (HOSPITAL_COMMUNITY)
Admission: EM | Admit: 2022-09-03 | Discharge: 2022-09-03 | Disposition: A | Discharge status: Home / Self Care | Payer: Medicare Other | Attending: Emergency Medicine | Admitting: Emergency Medicine

## 2022-09-03 DIAGNOSIS — Z743 Need for continuous supervision: Secondary | ICD-10-CM | POA: Diagnosis not present

## 2022-09-03 DIAGNOSIS — I4891 Unspecified atrial fibrillation: Secondary | ICD-10-CM | POA: Insufficient documentation

## 2022-09-03 DIAGNOSIS — M549 Dorsalgia, unspecified: Secondary | ICD-10-CM | POA: Diagnosis not present

## 2022-09-03 DIAGNOSIS — Z7901 Long term (current) use of anticoagulants: Secondary | ICD-10-CM | POA: Insufficient documentation

## 2022-09-03 DIAGNOSIS — M5442 Lumbago with sciatica, left side: Secondary | ICD-10-CM

## 2022-09-03 DIAGNOSIS — R509 Fever, unspecified: Secondary | ICD-10-CM | POA: Diagnosis not present

## 2022-09-03 DIAGNOSIS — M545 Low back pain, unspecified: Secondary | ICD-10-CM | POA: Diagnosis not present

## 2022-09-03 MED ORDER — METHYLPREDNISOLONE 4 MG PO TBPK: ORAL_TABLET | ORAL | 0 refills | Status: DC

## 2022-09-03 MED ORDER — HYDROMORPHONE HCL 2 MG PO TABS
2.0000 mg | ORAL_TABLET | Freq: Once | ORAL | Status: AC
  Administered 2022-09-03: 2 mg via ORAL
  Filled 2022-09-03: qty 1

## 2022-09-03 MED ORDER — KETOROLAC TROMETHAMINE 15 MG/ML IJ SOLN
15.0000 mg | Freq: Once | INTRAMUSCULAR | Status: AC
  Administered 2022-09-03: 15 mg via INTRAVENOUS
  Filled 2022-09-03: qty 1

## 2022-09-03 MED ORDER — FENTANYL CITRATE PF 50 MCG/ML IJ SOSY
PREFILLED_SYRINGE | INTRAMUSCULAR | Status: AC
  Administered 2022-09-03: 50 ug via INTRAVENOUS
  Filled 2022-09-03: qty 1

## 2022-09-03 MED ORDER — FENTANYL CITRATE PF 50 MCG/ML IJ SOSY: 50.0000 ug | PREFILLED_SYRINGE | Freq: Once | INTRAMUSCULAR | Status: AC

## 2022-09-03 MED ORDER — DEXAMETHASONE SODIUM PHOSPHATE 10 MG/ML IJ SOLN
6.0000 mg | Freq: Once | INTRAMUSCULAR | Status: AC
  Administered 2022-09-03: 6 mg via INTRAVENOUS
  Filled 2022-09-03: qty 1

## 2022-09-03 MED ORDER — HYDROMORPHONE HCL 1 MG/ML IJ SOLN
1.0000 mg | Freq: Once | INTRAMUSCULAR | Status: AC
  Administered 2022-09-03: 1 mg via INTRAVENOUS
  Filled 2022-09-03: qty 1

## 2022-09-03 MED ORDER — HYDROMORPHONE HCL 2 MG PO TABS: 2.0000 mg | ORAL_TABLET | Freq: Four times a day (QID) | ORAL | 0 refills | Status: DC | PRN

## 2022-09-03 NOTE — ED Triage Notes (Signed)
Pt to the ed from home via ems with a CC of back pain x 2 days. Pt relay she has a HX of parkinson and had a surgery to relieve nerve pain. Pt relays he had been relatively pain free until sharp pain started in his lower back on Friday. Pt unable to walk because of the pain, pt denies any incontinents  or recent falls/ trauma.

## 2022-09-03 NOTE — Discharge Instructions (Addendum)
Follow-up with the neurosurgeon this week

## 2022-09-03 NOTE — ED Notes (Signed)
Pt transported to MRI 

## 2022-09-03 NOTE — ED Provider Notes (Signed)
[place] EMERGENCY DEPARTMENT AT [facility] Provider Note   CSN: [id number] Arrival date & time: 09/03/22  0865     History  Chief Complaint  Patient presents with   Back Pain    Jack Cobb is a 72 y.o. male with a history of marked disease, spinal surgery, paroxysmal A-fib on diltiazem and Eliquis, presented to ED with worsening low back pain.  Patient reports that he had lumbar spinal surgery performed with Dr Dutch Quint approx. 2 months ago (his records are not immediately visible on our medical system) for chronic back pain, and reports that he had relief of his back pain afterwards and was doing well until about 3 days ago.  He notes on Friday that he "tweaked my back" but denies any falls or trauma, and says it feels weak someone is jabbing a knife into his left back.  The pain radiates down his left leg to his heel.  It is worse with movement but present even at rest.  They resumed his pain medications with hydrocodone 5 mg every 6 hours for the past day but it is not providing enough relief.  He also takes Sinemet and is due for his morning medication and has not taken it yet according to his wife.  HPI     Home Medications Prior to Admission medications   Medication Sig Start Date End Date Taking? Authorizing Provider  apixaban (ELIQUIS) 5 MG TABS tablet TAKE 1 TABLET BY MOUTH TWICE DAILY 07/20/22   Quintella Reichert, MD  carbidopa-levodopa (SINEMET IR) 25-100 MG tablet Take one pill every 2.5 hours for a total of 6 to 7 times per day. 07/20/22   Huston Foley, MD  celecoxib (CELEBREX) 200 MG capsule Take 200 mg by mouth 2 (two) times daily as needed. 07/20/22   [provider]  diltiazem (CARDIZEM CD) 180 MG 24 hr capsule TAKE 1 CAPSULE BY MOUTH DAILY 07/20/22   Quintella Reichert, MD  pramipexole (MIRAPEX) 1.5 MG tablet Take 1 tablet (1.5 mg total) by mouth 3 (three) times daily. 07/27/22   [name], NP  rasagiline (AZILECT) 1 MG TABS tablet Take 1 tablet (1  mg total) by mouth daily. 10/24/21   Huston Foley, MD  rosuvastatin (CRESTOR) 10 MG tablet Take 1 tablet (10 mg total) by mouth daily. If 10 mg is not tolerable due to muscle pain and stiffness, try taking 5 mg every day or every other day. 05/25/22   Quintella Reichert, MD  tamsulosin (FLOMAX) 0.4 MG CAPS capsule SMARTSIG:1 Capsule(s) By Mouth Every Evening 01/06/22   [provider]      Allergies    Patient has no known allergies.    Review of Systems   Review of Systems  Physical Exam Updated Vital Signs BP (!) 142/72 (BP Location: Right Arm)   Pulse 67   Temp 97.8 F (36.6 C) (Oral)   Resp (!) 26   Ht  (1.778 m)   Wt 83.9 kg   SpO2 100%   BMI 26.54 kg/m  Physical Exam Constitutional:      General: He is not in acute distress. HENT:     Head: Normocephalic and atraumatic.  Eyes:     Conjunctiva/sclera: Conjunctivae normal.     Pupils: Pupils are equal, round, and reactive to light.  Cardiovascular:     Rate and Rhythm: Normal rate and regular rhythm.  Pulmonary:     Effort: Pulmonary effort is normal. No respiratory distress.  Musculoskeletal:  Comments: Lying on the bed with left leg extended, pain with movement or range of motion testing of the left leg, tenderness around the lumbar spine, no visible erythema or edema of the lumbar spine  Skin:    General: Skin is warm and dry.  Neurological:     General: No focal deficit present.     Mental Status: He is alert and oriented to person, place, and time. Mental status is at baseline.     Sensory: No sensory deficit.     Motor: No weakness.     Comments: No saddle anesthesia  Psychiatric:        Mood and Affect: Mood normal.        Behavior: Behavior normal.     ED Results / Procedures / Treatments   Labs (all labs ordered are listed, but only abnormal results are displayed) Labs Reviewed - No data to display  EKG None  Radiology No results found.  Procedures Procedures    Medications  Ordered in ED Medications  HYDROmorphone (DILAUDID) injection 1 mg (has no administration in time range)  ketorolac (TORADOL) 15 MG/ML injection 15 mg (has no administration in time range)  dexamethasone (DECADRON) injection 6 mg (has no administration in time range)    ED Course/ Medical Decision Making/ A&P Clinical Course as of 09/03/22 1759  Sun Sep 03, 2022  1053 Patient reassessed and the pain has improved in his hip and his lower back.  However he still uncomfortable, and given his recent reported surgery, I think an MRI of the lumbar spine would be reasonable at this point.  [MT]  1556 Pt signed out to Dr Freida Busman EDP [MT]    Clinical Course User Index [MT] Renaye Rakers Kermit Balo, MD                             Medical Decision Making Amount and/or Complexity of Data Reviewed Radiology: ordered.  Risk Prescription drug management.   Patient is here with low back pain that radiates into his left leg.  This is most consistent with a lumbar radiculopathy.  This may be due to nerve root impingement, particularly given its sudden onset 2 days ago.  It seems less likely an issue of a expanding postoperative hematoma now 2 months after his operation, and I do not see any evidence otherwise of developing infection.  Patient was given IV pain medications with 1 mg dilaudid as well as a dose of Toradol, single dose here in the ED as he is otherwise on Eliquis at home, as well as IV steroids.  His wife did provide him his home Sinemet at the bedside.  I do not see any red flags for cauda equina syndrome at this time, including saddle anesthesia or urinary incontinence or retention.        Final Clinical Impression(s) / ED Diagnoses Final diagnoses:  None    Rx / DC Orders ED Discharge Orders     None         Charlisha Market, Kermit Balo, MD 09/03/22 1800

## 2022-09-03 NOTE — ED Provider Notes (Signed)
Patient signed to me pending results of MRI of back.  Scans reviewed in consult with Dr. Maisie Fus from neurosurgery.  He states no acute intervention required at this time and that patient can follow-up with his office.  Family notified and prior provider called in pain medication   Lorre Nick, MD 09/03/22 1717

## 2022-09-06 ENCOUNTER — Telehealth: Payer: Self-pay

## 2022-09-06 NOTE — Telephone Encounter (Signed)
     Patient  visit on 4/14  at    Have you been able to follow up with your primary care physician? Yes   The patient was or was not able to obtain any needed medicine or equipment. Yes   Are there diet recommendations that you are having difficulty following? Na   Patient expresses understanding of discharge instructions and education provided has no other needs at this time.  Yes      Jack Cobb [facility] Care Guide, [place] [phone] [address], [place], [place] [address] Phone: [phone] Email: [email or web address]    

## 2022-09-07 DIAGNOSIS — M5126 Other intervertebral disc displacement, lumbar region: Secondary | ICD-10-CM | POA: Diagnosis not present

## 2022-09-08 ENCOUNTER — Telehealth: Payer: Self-pay | Admitting: *Deleted

## 2022-09-08 NOTE — Telephone Encounter (Signed)
   Pre-operative Risk Assessment    Patient Name: Jack Cobb  DOB: [birth year] MRN: [id number]      Request for Surgical Clearance    Procedure:   L4-5 LUMBAR MICRODISKECTOMY   Date of Surgery:  Clearance TBD                                 Surgeon:  DR. Julio Sicks  Surgeon's Group or Practice Name:  [facility] Phone number:  [phone]  Fax number:  [phone] ATTN: [place] [address] EXT 244   Type of Clearance Requested:   - Medical  - Pharmacy:  Hold Apixaban (Eliquis)     Type of Anesthesia:  General    Additional requests/questions:    Elpidio Anis   09/08/2022, 1:02 PM

## 2022-09-11 ENCOUNTER — Other Ambulatory Visit: Payer: Self-pay | Admitting: Neurosurgery

## 2022-09-11 ENCOUNTER — Telehealth: Payer: Self-pay | Admitting: *Deleted

## 2022-09-11 NOTE — Telephone Encounter (Signed)
Pt has been scheduled for tele pre op appt 09/15/22 @ 10 am. Med rec and consent are done.

## 2022-09-11 NOTE — Telephone Encounter (Signed)
Primary Cardiologist:Traci Mayford Knife, MD   Preoperative team, please contact this patient and set up a phone call appointment for further preoperative risk assessment. Please obtain consent and complete medication review. Thank you for your help.   I confirm that guidance regarding antiplatelet and oral anticoagulation therapy has been completed and, if necessary, noted below.   Levi Aland, NP-C  09/11/2022, 10:06 AM [address] Office [phone] Fax [phone]

## 2022-09-11 NOTE — Telephone Encounter (Signed)
Pt has been scheduled for tele pre op appt 09/15/22 @ 10 am. Med rec and consent are done.     Patient Consent for Virtual Visit        Jack Cobb has provided verbal consent on 09/11/2022 for a virtual visit (video or telephone).   CONSENT FOR VIRTUAL VISIT FOR:  Jack Cobb  By participating in this virtual visit I agree to the following:  I hereby voluntarily request, consent and authorize [facility] and its employed or contracted physicians, physician assistants, nurse practitioners or other licensed health care professionals (the Practitioner), to provide me with telemedicine health care services (the "Services") as deemed necessary by the treating Practitioner. I acknowledge and consent to receive the Services by the Practitioner via telemedicine. I understand that the telemedicine visit will involve communicating with the Practitioner through live audiovisual communication technology and the disclosure of certain medical information by electronic transmission. I acknowledge that I have been given the opportunity to request an in-person assessment or other available alternative prior to the telemedicine visit and am voluntarily participating in the telemedicine visit.  I understand that I have the right to withhold or withdraw my consent to the use of telemedicine in the course of my care at any time, without affecting my right to future care or treatment, and that the Practitioner or I may terminate the telemedicine visit at any time. I understand that I have the right to inspect all information obtained and/or recorded in the course of the telemedicine visit and may receive copies of available information for a reasonable fee.  I understand that some of the potential risks of receiving the Services via telemedicine include:  Delay or interruption in medical evaluation due to technological equipment failure or disruption; Information transmitted may not be sufficient  (e.g. poor resolution of images) to allow for appropriate medical decision making by the Practitioner; and/or  In rare instances, security protocols could fail, causing a breach of personal health information.  Furthermore, I acknowledge that it is my responsibility to provide information about my medical history, conditions and care that is complete and accurate to the best of my ability. I acknowledge that Practitioner's advice, recommendations, and/or decision may be based on factors not within their control, such as incomplete or inaccurate data provided by me or distortions of diagnostic images or specimens that may result from electronic transmissions. I understand that the practice of medicine is not an exact science and that Practitioner makes no warranties or guarantees regarding treatment outcomes. I acknowledge that a copy of this consent can be made available to me via my patient portal [facility] MyChart), or I can request a printed copy by calling the office of [facility].    I understand that my insurance will be billed for this visit.   I have read or had this consent read to me. I understand the contents of this consent, which adequately explains the benefits and risks of the Services being provided via telemedicine.  I have been provided ample opportunity to ask questions regarding this consent and the Services and have had my questions answered to my satisfaction. I give my informed consent for the services to be provided through the use of telemedicine in my medical care

## 2022-09-11 NOTE — Telephone Encounter (Signed)
Pt's wife had put me on hold  and when she came back she tells me that was the surgeon's office who just called her as well and they have now scheduled the pt's surgery for 09/19/22. Pt's wife states the pt is in such pain. I did move tele appt up 09/14/22 @ 10:40. Pt's wife thanked me for the help. I will update all parties involved.

## 2022-09-11 NOTE — Telephone Encounter (Signed)
Patient with diagnosis of afib on Eliquis for anticoagulation.     Procedure: L4-5 Lumbar laminectomy Date of procedure: TBD   CHA2DS2-VASc Score = 3  This indicates a 3.2% annual risk of stroke. The patient's score is based upon: CHF History: 0 HTN History: 1 Diabetes History: 0 Stroke History: 0 Vascular Disease History: 1 Age Score: 1 Gender Score: 0   CrCl >95mL/min (06/12/2022 Sr.Cr 0.79) Platelet count 238 K   Per office protocol, patient can hold Eliquis for 3 days prior to procedure.

## 2022-09-12 ENCOUNTER — Telehealth: Payer: Self-pay | Admitting: Cardiology

## 2022-09-12 NOTE — Telephone Encounter (Signed)
I apologize I forgot to cancel the 09/15/22 appt. I have now cancelled 09/15/22 as we moved appt yesterday to 09/14/22.

## 2022-09-12 NOTE — Progress Notes (Signed)
Surgical Instructions    Your procedure is scheduled on Tuesday, 09/19/22.  Report to [facility] Main Entrance "A" at 6:00 A.M., then check in with the Admitting office.  Call this number if you have problems the morning of surgery:  [phone]   If you have any questions prior to your surgery date call [phone]: Open Monday-Friday 8am-4pm If you experience any cold or flu symptoms such as cough, fever, chills, shortness of breath, etc. between now and your scheduled surgery, please notify us at the above number     Remember:  Do not eat or drink  after midnight the night before your surgery     Take these medicines the morning of surgery with A SIP OF WATER:  carbidopa-levodopa (SINEMET IR)  diltiazem (CARDIZEM CD)  gabapentin (NEURONTIN)  methylPREDNISolone (MEDROL DOSEPAK)  pramipexole (MIRAPEX)  rasagiline (AZILECT)  rosuvastatin (CRESTOR)   IF NEEDED: HYDROmorphone (DILAUDID)   As of today, STOP taking any Aspirin (unless otherwise instructed by your surgeon) Aleve, Naproxen, Ibuprofen, Motrin, Advil, Goody's, BC's, all herbal medications, celecoxib (CELEBREX), fish oil, and all vitamins.  Stop taking apixaban (ELIQUIS) 3 days prior to surgery. Your last dose will be 09/15/22.           Do not wear jewelry or makeup. Do not wear lotions, powders, cologne or deodorant. Men may shave face and neck. Do not bring valuables to the hospital. Do not wear nail polish, gel polish, artificial nails, or any other type of covering on natural nails (fingers and toes) If you have artificial nails or gel coating that need to be removed by a nail salon, please have this removed prior to surgery. Artificial nails or gel coating may interfere with anesthesia's ability to adequately monitor your vital signs.  [facility] is not responsible for any belongings or valuables.    Do NOT Smoke (Tobacco/Vaping)  24 hours prior to your procedure  If you use a CPAP at night, you may bring  your mask for your overnight stay.   Contacts, glasses, hearing aids, dentures or partials may not be worn into surgery, please bring cases for these belongings   For patients admitted to the hospital, discharge time will be determined by your treatment team.   Patients discharged the day of surgery will not be allowed to drive home, and someone needs to stay with them for 24 hours.   SURGICAL WAITING ROOM VISITATION Patients having surgery or a procedure may have no more than 2 support people in the waiting area - these visitors may rotate.   Children under the age of 21 must have an adult with them who is not the patient. If the patient needs to stay at the hospital during part of their recovery, the visitor guidelines for inpatient rooms apply. Pre-op nurse will coordinate an appropriate time for 1 support person to accompany patient in pre-op.  This support person may not rotate.   Please refer to [email or web address] for the visitor guidelines for Inpatients (after your surgery is over and you are in a regular room).    Special instructions:    Oral Hygiene is also important to reduce your risk of infection.  Remember - BRUSH YOUR TEETH THE MORNING OF SURGERY WITH YOUR REGULAR TOOTHPASTE   [facility]- Preparing For Surgery  Before surgery, you can play an important role. Because skin is not sterile, your skin needs to be as free of germs as possible. You can reduce the number of germs on your skin by  washing with CHG (chlorahexidine gluconate) Soap before surgery.  CHG is an antiseptic cleaner which kills germs and bonds with the skin to continue killing germs even after washing.     Please do not use if you have an allergy to CHG or antibacterial soaps. If your skin becomes reddened/irritated stop using the CHG.  Do not shave (including legs and underarms) for at least 48 hours prior to first CHG shower. It is OK to shave your  face.  Please follow these CHG instructions carefully.     Day of Surgery: Take a shower with CHG soap. Wear Clean/Comfortable clothing the morning of surgery Do not apply any deodorants/lotions.   Remember to brush your teeth WITH YOUR REGULAR TOOTHPASTE.    If you received a COVID test during your pre-op visit, it is requested that you wear a mask when out in public, stay away from anyone that may not be feeling well, and notify your surgeon if you develop symptoms. If you have been in contact with anyone that has tested positive in the last 10 days, please notify your surgeon.    Please read over the following fact sheets that you were given.

## 2022-09-12 NOTE — Telephone Encounter (Signed)
Wife stated patient will keep the tele-visit on 4/25 at 10:40 am and wants to cancel the tele-visit on 4/26 at 10:00 am.

## 2022-09-13 ENCOUNTER — Encounter (HOSPITAL_COMMUNITY)
Admission: RE | Admit: 2022-09-13 | Discharge: 2022-09-13 | Disposition: A | Discharge status: Home / Self Care | Payer: Medicare Other | Source: Ambulatory Visit | Attending: Neurosurgery | Admitting: Neurosurgery

## 2022-09-13 ENCOUNTER — Other Ambulatory Visit: Payer: Self-pay

## 2022-09-13 ENCOUNTER — Encounter (HOSPITAL_COMMUNITY): Payer: Self-pay

## 2022-09-13 VITALS — BP 154/70 | Temp 98.3°F | Resp 16 | Ht 70.0 in | Wt 166.7 lb

## 2022-09-13 DIAGNOSIS — I480 Paroxysmal atrial fibrillation: Secondary | ICD-10-CM | POA: Diagnosis not present

## 2022-09-13 DIAGNOSIS — Z01818 Encounter for other preprocedural examination: Secondary | ICD-10-CM

## 2022-09-13 DIAGNOSIS — E785 Hyperlipidemia, unspecified: Secondary | ICD-10-CM | POA: Diagnosis not present

## 2022-09-13 DIAGNOSIS — I2510 Atherosclerotic heart disease of native coronary artery without angina pectoris: Secondary | ICD-10-CM | POA: Insufficient documentation

## 2022-09-13 DIAGNOSIS — I10 Essential (primary) hypertension: Secondary | ICD-10-CM | POA: Diagnosis not present

## 2022-09-13 LAB — BASIC METABOLIC PANEL
Anion gap: 8 (ref 5–15)
BUN: 12 mg/dL (ref 8–23)
CO2: 27 mmol/L (ref 22–32)
Calcium: 9 mg/dL (ref 8.9–10.3)
Chloride: 101 mmol/L (ref 98–111)
Creatinine, Ser: 0.71 mg/dL (ref 0.61–1.24)
GFR, Estimated: >60 mL/min (ref >60)
Glucose, Bld: 119 mg/dL — ABNORMAL HIGH (ref 70–99)
Potassium: 3.9 mmol/L (ref 3.5–5.1)
Sodium: 136 mmol/L (ref 135–145)

## 2022-09-13 LAB — SURGICAL PCR SCREEN
MRSA, PCR: NEGATIVE
Staphylococcus aureus: NEGATIVE

## 2022-09-13 LAB — CBC
HCT: 47 % (ref 39.0–52.0)
Hemoglobin: 15.9 g/dL (ref 13.0–17.0)
MCH: 31.3 pg (ref 26.0–34.0)
MCHC: 33.8 g/dL (ref 30.0–36.0)
MCV: 92.5 fL (ref 80.0–100.0)
Platelets: 256 10*3/uL (ref 150–400)
RBC: 5.08 MIL/uL (ref 4.22–5.81)
RDW: 13 % (ref 11.5–15.5)
WBC: 8 10*3/uL (ref 4.0–10.5)
nRBC: 0 % (ref 0.0–0.2)

## 2022-09-13 NOTE — Progress Notes (Signed)
PCP - Lonie Peak, pa-c Cardiologist - traci turner Neurology: Huston Foley  PPM/ICD - denies  Chest x-ray - n/a EKG - 03/23/22 Stress Test - 06/29/21 ECHO - 06/13/20 Cardiac Cath - denies  Stop eliquis 3 days prior to surgery  ERAS Protcol -no   COVID TEST- not needed   Anesthesia review: yes, review cardiac clearance. Pt has tele visit with turner tomorrow 09/14/22 to receive final clearance.   Patient denies shortness of breath, fever, cough and chest pain at PAT appointment   All instructions explained to the patient, with a verbal understanding of the material. Patient agrees to go over the instructions while at home for a better understanding. Patient also instructed to self quarantine after being tested for COVID-19. The opportunity to ask questions was provided.

## 2022-09-14 ENCOUNTER — Ambulatory Visit: Payer: Medicare Other | Attending: Cardiology | Admitting: Nurse Practitioner

## 2022-09-14 DIAGNOSIS — Z01810 Encounter for preprocedural cardiovascular examination: Secondary | ICD-10-CM

## 2022-09-14 NOTE — Anesthesia Preprocedure Evaluation (Addendum)
Anesthesia Evaluation  Patient identified by MRN, date of birth, ID band Patient awake    Reviewed: Allergy & Precautions, NPO status , Patient's Chart, lab work & pertinent test results  Airway Mallampati: II  TM Distance: >3 FB Neck ROM: Full    Dental no notable dental hx.    Pulmonary neg pulmonary ROS   Pulmonary exam normal breath sounds clear to auscultation       Cardiovascular hypertension, Pt. on medications + CAD (mid LAD 75-99% on coronary CTA 2022)  Normal cardiovascular exam+ dysrhythmias (eliquis LD 4/26) Atrial Fibrillation  Rhythm:Regular Rate:Normal  Nuclear stress 06/29/2021:   Findings are consistent with no prior ischemia. The study is low risk.   No ST deviation was noted.   LV perfusion is abnormal. Defect 1: There is a small defect with mild reduction in uptake present in the mid to basal inferior location(s) that is partially reversible. There is normal wall motion in the defect area. Consistent with artifact caused by subdiaphragmatic activity.   Left ventricular function is normal. Nuclear stress EF: 59 %. The left ventricular ejection fraction is normal (55-65%). End diastolic cavity size is mildly enlarged. End systolic cavity size is normal.   Prior study not available for comparison.   Small size, mild intensity partially reversible basal to mid inferior perfusion defect, improved with stress upright imaging, suggestive of attenuation artifact. LVEF 59%, dilated LV with normal wall motion. This is a low risk study. No prior for comparison.    Neuro/Psych HNP PD  negative psych ROS   GI/Hepatic negative GI ROS, Neg liver ROS,,,  Endo/Other  negative endocrine ROS    Renal/GU negative Renal ROS  negative genitourinary   Musculoskeletal negative musculoskeletal ROS (+)    Abdominal   Peds  Hematology  (+) Blood dyscrasia (Eliquis) Hb 15.9   Anesthesia Other Findings Day of surgery  medications reviewed with the patient.  Reproductive/Obstetrics negative OB ROS                             Anesthesia Physical Anesthesia Plan  ASA: 3  Anesthesia Plan: General   Post-op Pain Management: Tylenol PO (pre-op)*   Induction: Intravenous  PONV Risk Score and Plan: 2 and Dexamethasone and Ondansetron  Airway Management Planned: Oral ETT  Additional Equipment:   Intra-op Plan:   Post-operative Plan: Extubation in OR  Informed Consent: I have reviewed the patients History and Physical, chart, labs and discussed the procedure including the risks, benefits and alternatives for the proposed anesthesia with the patient or authorized representative who has indicated his/her understanding and acceptance.     Dental advisory given  Plan Discussed with: CRNA and Anesthesiologist  Anesthesia Plan Comments: (PAT note by Antionette Poles, PA-C: Follows cardiology for history of CAD, paroxysmal atrial fibrillation, HTN, HLD.  Seen by Bernadene Person, NP 09/14/2022 for preop evaluation.  Per note, "According to the Revised Cardiac Risk Index (RCRI), his Perioperative Risk of Major Cardiac Event is (%): 0.9. His Functional Capacity in METs is: 8.97 according to the Duke Activity Status Index (DASI). Therefore, based on ACC/AHA guidelines, patient would be at acceptable risk for the planned procedure without further cardiovascular testing. The patient was advised that if he develops new symptoms prior to surgery to contact our office to arrange for a follow-up visit, and he verbalized understanding. Per office protocol, patient can hold Eliquis for 3 days prior to procedure.  Please resume Eliquis as soon  as possible postprocedure, at the discretion of the surgeon."  Follows with neurology for history of right-sided predominant Parkinson's disease.  He is maintained on Azilect, Mirapex, and pramipexole.  Last seen 07/27/2022.  Pramipexole was increased to 1.5 mg 3 times  daily at that time.  EKG 03/23/2022: Sinus bradycardia with marked sinus arrhythmia.  Rate 59.  Minimal voltage criteria for LVH, may be normal variant.  Nuclear stress 06/29/2021:   Findings are consistent with no prior ischemia. The study is low risk.   No ST deviation was noted.   LV perfusion is abnormal. Defect 1: There is a small defect with mild reduction in uptake present in the mid to basal inferior location(s) that is partially reversible. There is normal wall motion in the defect area. Consistent with artifact caused by subdiaphragmatic activity.   Left ventricular function is normal. Nuclear stress EF: 59 %. The left ventricular ejection fraction is normal (55-65%). End diastolic cavity size is mildly enlarged. End systolic cavity size is normal.   Prior study not available for comparison.  Small size, mild intensity partially reversible basal to mid inferior perfusion defect, improved with stress upright imaging, suggestive of attenuation artifact. LVEF 59%, dilated LV with normal wall motion. This is a low risk study. No prior for comparison.  Coronary CT with FFR analysis 06/24/2020: 1. Left Main: Normal  2. LAD: Abnormal: Proximal 0.97, mid 0.80, distal 0.74. First diagonal 0.70.  3. LCX: Normal: Proximal 0.95, mid 0.9  4. Ramus: n/a  5. RCA: Normal: Proximal 0.98, mid 0.94, distal 0.90  IMPRESSION: 1. Abnormal LAD FFRct suggesting hemodynamically significant lesion in the mid LAD territory and well as first diagonal. Recommend cardiac catheterization.  -Regarding his results, Dr. Excell Seltzer evaluated the patient on 06/28/2020 and commented, "The patient has coronary artery disease with FFR positive stenosis in a diagonal branch of the LAD. He has a lesion in the mid LAD that by my review does not appear hemodynamically significant based on FFR evaluation in the mid vessel. The apical LAD FFR is abnormal but this really has not correlated well with significant stenosis  based on our experience with invasive cardiac catheterization. And an absence of any anginal chest discomfort, I think medical therapy is indicated.   TTE 06/13/2020: 1. Left ventricular ejection fraction, by estimation, is 60 to 65%. The  left ventricle has normal function. The left ventricle has no regional  wall motion abnormalities. Left ventricular diastolic function could not  be evaluated.  2. Right ventricular systolic function is normal. The right ventricular  size is normal. Tricuspid regurgitation signal is inadequate for assessing  PA pressure.  3. Left atrial size was mildly dilated.  4. The mitral valve is normal in structure. No evidence of mitral valve  regurgitation. No evidence of mitral stenosis.  5. The aortic valve is tricuspid. Aortic valve regurgitation is not  visualized. Mild aortic valve sclerosis is present, with no evidence of  aortic valve stenosis.  6. Aortic dilatation noted. There is mild dilatation of the aortic root,  measuring 38 mm.  7. The inferior vena cava is normal in size with greater than 50%  respiratory variability, suggesting right atrial pressure of 3 mmHg.    )        Anesthesia Quick Evaluation

## 2022-09-14 NOTE — Progress Notes (Signed)
Anesthesia Chart Review:  Follows cardiology for history of CAD, paroxysmal atrial fibrillation, HTN, HLD.  Seen by Bernadene Person, NP 09/14/2022 for preop evaluation.  Per note, "According to the Revised Cardiac Risk Index (RCRI), his Perioperative Risk of Major Cardiac Event is (%): 0.9. His Functional Capacity in METs is: 8.97 according to the Duke Activity Status Index (DASI). Therefore, based on ACC/AHA guidelines, patient would be at acceptable risk for the planned procedure without further cardiovascular testing. The patient was advised that if he develops new symptoms prior to surgery to contact our office to arrange for a follow-up visit, and he verbalized understanding. Per office protocol, patient can hold Eliquis for 3 days prior to procedure.  Please resume Eliquis as soon as possible postprocedure, at the discretion of the surgeon."  Follows with neurology for history of right-sided predominant Parkinson's disease.  He is maintained on Azilect, Mirapex, and pramipexole.  Last seen 07/27/2022.  Pramipexole was increased to 1.5 mg 3 times daily at that time.  EKG 03/23/2022: Sinus bradycardia with marked sinus arrhythmia.  Rate 59.  Minimal voltage criteria for LVH, may be normal variant.  Nuclear stress 06/29/2021:   Findings are consistent with no prior ischemia. The study is low risk.   No ST deviation was noted.   LV perfusion is abnormal. Defect 1: There is a small defect with mild reduction in uptake present in the mid to basal inferior location(s) that is partially reversible. There is normal wall motion in the defect area. Consistent with artifact caused by subdiaphragmatic activity.   Left ventricular function is normal. Nuclear stress EF: 59 %. The left ventricular ejection fraction is normal (55-65%). End diastolic cavity size is mildly enlarged. End systolic cavity size is normal.   Prior study not available for comparison.   Small size, mild intensity partially reversible basal to mid  inferior perfusion defect, improved with stress upright imaging, suggestive of attenuation artifact. LVEF 59%, dilated LV with normal wall motion. This is a low risk study. No prior for comparison.  Coronary CT with FFR analysis 06/24/2020: 1. Left Main: Normal   2. LAD: Abnormal: Proximal 0.97, mid 0.80, distal 0.74. First diagonal 0.70.   3. LCX: Normal: Proximal 0.95, mid 0.9   4. Ramus: n/a   5. RCA: Normal: Proximal 0.98, mid 0.94, distal 0.90   IMPRESSION: 1. Abnormal LAD FFRct suggesting hemodynamically significant lesion in the mid LAD territory and well as first diagonal. Recommend cardiac catheterization.  -Regarding his results, Dr. Excell Seltzer evaluated the patient on 06/28/2020 and commented, "The patient has coronary artery disease with FFR positive stenosis in a diagonal branch of the LAD. He has a lesion in the mid LAD that by my review does not appear hemodynamically significant based on FFR evaluation in the mid vessel. The apical LAD FFR is abnormal but this really has not correlated well with significant stenosis based on our experience with invasive cardiac catheterization. And an absence of any anginal chest discomfort, I think medical therapy is indicated.   TTE 06/13/2020:  1. Left ventricular ejection fraction, by estimation, is 60 to 65%. The  left ventricle has normal function. The left ventricle has no regional  wall motion abnormalities. Left ventricular diastolic function could not  be evaluated.   2. Right ventricular systolic function is normal. The right ventricular  size is normal. Tricuspid regurgitation signal is inadequate for assessing  PA pressure.   3. Left atrial size was mildly dilated.   4. The mitral valve is normal  in structure. No evidence of mitral valve  regurgitation. No evidence of mitral stenosis.   5. The aortic valve is tricuspid. Aortic valve regurgitation is not  visualized. Mild aortic valve sclerosis is present, with no evidence of   aortic valve stenosis.   6. Aortic dilatation noted. There is mild dilatation of the aortic root,  measuring 38 mm.   7. The inferior vena cava is normal in size with greater than 50%  respiratory variability, suggesting right atrial pressure of 3 mmHg.     Zannie Cove [facility] Short Stay Center/Anesthesiology Phone [phone] 09/14/2022 12:48 PM

## 2022-09-14 NOTE — Progress Notes (Signed)
Virtual Visit via Telephone Note   Because of Jack Cobb's co-morbid illnesses, he is at least at moderate risk for complications without adequate follow up.  This format is felt to be most appropriate for this patient at this time.  The patient did not have access to video technology/had technical difficulties with video requiring transitioning to audio format only (telephone).  All issues noted in this document were discussed and addressed.  No physical exam could be performed with this format.  Please refer to the patient's chart for his consent to telehealth for [facility].  Evaluation Performed:  Preoperative cardiovascular risk assessment _____________   Date:  09/14/2022   Patient ID:  Jack Cobb, DOB Nov 03, 1950, MRN [id number] Patient Location:  Home Provider location:   Office  Primary Care Provider:  Lonie Peak, PA-C Primary Cardiologist:  Armanda Magic, MD  Chief Complaint / Patient Profile   72 y.o. y/o male with a h/o CAD, paroxysmal atrial fibrillation, hypertension, hyperlipidemia, and Parkinson's disease who is pending L4-5 lumbar microdiskectomy with Dr. Julio Sicks of [facility] and presents today for telephonic preoperative cardiovascular risk assessment.  History of Present Illness    Jack Cobb is a 72 y.o. male who presents via audio/video conferencing for a telehealth visit today.  Pt was last seen in cardiology clinic on 03/23/2022 by Dr. Mayford Knife.  At that time Jack Cobb was doing well.  The patient is now pending procedure as outlined above. Since his last visit, he has done well from a cardiac standpoint.   He denies chest pain, palpitations, dyspnea, pnd, orthopnea, n, v, dizziness, syncope, edema, weight gain, or early satiety. All other systems reviewed and are otherwise negative except as noted above.   Past Medical History    Past Medical History:  Diagnosis Date   CAD (coronary artery disease),  native coronary artery 09/06/2020   Coronary CTA was done showing a coronary calcium score of 201 placing him at the 60th percentile for age and gender.  He was noted to have 75 to 99% stenosis of the mid LAD with scattered nonobstructive plaquing elsewhere.  FFR 0.7 in D1 and 0.73 at the apex>>medical management   HLD (hyperlipidemia) 09/06/2020   Hypertension    Parkinson disease    Past Surgical History:  Procedure Laterality Date   BACK SURGERY     2019.2023, january 2024 lumbar surgeries with dr. pool   HERNIA REPAIR     ventral hernia repair with mesh x2    Allergies  No Known Allergies  Home Medications    Prior to Admission medications   Medication Sig Start Date End Date Taking? Authorizing Provider  apixaban (ELIQUIS) 5 MG TABS tablet TAKE 1 TABLET BY MOUTH TWICE DAILY 07/20/22   Quintella Reichert, MD  carbidopa-levodopa (SINEMET IR) 25-100 MG tablet Take one pill every 2.5 hours for a total of 6 to 7 times per day. 07/20/22   Huston Foley, MD  Cholecalciferol (VITAMIN D3 PO) Take 1 tablet by mouth daily.    [provider]  Coenzyme Q10 (CO Q10 PO) Take 1 capsule by mouth daily.    [provider]  diltiazem (CARDIZEM CD) 180 MG 24 hr capsule TAKE 1 CAPSULE BY MOUTH DAILY 07/20/22   Quintella Reichert, MD  gabapentin (NEURONTIN) 300 MG capsule Take 300 mg by mouth 2 (two) times daily.    [provider]  HYDROmorphone (DILAUDID) 2 MG tablet Take 1 tablet (2 mg  total) by mouth every 6 (six) hours as needed for up to 12 doses for severe pain. 09/03/22   Terald Sleeper, MD  methylPREDNISolone (MEDROL DOSEPAK) 4 MG TBPK tablet Use as directed Patient not taking: Reported on 09/12/2022 09/04/22   Terald Sleeper, MD  pramipexole (MIRAPEX) 1.5 MG tablet Take 1 tablet (1.5 mg total) by mouth 3 (three) times daily. Patient taking differently: Take 1-1.5 mg by mouth See admin instructions. Take 1.5 mg by mouth in morning and afternoon and take 1 mg at bedtime  07/27/22   [name], NP  rasagiline (AZILECT) 1 MG TABS tablet Take 1 tablet (1 mg total) by mouth daily. 10/24/21   Huston Foley, MD  rosuvastatin (CRESTOR) 10 MG tablet Take 1 tablet (10 mg total) by mouth daily. If 10 mg is not tolerable due to muscle pain and stiffness, try taking 5 mg every day or every other day. Patient not taking: Reported on 09/12/2022 05/25/22   Quintella Reichert, MD  tamsulosin (FLOMAX) 0.4 MG CAPS capsule Take 0.4 mg by mouth at bedtime. 01/06/22   [provider]    Physical Exam    Vital Signs:  Jack Cobb does not have vital signs available for review today.  Given telephonic nature of communication, physical exam is limited. AAOx3. NAD. Normal affect.  Speech and respirations are unlabored.  Accessory Clinical Findings    None  Assessment & Plan    1.  Preoperative Cardiovascular Risk Assessment:  According to the Revised Cardiac Risk Index (RCRI), his Perioperative Risk of Major Cardiac Event is (%): 0.9. His Functional Capacity in METs is: 8.97 according to the Duke Activity Status Index (DASI). Therefore, based on ACC/AHA guidelines, patient would be at acceptable risk for the planned procedure without further cardiovascular testing.  The patient was advised that if he develops new symptoms prior to surgery to contact our office to arrange for a follow-up visit, and he verbalized understanding.  Per office protocol, patient can hold Eliquis for 3 days prior to procedure.  Please resume Eliquis as soon as possible postprocedure, at the discretion of the surgeon.    A copy of this note will be routed to requesting surgeon.  Time:   Today, I have spent 5 minutes with the patient with telehealth technology discussing medical history, symptoms, and management plan.     Joylene Grapes, NP  09/14/2022, 10:51 AM

## 2022-09-15 ENCOUNTER — Ambulatory Visit: Payer: Medicare Other

## 2022-09-19 ENCOUNTER — Encounter (HOSPITAL_COMMUNITY): Payer: Self-pay | Admitting: Neurosurgery

## 2022-09-19 ENCOUNTER — Encounter (HOSPITAL_COMMUNITY)
Admission: RE | Disposition: A | Discharge status: Home / Self Care | Payer: Self-pay | Source: Home / Self Care | Attending: Neurosurgery

## 2022-09-19 ENCOUNTER — Ambulatory Visit (HOSPITAL_COMMUNITY): Payer: Medicare Other

## 2022-09-19 ENCOUNTER — Ambulatory Visit (HOSPITAL_BASED_OUTPATIENT_CLINIC_OR_DEPARTMENT_OTHER): Payer: Medicare Other | Admitting: Anesthesiology

## 2022-09-19 ENCOUNTER — Other Ambulatory Visit: Payer: Self-pay

## 2022-09-19 ENCOUNTER — Ambulatory Visit (HOSPITAL_COMMUNITY): Payer: Medicare Other | Admitting: Physician Assistant

## 2022-09-19 ENCOUNTER — Observation Stay (HOSPITAL_COMMUNITY)
Admission: RE | Admit: 2022-09-19 | Discharge: 2022-09-19 | Disposition: A | Discharge status: Home / Self Care | Payer: Medicare Other | Attending: Neurosurgery | Admitting: Neurosurgery

## 2022-09-19 DIAGNOSIS — Z79899 Other long term (current) drug therapy: Secondary | ICD-10-CM | POA: Insufficient documentation

## 2022-09-19 DIAGNOSIS — Z7901 Long term (current) use of anticoagulants: Secondary | ICD-10-CM | POA: Diagnosis not present

## 2022-09-19 DIAGNOSIS — G20A1 Parkinson's disease without dyskinesia, without mention of fluctuations: Secondary | ICD-10-CM | POA: Diagnosis not present

## 2022-09-19 DIAGNOSIS — I2510 Atherosclerotic heart disease of native coronary artery without angina pectoris: Secondary | ICD-10-CM

## 2022-09-19 DIAGNOSIS — Z87891 Personal history of nicotine dependence: Secondary | ICD-10-CM | POA: Insufficient documentation

## 2022-09-19 DIAGNOSIS — M5126 Other intervertebral disc displacement, lumbar region: Secondary | ICD-10-CM | POA: Diagnosis not present

## 2022-09-19 DIAGNOSIS — M5116 Intervertebral disc disorders with radiculopathy, lumbar region: Secondary | ICD-10-CM | POA: Diagnosis not present

## 2022-09-19 DIAGNOSIS — I10 Essential (primary) hypertension: Secondary | ICD-10-CM | POA: Insufficient documentation

## 2022-09-19 DIAGNOSIS — Z981 Arthrodesis status: Secondary | ICD-10-CM | POA: Diagnosis not present

## 2022-09-19 DIAGNOSIS — I4891 Unspecified atrial fibrillation: Secondary | ICD-10-CM | POA: Diagnosis not present

## 2022-09-19 HISTORY — PX: LUMBAR LAMINECTOMY/DECOMPRESSION MICRODISCECTOMY: SHX5026

## 2022-09-19 SURGERY — LUMBAR LAMINECTOMY/DECOMPRESSION MICRODISCECTOMY 1 LEVEL
Anesthesia: General | Site: Back | Laterality: Left

## 2022-09-19 MED ORDER — SUGAMMADEX SODIUM 200 MG/2ML IV SOLN
INTRAVENOUS | Status: DC | PRN
  Administered 2022-09-19: 150 mg via INTRAVENOUS

## 2022-09-19 MED ORDER — OXYCODONE HCL 5 MG/5ML PO SOLN: 5.0000 mg | Freq: Once | ORAL | Status: DC | PRN

## 2022-09-19 MED ORDER — EPHEDRINE SULFATE-NACL 50-0.9 MG/10ML-% IV SOSY
PREFILLED_SYRINGE | INTRAVENOUS | Status: DC | PRN
  Administered 2022-09-19: 5 mg via INTRAVENOUS

## 2022-09-19 MED ORDER — DILTIAZEM HCL ER COATED BEADS 180 MG PO CP24
180.0000 mg | ORAL_CAPSULE | Freq: Every day | ORAL | Status: DC
  Filled 2022-09-19: qty 1

## 2022-09-19 MED ORDER — SODIUM CHLORIDE 0.9 % IV SOLN: 250.0000 mL | INTRAVENOUS | Status: DC

## 2022-09-19 MED ORDER — ACETAMINOPHEN 325 MG PO TABS: 650.0000 mg | ORAL_TABLET | ORAL | Status: DC | PRN

## 2022-09-19 MED ORDER — CHLORHEXIDINE GLUCONATE CLOTH 2 % EX PADS: 6.0000 | MEDICATED_PAD | Freq: Once | CUTANEOUS | Status: DC

## 2022-09-19 MED ORDER — HYDROMORPHONE HCL 2 MG PO TABS: 2.0000 mg | ORAL_TABLET | Freq: Four times a day (QID) | ORAL | Status: DC | PRN

## 2022-09-19 MED ORDER — PHENYLEPHRINE 80 MCG/ML (10ML) SYRINGE FOR IV PUSH (FOR BLOOD PRESSURE SUPPORT)
PREFILLED_SYRINGE | INTRAVENOUS | Status: DC | PRN
  Administered 2022-09-19 (×5): 80 ug via INTRAVENOUS

## 2022-09-19 MED ORDER — ORAL CARE MOUTH RINSE: 15.0000 mL | Freq: Once | OROMUCOSAL | Status: AC

## 2022-09-19 MED ORDER — CEFAZOLIN SODIUM-DEXTROSE 1-4 GM/50ML-% IV SOLN: 1.0000 g | Freq: Three times a day (TID) | INTRAVENOUS | Status: DC

## 2022-09-19 MED ORDER — DEXAMETHASONE SODIUM PHOSPHATE 10 MG/ML IJ SOLN
INTRAMUSCULAR | Status: DC | PRN
  Administered 2022-09-19: 8 mg via INTRAVENOUS

## 2022-09-19 MED ORDER — VITAMIN D 25 MCG (1000 UNIT) PO TABS
1000.0000 [IU] | ORAL_TABLET | Freq: Every day | ORAL | Status: DC
  Administered 2022-09-19: 1000 [IU] via ORAL
  Filled 2022-09-19: qty 1

## 2022-09-19 MED ORDER — KETOROLAC TROMETHAMINE 30 MG/ML IJ SOLN
INTRAMUSCULAR | Status: AC
  Filled 2022-09-19: qty 1

## 2022-09-19 MED ORDER — LIDOCAINE 2% (20 MG/ML) 5 ML SYRINGE
INTRAMUSCULAR | Status: DC | PRN
  Administered 2022-09-19: 100 mg via INTRAVENOUS

## 2022-09-19 MED ORDER — CEFAZOLIN SODIUM-DEXTROSE 2-4 GM/100ML-% IV SOLN
2.0000 g | INTRAVENOUS | Status: AC
  Administered 2022-09-19: 2 g via INTRAVENOUS
  Filled 2022-09-19: qty 100

## 2022-09-19 MED ORDER — PROPOFOL 10 MG/ML IV BOLUS
INTRAVENOUS | Status: DC | PRN
  Administered 2022-09-19: 120 mg via INTRAVENOUS

## 2022-09-19 MED ORDER — TAMSULOSIN HCL 0.4 MG PO CAPS: 0.4000 mg | ORAL_CAPSULE | Freq: Every day | ORAL | Status: DC

## 2022-09-19 MED ORDER — ROSUVASTATIN CALCIUM 5 MG PO TABS: 10.0000 mg | ORAL_TABLET | Freq: Every day | ORAL | Status: DC

## 2022-09-19 MED ORDER — HYDROCODONE-ACETAMINOPHEN 10-325 MG PO TABS
2.0000 | ORAL_TABLET | ORAL | Status: DC | PRN
  Filled 2022-09-19: qty 2

## 2022-09-19 MED ORDER — HYDROMORPHONE HCL 1 MG/ML IJ SOLN
INTRAMUSCULAR | Status: AC
  Filled 2022-09-19: qty 1

## 2022-09-19 MED ORDER — ONDANSETRON HCL 4 MG/2ML IJ SOLN: 4.0000 mg | Freq: Four times a day (QID) | INTRAMUSCULAR | Status: DC | PRN

## 2022-09-19 MED ORDER — CYCLOBENZAPRINE HCL 10 MG PO TABS: 10.0000 mg | ORAL_TABLET | Freq: Three times a day (TID) | ORAL | Status: DC | PRN

## 2022-09-19 MED ORDER — PHENOL 1.4 % MT LIQD: 1.0000 | OROMUCOSAL | Status: DC | PRN

## 2022-09-19 MED ORDER — FENTANYL CITRATE (PF) 100 MCG/2ML IJ SOLN
INTRAMUSCULAR | Status: DC | PRN
  Administered 2022-09-19: 50 ug via INTRAVENOUS
  Administered 2022-09-19 (×2): 25 ug via INTRAVENOUS
  Administered 2022-09-19 (×2): 50 ug via INTRAVENOUS

## 2022-09-19 MED ORDER — AMISULPRIDE (ANTIEMETIC) 5 MG/2ML IV SOLN: 10.0000 mg | Freq: Once | INTRAVENOUS | Status: DC | PRN

## 2022-09-19 MED ORDER — BUPIVACAINE HCL (PF) 0.25 % IJ SOLN
INTRAMUSCULAR | Status: AC
  Filled 2022-09-19: qty 30

## 2022-09-19 MED ORDER — GABAPENTIN 300 MG PO CAPS
300.0000 mg | ORAL_CAPSULE | Freq: Two times a day (BID) | ORAL | Status: DC
  Filled 2022-09-19: qty 1

## 2022-09-19 MED ORDER — LIDOCAINE 2% (20 MG/ML) 5 ML SYRINGE
INTRAMUSCULAR | Status: AC
  Filled 2022-09-19: qty 5

## 2022-09-19 MED ORDER — ROCURONIUM BROMIDE 10 MG/ML (PF) SYRINGE
PREFILLED_SYRINGE | INTRAVENOUS | Status: AC
  Filled 2022-09-19: qty 10

## 2022-09-19 MED ORDER — BUPIVACAINE HCL (PF) 0.25 % IJ SOLN
INTRAMUSCULAR | Status: DC | PRN
  Administered 2022-09-19: 20 mL

## 2022-09-19 MED ORDER — ACETAMINOPHEN 500 MG PO TABS: 1000.0000 mg | ORAL_TABLET | Freq: Once | ORAL | Status: DC

## 2022-09-19 MED ORDER — HYDROMORPHONE HCL 1 MG/ML IJ SOLN: 1.0000 mg | INTRAMUSCULAR | Status: DC | PRN

## 2022-09-19 MED ORDER — ACETAMINOPHEN 500 MG PO TABS
1000.0000 mg | ORAL_TABLET | Freq: Once | ORAL | Status: AC
  Administered 2022-09-19: 1000 mg via ORAL
  Filled 2022-09-19: qty 2

## 2022-09-19 MED ORDER — PROPOFOL 10 MG/ML IV BOLUS
INTRAVENOUS | Status: AC
  Filled 2022-09-19: qty 20

## 2022-09-19 MED ORDER — PRAMIPEXOLE DIHYDROCHLORIDE 0.25 MG PO TABS: 1.0000 mg | ORAL_TABLET | Freq: Every day | ORAL | Status: DC

## 2022-09-19 MED ORDER — PRAMIPEXOLE DIHYDROCHLORIDE 1 MG PO TABS: 1.0000 mg | ORAL_TABLET | ORAL | Status: DC

## 2022-09-19 MED ORDER — KETOROLAC TROMETHAMINE 15 MG/ML IJ SOLN: 15.0000 mg | Freq: Four times a day (QID) | INTRAMUSCULAR | Status: DC

## 2022-09-19 MED ORDER — ONDANSETRON HCL 4 MG/2ML IJ SOLN
INTRAMUSCULAR | Status: DC | PRN
  Administered 2022-09-19: 4 mg via INTRAVENOUS

## 2022-09-19 MED ORDER — OXYCODONE HCL 5 MG PO TABS: 5.0000 mg | ORAL_TABLET | Freq: Once | ORAL | Status: DC | PRN

## 2022-09-19 MED ORDER — KETOROLAC TROMETHAMINE 15 MG/ML IJ SOLN
INTRAMUSCULAR | Status: DC | PRN
  Administered 2022-09-19: 15 mg via INTRAVENOUS

## 2022-09-19 MED ORDER — LACTATED RINGERS IV SOLN: INTRAVENOUS | Status: DC

## 2022-09-19 MED ORDER — HYDROMORPHONE HCL 1 MG/ML IJ SOLN
0.2500 mg | INTRAMUSCULAR | Status: DC | PRN
  Administered 2022-09-19: 0.5 mg via INTRAVENOUS

## 2022-09-19 MED ORDER — ONDANSETRON HCL 4 MG PO TABS: 4.0000 mg | ORAL_TABLET | Freq: Four times a day (QID) | ORAL | Status: DC | PRN

## 2022-09-19 MED ORDER — DEXAMETHASONE SODIUM PHOSPHATE 10 MG/ML IJ SOLN
INTRAMUSCULAR | Status: AC
  Filled 2022-09-19: qty 1

## 2022-09-19 MED ORDER — SODIUM CHLORIDE 0.9% FLUSH: 3.0000 mL | INTRAVENOUS | Status: DC | PRN

## 2022-09-19 MED ORDER — SODIUM CHLORIDE 0.9% FLUSH: 3.0000 mL | Freq: Two times a day (BID) | INTRAVENOUS | Status: DC

## 2022-09-19 MED ORDER — RASAGILINE MESYLATE 1 MG PO TABS: 1.0000 mg | ORAL_TABLET | Freq: Every day | ORAL | Status: DC

## 2022-09-19 MED ORDER — FENTANYL CITRATE (PF) 250 MCG/5ML IJ SOLN
INTRAMUSCULAR | Status: AC
  Filled 2022-09-19: qty 5

## 2022-09-19 MED ORDER — PRAMIPEXOLE DIHYDROCHLORIDE 0.25 MG PO TABS: 1.5000 mg | ORAL_TABLET | Freq: Every day | ORAL | Status: DC

## 2022-09-19 MED ORDER — ONDANSETRON HCL 4 MG/2ML IJ SOLN: 4.0000 mg | Freq: Once | INTRAMUSCULAR | Status: DC | PRN

## 2022-09-19 MED ORDER — 0.9 % SODIUM CHLORIDE (POUR BTL) OPTIME
TOPICAL | Status: DC | PRN
  Administered 2022-09-19: 1000 mL

## 2022-09-19 MED ORDER — MIDAZOLAM HCL 2 MG/2ML IJ SOLN
INTRAMUSCULAR | Status: AC
  Filled 2022-09-19: qty 2

## 2022-09-19 MED ORDER — CHLORHEXIDINE GLUCONATE 0.12 % MT SOLN
15.0000 mL | Freq: Once | OROMUCOSAL | Status: AC
  Administered 2022-09-19: 15 mL via OROMUCOSAL
  Filled 2022-09-19: qty 15

## 2022-09-19 MED ORDER — HYDROCODONE-ACETAMINOPHEN 5-325 MG PO TABS: 1.0000 | ORAL_TABLET | ORAL | Status: DC | PRN

## 2022-09-19 MED ORDER — THROMBIN (RECOMBINANT) 5000 UNITS EX SOLR
CUTANEOUS | Status: DC | PRN
  Administered 2022-09-19: 10 mL via TOPICAL

## 2022-09-19 MED ORDER — THROMBIN 5000 UNITS EX SOLR
CUTANEOUS | Status: AC
  Filled 2022-09-19: qty 10000

## 2022-09-19 MED ORDER — MENTHOL 3 MG MT LOZG: 1.0000 | LOZENGE | OROMUCOSAL | Status: DC | PRN

## 2022-09-19 MED ORDER — CARBIDOPA-LEVODOPA 25-100 MG PO TABS
1.0000 | ORAL_TABLET | ORAL | Status: DC
  Administered 2022-09-19 (×2): 1 via ORAL
  Filled 2022-09-19: qty 1

## 2022-09-19 MED ORDER — ROCURONIUM BROMIDE 100 MG/10ML IV SOLN
INTRAVENOUS | Status: DC | PRN
  Administered 2022-09-19: 80 mg via INTRAVENOUS

## 2022-09-19 MED ORDER — ACETAMINOPHEN 650 MG RE SUPP: 650.0000 mg | RECTAL | Status: DC | PRN

## 2022-09-19 SURGICAL SUPPLY — 49 items
ADH SKN CLS APL DERMABOND .7 (GAUZE/BANDAGES/DRESSINGS) ×1
APL SKNCLS STERI-STRIP NONHPOA (GAUZE/BANDAGES/DRESSINGS) ×1
BAG COUNTER SPONGE SURGICOUNT (BAG) ×2 IMPLANT
BAG DECANTER FOR FLEXI CONT (MISCELLANEOUS) ×2 IMPLANT
BAG SPNG CNTER NS LX DISP (BAG) ×1
BAND INSRT 18 STRL LF DISP RB (MISCELLANEOUS) ×2
BAND RUBBER #18 3X1/16 STRL (MISCELLANEOUS) ×4 IMPLANT
BENZOIN TINCTURE PRP APPL 2/3 (GAUZE/BANDAGES/DRESSINGS) ×2 IMPLANT
BLADE CLIPPER SURG (BLADE) IMPLANT
BUR CUTTER 7.0 ROUND (BURR) ×2 IMPLANT
CANISTER SUCT 3000ML PPV (MISCELLANEOUS) ×2 IMPLANT
DERMABOND ADVANCED .7 DNX12 (GAUZE/BANDAGES/DRESSINGS) ×2 IMPLANT
DRAPE HALF SHEET 40X57 (DRAPES) IMPLANT
DRAPE LAPAROTOMY 100X72X124 (DRAPES) ×2 IMPLANT
DRAPE MICROSCOPE SLANT 54X150 (MISCELLANEOUS) ×2 IMPLANT
DRAPE SURG 17X23 STRL (DRAPES) ×4 IMPLANT
DRSG OPSITE POSTOP 3X4 (GAUZE/BANDAGES/DRESSINGS) IMPLANT
DURAPREP 26ML APPLICATOR (WOUND CARE) ×2 IMPLANT
ELECT REM PT RETURN 9FT ADLT (ELECTROSURGICAL) ×1
ELECTRODE REM PT RTRN 9FT ADLT (ELECTROSURGICAL) ×2 IMPLANT
GAUZE 4X4 16PLY ~~LOC~~+RFID DBL (SPONGE) IMPLANT
GAUZE SPONGE 4X4 12PLY STRL (GAUZE/BANDAGES/DRESSINGS) ×2 IMPLANT
GLOVE BIO SURGEON STRL SZ 6.5 (GLOVE) ×2 IMPLANT
GLOVE BIOGEL PI IND STRL 6.5 (GLOVE) ×2 IMPLANT
GLOVE ECLIPSE 9.0 STRL (GLOVE) ×2 IMPLANT
GLOVE EXAM NITRILE XL STR (GLOVE) IMPLANT
GOWN STRL REUS W/ TWL LRG LVL3 (GOWN DISPOSABLE) IMPLANT
GOWN STRL REUS W/ TWL XL LVL3 (GOWN DISPOSABLE) ×2 IMPLANT
GOWN STRL REUS W/TWL 2XL LVL3 (GOWN DISPOSABLE) IMPLANT
GOWN STRL REUS W/TWL LRG LVL3 (GOWN DISPOSABLE)
GOWN STRL REUS W/TWL XL LVL3 (GOWN DISPOSABLE) ×1
KIT BASIN OR (CUSTOM PROCEDURE TRAY) ×2 IMPLANT
KIT TURNOVER KIT B (KITS) ×2 IMPLANT
NDL SPNL 22GX3.5 QUINCKE BK (NEEDLE) IMPLANT
NEEDLE HYPO 22GX1.5 SAFETY (NEEDLE) ×2 IMPLANT
NEEDLE SPNL 22GX3.5 QUINCKE BK (NEEDLE) IMPLANT
NS IRRIG 1000ML POUR BTL (IV SOLUTION) ×2 IMPLANT
PACK LAMINECTOMY NEURO (CUSTOM PROCEDURE TRAY) ×2 IMPLANT
PAD ARMBOARD 7.5X6 YLW CONV (MISCELLANEOUS) ×6 IMPLANT
SOL ELECTROSURG ANTI STICK (MISCELLANEOUS) ×1
SOLUTION ELECTROSURG ANTI STCK (MISCELLANEOUS) ×2 IMPLANT
SPIKE FLUID TRANSFER (MISCELLANEOUS) ×2 IMPLANT
SPONGE SURGIFOAM ABS GEL SZ50 (HEMOSTASIS) ×2 IMPLANT
STRIP CLOSURE SKIN 1/2X4 (GAUZE/BANDAGES/DRESSINGS) ×2 IMPLANT
SUT VIC AB 2-0 CT1 18 (SUTURE) ×2 IMPLANT
SUT VIC AB 3-0 SH 8-18 (SUTURE) ×2 IMPLANT
TOWEL GREEN STERILE (TOWEL DISPOSABLE) ×2 IMPLANT
TOWEL GREEN STERILE FF (TOWEL DISPOSABLE) ×2 IMPLANT
WATER STERILE IRR 1000ML POUR (IV SOLUTION) ×2 IMPLANT

## 2022-09-19 NOTE — Discharge Summary (Signed)
Physician Discharge Summary  Patient ID: AXYL SITZMAN MRN: [id number] DOB/AGE: 27-Aug-1950 72 y.o.  Admit date: 09/19/2022 Discharge date: 09/19/2022  Admission Diagnoses:  Discharge Diagnoses:  Principal Problem:   Recurrent herniation of lumbar disc   Discharged Condition: good  Hospital Course: Patient admitted to the hospital where he underwent uncomplicated redo laminotomy and microdiscectomy on the left at L4-5.  Postoperatively doing very well.  Preoperative severe radicular pain resolved.  Standing ambulating and voiding without difficulty.  Patient desires discharge home.  Consults:   Significant Diagnostic Studies:   Treatments:   Discharge Exam: Blood pressure (!) 140/87, pulse 66, temperature 98 F (36.7 C), resp. rate 18, height 5\' 10"  (1.778 m), weight 75.6 kg, SpO2 100 %. Awake and alert.  Oriented and appropriate.  Motor and sensory function stable.  Wound clean and dry.  Chest and abdomen benign.  Disposition: Discharge disposition: 01-Home or Self Care        Allergies as of 09/19/2022   No Known Allergies      Medication List     TAKE these medications    carbidopa-levodopa 25-100 MG tablet Commonly known as: SINEMET IR Take one pill every 2.5 hours for a total of 6 to 7 times per day.   CO Q10 PO Take 1 capsule by mouth daily.   diltiazem 180 MG 24 hr capsule Commonly known as: CARDIZEM CD TAKE 1 CAPSULE BY MOUTH DAILY   Eliquis 5 MG Tabs tablet Generic drug: apixaban TAKE 1 TABLET BY MOUTH TWICE DAILY   gabapentin 300 MG capsule Commonly known as: NEURONTIN Take 300 mg by mouth 2 (two) times daily.   HYDROmorphone 2 MG tablet Commonly known as: Dilaudid Take 1 tablet (2 mg total) by mouth every 6 (six) hours as needed for up to 12 doses for severe pain.   methylPREDNISolone 4 MG Tbpk tablet Commonly known as: MEDROL DOSEPAK Use as directed   pramipexole 1.5 MG tablet Commonly known as: MIRAPEX Take 1 tablet (1.5 mg total)  by mouth 3 (three) times daily. What changed:  how much to take when to take this additional instructions   rasagiline 1 MG Tabs tablet Commonly known as: AZILECT Take 1 tablet (1 mg total) by mouth daily.   rosuvastatin 10 MG tablet Commonly known as: CRESTOR Take 1 tablet (10 mg total) by mouth daily. If 10 mg is not tolerable due to muscle pain and stiffness, try taking 5 mg every day or every other day.   tamsulosin 0.4 MG Caps capsule Commonly known as: FLOMAX Take 0.4 mg by mouth at bedtime.   VITAMIN D3 PO Take 1 tablet by mouth daily.         SignedKathaleen Maser Sinan Tuch 09/19/2022, 1:29 PM

## 2022-09-19 NOTE — Brief Op Note (Signed)
09/19/2022  9:22 AM  PATIENT:  Jack Cobb  72 y.o. male  PRE-OPERATIVE DIAGNOSIS:  Herniated nucleus pulposus  POST-OPERATIVE DIAGNOSIS:  Herniated nucleus pulposus  PROCEDURE:  Procedure(s) with comments: Microdiscectomy - left - Lumbar Four-Lumbar Five (Left) - 3C  SURGEON:  Surgeon(s) and Role:    * Julio Sicks, MD - Primary    * Lisbeth Renshaw, MD - Assisting  PHYSICIAN ASSISTANT:   ASSISTANTS:    ANESTHESIA:   general  EBL:  25 mL   BLOOD ADMINISTERED:none  DRAINS: none   LOCAL MEDICATIONS USED:  MARCAINE     SPECIMEN:  No Specimen  DISPOSITION OF SPECIMEN:  PATHOLOGY  COUNTS:  YES  TOURNIQUET:  * No tourniquets in log *  DICTATION: .Dragon Dictation  PLAN OF CARE: Admit for overnight observation  PATIENT DISPOSITION:  PACU - hemodynamically stable.   Delay start of Pharmacological VTE agent (>24hrs) due to surgical blood loss or risk of bleeding: yes

## 2022-09-19 NOTE — Discharge Instructions (Signed)

## 2022-09-19 NOTE — H&P (Signed)
Jack Cobb is an 72 y.o. male.   Chief Complaint: Left leg pain. HPI: 72 year old male with severe left lower extremity radicular pain failing conservative manage.  Workup demonstrates evidence for recurrent left-sided L4-5 disc herniation with caudal migration and marked compression of left L5 nerve root.  Patient presents now for redo laminotomy and microdiscectomy on the left at L4-5 in hopes improving his symptoms.  Past Medical History:  Diagnosis Date   CAD (coronary artery disease), native coronary artery 09/06/2020   Coronary CTA was done showing a coronary calcium score of 201 placing him at the 60th percentile for age and gender.  He was noted to have 75 to 99% stenosis of the mid LAD with scattered nonobstructive plaquing elsewhere.  FFR 0.7 in D1 and 0.73 at the apex>>medical management   HLD (hyperlipidemia) 09/06/2020   Hypertension    Parkinson disease     Past Surgical History:  Procedure Laterality Date   BACK SURGERY     2019.2023, january 2024 lumbar surgeries with dr. Masyn Rostro   HERNIA REPAIR     ventral hernia repair with mesh x2    Family History  Problem Relation Age of Onset   Unexplained death Mother 28       was on coumadin, reason not known   Heart attack Father 75   Heart disease Brother        had 2 stents in his 44s   Parkinson's disease Neg Hx    Social History:  reports that he has never smoked. He has quit using smokeless tobacco.  His smokeless tobacco use included chew. He reports that he does not drink alcohol and does not use drugs.  Allergies: No Known Allergies  Medications Prior to Admission  Medication Sig Dispense Refill   apixaban (ELIQUIS) 5 MG TABS tablet TAKE 1 TABLET BY MOUTH TWICE DAILY 60 tablet 5   carbidopa-levodopa (SINEMET IR) 25-100 MG tablet Take one pill every 2.5 hours for a total of 6 to 7 times per day. 630 tablet 3   Cholecalciferol (VITAMIN D3 PO) Take 1 tablet by mouth daily.     Coenzyme Q10 (CO Q10 PO) Take 1  capsule by mouth daily.     diltiazem (CARDIZEM CD) 180 MG 24 hr capsule TAKE 1 CAPSULE BY MOUTH DAILY 90 capsule 3   gabapentin (NEURONTIN) 300 MG capsule Take 300 mg by mouth 2 (two) times daily.     HYDROmorphone (DILAUDID) 2 MG tablet Take 1 tablet (2 mg total) by mouth every 6 (six) hours as needed for up to 12 doses for severe pain. 12 tablet 0   pramipexole (MIRAPEX) 1.5 MG tablet Take 1 tablet (1.5 mg total) by mouth 3 (three) times daily. (Patient taking differently: Take 1-1.5 mg by mouth See admin instructions. Take 1.5 mg by mouth in morning and afternoon and take 1 mg at bedtime) 90 tablet 5   rasagiline (AZILECT) 1 MG TABS tablet Take 1 tablet (1 mg total) by mouth daily. 90 tablet 3   tamsulosin (FLOMAX) 0.4 MG CAPS capsule Take 0.4 mg by mouth at bedtime.     methylPREDNISolone (MEDROL DOSEPAK) 4 MG TBPK tablet Use as directed (Patient not taking: Reported on 09/12/2022) 21 tablet 0   rosuvastatin (CRESTOR) 10 MG tablet Take 1 tablet (10 mg total) by mouth daily. If 10 mg is not tolerable due to muscle pain and stiffness, try taking 5 mg every day or every other day. (Patient not taking: Reported on 09/12/2022) 90 tablet  3    No results found for this or any previous visit (from the past 48 hour(s)). No results found.  Pertinent items noted in HPI and remainder of comprehensive ROS otherwise negative.  Blood pressure 124/72, pulse 68, temperature 98.3 F (36.8 C), temperature source Oral, resp. rate 17, height 5\' 10"  (1.778 m), weight 75.6 kg, SpO2 94 %.  Patient is awake and alert.  He is oriented and appropriate.  Speech is fluent.  Judgment insight are intact.  Cranial nerve function normal bilateral.  Motor examination reveals intact motor strength bilaterally except he has some mild weakness dorsiflexion on the left side.  Sensory examination is decreased sensation.  Light touch in his left L5 and S1 dermatomes.  Deep tendon rhexis hypoactive throughout.  Achilles reflexes are  absent.  Gait is antalgic.  Posture is mildly flexed.  Straight raising is strongly positive on the left equivocal on the right.  Examination head ears eyes nose and throat is unremarked.  Chest and abdomen benign.  Extremities are free from injury or deformity. Assessment/Plan Left L4-5 recurrent herniated nucleus pulposus with radiculopathy.  Plan left L4-5 redo laminotomy and microdiscectomy.  Risks and benefits been explained.  Patient wishes to proceed.  Kathaleen Maser Ayomikun Starling 09/19/2022, 7:52 AM

## 2022-09-19 NOTE — Anesthesia Procedure Notes (Signed)
Procedure Name: Intubation Date/Time: 09/19/2022 8:12 AM  Performed by: Marny Lowenstein, CRNAPre-anesthesia Checklist: Patient identified, Emergency Drugs available, Suction available and Patient being monitored Patient Re-evaluated:Patient Re-evaluated prior to induction Oxygen Delivery Method: Circle system utilized Preoxygenation: Pre-oxygenation with 100% oxygen Induction Type: IV induction Ventilation: Mask ventilation without difficulty Laryngoscope Size: Miller and 2 Grade View: Grade I Tube type: Oral Tube size: 7.5 mm Number of attempts: 1 Airway Equipment and Method: Patient positioned with wedge pillow and Stylet Placement Confirmation: ETT inserted through vocal cords under direct vision, positive ETCO2 and breath sounds checked- equal and bilateral Secured at: 24 cm Tube secured with: Tape Dental Injury: Teeth and Oropharynx as per pre-operative assessment

## 2022-09-19 NOTE — Care Management (Signed)
Patient with order to DC to home today. Unit staff to provide DME needed for home.   No HH needs identified Patient will have family/ friends provide transportation home. No other TOC needs identified for DC 

## 2022-09-19 NOTE — Anesthesia Postprocedure Evaluation (Signed)
Anesthesia Post Note  Patient: Jack Cobb  Procedure(s) Performed: Microdiscectomy - left - Lumbar Four-Lumbar Five (Left: Back)     Patient location during evaluation: PACU Anesthesia Type: General Level of consciousness: awake and alert, oriented and patient cooperative Pain management: pain level controlled Vital Signs Assessment: post-procedure vital signs reviewed and stable Respiratory status: spontaneous breathing, nonlabored ventilation and respiratory function stable Cardiovascular status: blood pressure returned to baseline and stable Postop Assessment: no apparent nausea or vomiting Anesthetic complications: no   No notable events documented.  Last Vitals:  Vitals:   09/19/22 1039 09/19/22 1101  BP: 129/74 (!) 140/87  Pulse: (!) 59 66  Resp: 12 18  Temp: 36.7 C   SpO2: 95% 100%    Last Pain:  Vitals:   09/19/22 1105  TempSrc:   PainSc: 2    Pain Goal: Patients Stated Pain Goal: 3 (09/19/22 1105)                 Lannie Fields

## 2022-09-19 NOTE — Op Note (Signed)
Date of procedure: 09/19/2022  Date of dictation: Same  Service: Neurosurgery  Preoperative diagnosis: Left L4-5 recurrent herniated nucleus pulposus with radiculopathy  Postoperative diagnosis: Same  Procedure Name: Left L4-5 reexploration of laminotomy with redo microdiscectomy  Surgeon:Adra Shepler A.Deyana Wnuk, M.D.  Asst. Surgeon: Conchita Paris  Anesthesia: General  Indication: 72 year old male status post prior L4-5 and L5-S1 decompressive surgery presents now with severe left lower extremity radicular pain numbness and weakness.  Workup demonstrates evidence of a large caudally migrated disc fragment at L4-5 with severe compression of the left L5 nerve root.  Patient presents now for redo laminotomy and microdiscectomy in hopes of improving his symptoms.  Operative note: After induction of anesthesia, patient position prone onto a Wilson frame and properly padded.  Lumbar region prepped and draped sterilely.  Incision made overlying L4-5.  Dissection performed on the left.  Retractor placed.  X-ray taken.  Level confirmed.  Previous laminotomy was dissected free and widened slightly using high-speed drill and Kerrison rongeurs.  Epidural scar was elevated and resected.  Microscope brought in the field used microsuction the spinal canal.  The thecal sac and left L5 nerve root were gently mobilized and retracted towards the midline.  An inferiorly migrated disc herniation was dissected free and removed and several large fragments.  The disc space itself was examined and found not to be grossly torn or herniated.  The wound was irrigated.  There was no evidence of injury to the thecal sac or nerve roots.  Gelfoam was placed topically over the laminotomy defect.  Wounds and closed in layers with Vicryl sutures.  Steri-Strips and sterile dressing were applied.  No apparent complications.  Patient tolerated the procedure well and he returns to recovery room postop.

## 2022-09-19 NOTE — Evaluation (Signed)
Occupational Therapy Evaluation Patient Details Name: Jack Cobb MRN: [id number] DOB: 23-Jul-1950 Today's Date: 09/19/2022   History of Present Illness 72 yo M s/p Left L4-5 reexploration of laminotomy with redo microdiscectomy.  PMH includes back surgeries, Parkinson's   Clinical Impression   Patient admitted for the procedure above.  PTA he lives at home, and remains very active.  Over the last two weeks he has been more limited by pain.  His spouse is able to assist as needed.  Patient has a background diagnosis af Parkinson's, and generally needs to use the RW in the evenings.  Patient able to perform ADL from a sit to stand level, waked the halls with RW, and did climb 8 stairs without assist.  All precautions reviewed, and patient with a good understanding.  No further OT needs in the acute setting.        Recommendations for follow up therapy are one component of a multi-disciplinary discharge planning process, led by the attending physician.  Recommendations may be updated based on patient status, additional functional criteria and insurance authorization.   Assistance Recommended at Discharge Set up Supervision/Assistance  Patient can return home with the following Assist for transportation;Assistance with cooking/housework    Functional Status Assessment  Patient has had a recent decline in their functional status and demonstrates the ability to make significant improvements in function in a reasonable and predictable amount of time.  Equipment Recommendations  None recommended by OT    Recommendations for Other Services       Precautions / Restrictions Precautions Precautions: Fall Restrictions Weight Bearing Restrictions: No      Mobility Bed Mobility Overal bed mobility: Modified Independent                  Transfers Overall transfer level: Modified independent Equipment used: Rolling walker (2 wheels)                      Balance Overall  balance assessment: Needs assistance Sitting-balance support: Feet supported Sitting balance-Leahy Scale: Good     Standing balance support: Reliant on assistive device for balance Standing balance-Leahy Scale: Fair                             ADL either performed or assessed with clinical judgement   ADL Overall ADL's : At baseline                                             Vision Patient Visual Report: No change from baseline       Perception     Praxis      Pertinent Vitals/Pain Pain Assessment Pain Assessment: Faces Faces Pain Scale: Hurts a little bit Pain Location: Incisional Pain Descriptors / Indicators: Tender Pain Intervention(s): Monitored during session     Hand Dominance Right   Extremity/Trunk Assessment Upper Extremity Assessment Upper Extremity Assessment: Overall WFL for tasks assessed   Lower Extremity Assessment Lower Extremity Assessment: Overall WFL for tasks assessed   Cervical / Trunk Assessment Cervical / Trunk Assessment: Kyphotic   Communication Communication Communication: No difficulties   Cognition Arousal/Alertness: Awake/alert Behavior During Therapy: WFL for tasks assessed/performed Overall Cognitive Status: Within Functional Limits for tasks assessed  General Comments   VSS on RA    Exercises     Shoulder Instructions      Home Living Family/patient expects to be discharged to:: Private residence Living Arrangements: Spouse/significant other Available Help at Discharge: Family;Available 24 hours/day Type of Home: House Home Access: Stairs to enter [employer] of Steps: 13 Entrance Stairs-Rails: Left Home Layout: Two level;Able to live on main level with bedroom/bathroom     Bathroom Shower/Tub: Tub/shower unit;Walk-in shower   Bathroom Toilet: Standard Bathroom Accessibility: Yes How Accessible: Accessible via  walker Home Equipment: Rolling Walker (2 wheels);Shower seat          Prior Functioning/Environment Prior Level of Function : Independent/Modified Independent                        OT Problem List: Pain      OT Treatment/Interventions:      OT Goals(Current goals can be found in the care plan section) Acute Rehab OT Goals Patient Stated Goal: Return home today OT Goal Formulation: With patient Time For Goal Achievement: 09/22/22 Potential to Achieve Goals: Good  OT Frequency:      Co-evaluation              AM-PAC OT "6 Clicks" Daily Activity     Outcome Measure Help from another person eating meals?: None Help from another person taking care of personal grooming?: None Help from another person toileting, which includes using toliet, bedpan, or urinal?: None Help from another person bathing (including washing, rinsing, drying)?: None Help from another person to put on and taking off regular upper body clothing?: None Help from another person to put on and taking off regular lower body clothing?: None 6 Click Score: 24   End of Session Equipment Utilized During Treatment: Gait belt;Rolling walker (2 wheels) Nurse Communication: Mobility status  Activity Tolerance: Patient tolerated treatment well Patient left: in bed;with call bell/phone within reach;with family/visitor present  OT Visit Diagnosis: Unsteadiness on feet (R26.81)                Time: 1419-1440 OT Time Calculation (min): 21 min Charges:  OT General Charges $OT Visit: 1 Visit OT Evaluation $OT Eval Moderate Complexity: 1 Mod  09/19/2022  RP, OTR/L  Acute Rehabilitation Services  Office:  [phone]   Suzanna Obey 09/19/2022, 3:03 PM

## 2022-09-19 NOTE — Transfer of Care (Signed)
Immediate Anesthesia Transfer of Care Note  Patient: Jack Cobb  Procedure(s) Performed: Microdiscectomy - left - Lumbar Four-Lumbar Five (Left: Back)  Patient Location: PACU  Anesthesia Type:General  Level of Consciousness: drowsy  Airway & Oxygen Therapy: Patient Spontanous Breathing and Patient connected to face mask oxygen  Post-op Assessment: Report given to RN and Post -op Vital signs reviewed and stable  Post vital signs: Reviewed and stable  Last Vitals:  Vitals Value Taken Time  BP 96/78 09/19/22 0930  Temp    Pulse 59 09/19/22 0930  Resp 15 09/19/22 0930  SpO2 98 % 09/19/22 0930  Vitals shown include unvalidated device data.  Last Pain:  Vitals:   09/19/22 0645  TempSrc:   PainSc: 10-Worst pain ever      Patients Stated Pain Goal: 3 (09/19/22 0645)  Complications: No notable events documented.

## 2022-09-19 NOTE — Plan of Care (Signed)
Pt doing well. Pt and wife given D/C instructions with verbal understanding. Incision is clean and dry with no sign of infection. Pt's IV was removed prior to D/C. Pt D/C'd home via wheelchair per MD order. Pt is stable @ D/C and has no other needs at this time. Rema Fendt, RN

## 2022-09-20 ENCOUNTER — Encounter (HOSPITAL_COMMUNITY): Payer: Self-pay | Admitting: Neurosurgery

## 2022-10-25 ENCOUNTER — Other Ambulatory Visit: Payer: Self-pay | Admitting: Neurology

## 2022-10-25 DIAGNOSIS — G20A1 Parkinson's disease without dyskinesia, without mention of fluctuations: Secondary | ICD-10-CM

## 2022-10-26 ENCOUNTER — Encounter: Payer: Self-pay | Admitting: Neurology

## 2022-10-26 ENCOUNTER — Ambulatory Visit: Payer: Medicare Other | Admitting: Neurology

## 2022-12-04 ENCOUNTER — Telehealth: Payer: Self-pay | Admitting: Adult Health

## 2022-12-04 DIAGNOSIS — G20B2 Parkinson's disease with dyskinesia, with fluctuations: Secondary | ICD-10-CM

## 2022-12-04 MED ORDER — PRAMIPEXOLE DIHYDROCHLORIDE 1.5 MG PO TABS
1.5000 mg | ORAL_TABLET | Freq: Three times a day (TID) | ORAL | 5 refills | Status: DC
Start: 2022-12-04 — End: 2022-12-05

## 2022-12-04 NOTE — Telephone Encounter (Signed)
Refill sent for the pt,

## 2022-12-04 NOTE — Telephone Encounter (Signed)
Pt requesting a refill on pramipexole (MIRAPEX)  1.0. should be sent to [facility]

## 2022-12-05 ENCOUNTER — Other Ambulatory Visit: Payer: Self-pay | Admitting: Neurology

## 2022-12-05 MED ORDER — PRAMIPEXOLE DIHYDROCHLORIDE 1 MG PO TABS
1.0000 mg | ORAL_TABLET | Freq: Three times a day (TID) | ORAL | 5 refills | Status: DC
Start: 2022-12-05 — End: 2023-04-17

## 2022-12-05 NOTE — Telephone Encounter (Signed)
Pt's wife, Jessen Siegman said, the prescription was suppose to be Pramipexole1.0 mg sent to the pharmacy. We adjusted his medication ourselves because was making him dizzy.

## 2022-12-05 NOTE — Addendum Note (Signed)
Addended by: Judi Cong on: 12/05/2022 03:18 PM   Modules accepted: Orders

## 2022-12-05 NOTE — Telephone Encounter (Signed)
Script corrected and resent to the pharmacy

## 2022-12-11 ENCOUNTER — Encounter: Payer: Self-pay | Admitting: Neurology

## 2022-12-11 ENCOUNTER — Ambulatory Visit: Payer: Medicare Other | Admitting: Neurology

## 2022-12-11 VITALS — BP 107/64 | HR 66 | Ht 70.0 in | Wt 158.0 lb

## 2022-12-11 DIAGNOSIS — G20B2 Parkinson's disease with dyskinesia, with fluctuations: Secondary | ICD-10-CM | POA: Diagnosis not present

## 2022-12-11 NOTE — Patient Instructions (Signed)
It was nice to see you both again today.  Here is what we discussed today:   You can get your vitamin D level checked at your PCP office with the next scheduled blood work.  You can continue with levodopa immediate release 25-100 mg strength 1 pill every 2-1/2 hours for a total of 6-7 doses. We will reduce her pramipexole back to 1 mg 3 times daily due to increasing in dyskinesias. For better help with mobility, we can add an extended release levodopa 25-100 mg strength 1 pill up to twice daily as needed.  You can take it together with the immediate release dose when you take it as needed, you can decide what time you want to take it. We can consider a medication called amantadine for dyskinesias to help calm them down. Consider using a walking stick or hiking pole when you are out and about.  Continue to use your walker at home as needed.

## 2022-12-11 NOTE — Progress Notes (Signed)
Subjective:    Patient ID: Jack Cobb is a 72 y.o. male.  HPI    Interim history:   Mr. Jack Cobb is a 72 year old right-handed gentleman, with an underlying medical history of hypertension, HLP, PAF, CAD, s/p hernia repair in 2012, and s/p multiple back surgeries, who presents for follow-up consultation of his right-sided predominant Parkinson's disease which was diagnosed in May 2007.  He is accompanied by his wife again today.  He missed an appointment on 10/26/22. I last saw him on 07/21/2021, at which time he reported feeling fairly stable.  He was having dyskinesias.  He reported intermittent constipation.  He had undergone recent back surgery.  He was advised to take Sinemet every 2-1/2 hours for total of 6-7 doses, he was no longer taking Sinemet CR.   He saw [name] on 07/27/2022, at which time he reported floaters in his eyes.  He was encouraged to see his ophthalmologist.  He was advised to increase pramipexole to 1.5 mg 3 times daily and continue with Azilect once daily and Sinemet 1 pill every 2 and half hours.    Today, 12/11/2022: He reports not doing all that great.  He does not have much in the way of pain, is reducing his gabapentin, is currently on 300 mg at bedtime and takes Tylenol as needed.  His dyskinesias have generally become worse.  Also, he feels that that levodopa does not last 2-1/2 hours each dose.  He does not have a pattern as to when his mobility becomes worse or the levodopa does not last as long.  His dyskinesias seem to be worse in the afternoons.  He continues to stay active.  He is outside a lot.  His wife had him start an over-the-counter vitamin D supplement, she is unsure of the exact dose.  Of note, he had back surgery with microdiscectomy at L4-5 under Dr. Jordan Likes on 09/19/2022. Since he started vitamin D, his constipation is better.  His weight fluctuates a little bit.  The patient's allergies, current medications, family history, past medical history,  past social history, past surgical history and problem list were reviewed and updated as appropriate.    Previously (copied from previous notes for reference):    He saw [name], NP on 01/26/2022, at which time he reported feeling stable.  He continued to take Sinemet every 2-1/2 hours.  He had tried taking it closer together but did not feel well with regimen.  I saw him on 11/02/2020, at which time he felt less stable on his medication for Parkinson's disease.  He was followed by cardiology for his new onset atrial fibrillation.  He was diagnosed in January 2022.  He was advised to add Sinemet CR up to twice daily and continue with his Sinemet IR.  He was also advised to continue with Azilect and Mirapex at the current doses.  He had started diltiazem and Eliquis through cardiology.  He was advised to try melatonin at night for sleep.   His wife called in the interim in October 2022 reporting that patient had COVID symptoms and tested positive for COVID-19.  He was advised to proceed to urgent care or emergency room for acute management of his COVID.         I saw him on 05/04/2020, at which time he reported not trying the Sinemet CR as we had talked about before.  He was encouraged to try Sinemet CR at bedtime and keep his regular levodopa the same.  He  was advised to follow-up routinely in 6 months, he was considering the COVID-vaccine.   I saw him on 10/07/2019, at which time he felt more or less stable, had filled the Sinemet CR to be taken at bedtime but did not actually take it.  I did encourage him to started and we mutually agreed to keep his Sinemet, Mirapex and Azilect the same.     I saw him on 04/09/2019, at which time he was taking Sinemet 6 times a day about 2-1/2 hourly, reported more difficulty getting started in the morning, more slowness.  He was advised to add Sinemet CR at bedtime.    I saw him in a virtual visit on 10/02/2018, at which time he reported feeling fairly  stable.  He had some intermittent issues with constipation and his balance and mobility.  He was advised to continue with Sinemet 1 pill 6 times a day and Mirapex 1 pill 3 times a day as well as Azilect 1 pill once daily.    I saw him on 04/03/2018, at which time he reported doing well, he had had lumbar spine surgery under Dr. Dutch Quint in July 2019 and had done well after it.  He was not on any pain medication.  He was trying to stay active.  Sinemet was 1 pill 5 or 6 times a day, more consistently 5 times a day.  He was taking Mirapex twice daily 1 mg strength and sometimes an additional half pill during the middle of the day.  He had tried hemp for back pain which he found helpful, he was wondering about the usefulness of CBD oil.  I suggested we keep his medication regimen the same, including Azilect 1 mg once daily in the morning, Mirapex and Sinemet.  We talked about potentially doing speech therapy evaluation.       I saw him on 10/01/2017, at which time he reported doing okay from a Parkinson's standpoint, however, he started having more problems with shooting pains to the right leg and had an L spine MRI in December 2018. He started seeing a spine specialist. He had received injections which provided temporary relief.    I saw him on 04/02/2017, at which time he had issues with constipation, no cognitive issues, no recent falls, he did report that he may have pulled a muscle in the right hip area. We mutually agreed to continue his current medication regimen of Azilect once daily, Mirapex ER once daily and Sinemet 1 pill 5 times a day.   He called in the interim reporting that the Mirapex ER was to expensive and we switched him over to Mirapex IR 1 mg 3 times a day.   I saw him on 09/27/2016, at which time he reported doing okay but felt like his levodopa was wearing off after 3 hours. He was taking half a pill in the morning, 1 pill midday, then another full pill about 3-4 hours later and half a  pill in the evening. Is not exercising on a regular basis but overall was fairly active. He had some constipation intermittently. Memory was stable, some forgetfulness reported, some mood irritability was reported by his girlfriend. He was getting his Mirapex ER brand-name from [place]. He was taking Azilect generic once daily. I suggested we continue with Mirapex ER 3 mg daily and Azilect 1 mg daily. I suggested we increase his Sinemet to 1 pill 5 times a day. I asked him to exercise more regularly and be proactive about constipation  issues.   Of note, the patient no showed for appointment on 07/12/2016 secondary to illness. I saw him on 01/10/2016, at which time he reported worsening of his posture in more stiffness. He had no significant problems with balance or constipation or mood. He was taking overall a low dose of Sinemet. I suggested he could continue with the current regimen but increase his second dose also to a whole pill. He was taking half a pill for 3 doses and 1 whole pill at 3 PM. I suggested he could try to increase his Sinemet. Overall, he felt fairly stable but I did notice some changes on exam as well.   I saw him on 07/12/2015, at which time he reported feeling stable. He did not notice much in the way of difference after the increase in the Mirapex long-acting. Overall, he was less stressed. He was able to transition his mother took class nursing home and she did quite well given her age of 41. He was sleeping a little better not having to worry about his mother on the time. He was taking Sinemet half a pill 4 times a day sometimes a whole pill at 3 PM, sometimes would get only 3 doses and for the day. He is trying to stay active and trying to do better with his water intake as opposed to coffee and soda. I suggested he could consider alternating 1 pill with half a pill for a total of 3 pills daily, but he preferred to stay on the regimen of about 2 and half pills daily.   I saw him on  01/06/2015, at which time he reported doing fairly well overall. He was active. He had some increased stiffness particularly in the mornings and later in the evenings. He would sometimes forget his evening dose of Sinemet. He still had some reservations about taking Sinemet because of fear of potential long-term effects and side effects. We mutually agreed to keep his Sinemet at 3 half pills a day for 3 doses, and one whole pill around 3 PM. He was advised to continue with Azilect once daily as well. I suggested he increase his Mirapex ER from 2.25 mg to 3 mg once daily. He was on brand-name Mirapex ER.   I saw him on 07/08/2014, at which time he reported more tremors, especially towards the end of the day. He reported more fatigue, and stiffness and slowness after 3 PM. He was taking Azilect once daily and brand-name Mirapex long-acting. He was taking Sinemet half a pill 3 times a day. He continued to be very active and take care of his elderly mother, 68 years old, alternating her care with his brother. I suggested he continue with Azilect once daily and Mirapex ER but he increase Sinemet to half a pill at 7 AM, half a pill at 11 AM, 1 whole pill at 3 PM and half a pill at 7 PM.   I saw him on 01/05/2014, at which time he reported feeling worse with his posture and his walking was slower. He felt stable with his mood and memory. He had mild constipation. He continued to work here and there. He reported no issues with driving. His mother continue to stay with him every other week and he was sharing care of her with his brother. His mother has dementia and is in her mid 15s. I suggested he continue with Azilect and Mirapex ER at the same doses but and a half pill of levodopa to his regimen.  I saw him on 06/16/2013, at which time he reported feeling stable, with the exception of occasional start hesitation and trouble turning. I encouraged him to increase his exercise to more daily walking exercises and kept  his medications the same.   I first met him on 12/12/2012, at which time I felt that his exam was stable. I did not make any medication changes at the time but suggested he consider taking coenzyme Q 10.     He previously followed by Dr. Avie Echevaria and was last seen by him on 06/14/2012, and which time Dr. Sandria Manly felt that he was doing very well. He did not make any changes to his medication regimen.   He was diagnosed with idiopathic right-sided predominant Parkinson's disease in May 2007 and initially treated with long-acting Mirapex, rasagiline and levodopa. He exercises regularly. He has had some daytime somnolence. He snores at night. In February 2013 he went on disability. He denied compulsive behavior, orthostatic hypotension or edema. He has had some difficulty with his memory. His mother has dementia at age 28. He lives alone, and takes care of his mother, who stays with him one week and with his only brother one week. He stays active and tends to sleep better when his mother stays with his brother as he can rest more and relax more. He has had no depression, anxiety, hallucinations, delusions or major memory problems, or dyskinesias.  He had tried the Sinemet CR for about 7 to 10 days but felt jittery afterwards, has not taken it since January 2022. He continues to take Azilect once daily, Mirapex 3 times a day and Sinemet IR 1 pill 6 times a day, stopping around 6 AM every 2-1/2 hours.  Bedtime is generally between 9 and 11 PM.  His Past Medical History Is Significant For: Past Medical History:  Diagnosis Date   CAD (coronary artery disease), native coronary artery 09/06/2020   Coronary CTA was done showing a coronary calcium score of 201 placing him at the 60th percentile for age and gender.  He was noted to have 75 to 99% stenosis of the mid LAD with scattered nonobstructive plaquing elsewhere.  FFR 0.7 in D1 and 0.73 at the apex>>medical management   HLD (hyperlipidemia) 09/06/2020    Hypertension    Parkinson disease     His Past Surgical History Is Significant For: Past Surgical History:  Procedure Laterality Date   BACK SURGERY     2019.2023, january 2024 lumbar surgeries with dr. pool   HERNIA REPAIR     ventral hernia repair with mesh x2   LUMBAR LAMINECTOMY/DECOMPRESSION MICRODISCECTOMY Left 09/19/2022   Procedure: Microdiscectomy - left - Lumbar Four-Lumbar Five;  Surgeon: Julio Sicks, MD;  Location: [facility] OR;  Service: Neurosurgery;  Laterality: Left;  3C    His Family History Is Significant For: Family History  Problem Relation Age of Onset   Unexplained death Mother 67       was on coumadin, reason not known   Heart attack Father 26   Heart disease Brother        had 2 stents in his 85s   Parkinson's disease Neg Hx     His Social History Is Significant For: Social History   Socioeconomic History   Marital status: Married    Spouse name: Not on file   Number of children: 3   Years of education: 12th   Highest education level: Not on file  Occupational History   Occupation: Retired from tile  work    Comment: retired  Tobacco Use   Smoking status: Never   Smokeless tobacco: Former    Types: Chew   Tobacco comments:    quit 20 years ago  Vaping Use   Vaping status: Never Used  Substance and Sexual Activity   Alcohol use: No   Drug use: No   Sexual activity: Not on file  Other Topics Concern   Not on file  Social History Narrative   Patient is right handed, resides alone, mother resides with him 1/2 the time,has dementia   Social Determinants of Health   Financial Resource Strain: Not on file  Food Insecurity: Not on file  Transportation Needs: Not on file  Physical Activity: Not on file  Stress: Not on file  Social Connections: Not on file    His Allergies Are:  No Known Allergies:   His Current Medications Are:  Outpatient Encounter Medications as of 12/11/2022  Medication Sig   apixaban (ELIQUIS) 5 MG TABS tablet TAKE 1  TABLET BY MOUTH TWICE DAILY   carbidopa-levodopa (SINEMET IR) 25-100 MG tablet Take one pill every 2.5 hours for a total of 6 to 7 times per day.   Cholecalciferol (VITAMIN D3 PO) Take 1 tablet by mouth daily.   Coenzyme Q10 (CO Q10 PO) Take 1 capsule by mouth daily.   diltiazem (CARDIZEM CD) 180 MG 24 hr capsule TAKE 1 CAPSULE BY MOUTH DAILY   gabapentin (NEURONTIN) 300 MG capsule Take 300 mg by mouth at bedtime.   pramipexole (MIRAPEX) 1 MG tablet Take 1 tablet (1 mg total) by mouth 3 (three) times daily.   rasagiline (AZILECT) 1 MG TABS tablet TAKE 1 TABLET BY MOUTH DAILY   tamsulosin (FLOMAX) 0.4 MG CAPS capsule Take 0.4 mg by mouth at bedtime.   HYDROmorphone (DILAUDID) 2 MG tablet Take 1 tablet (2 mg total) by mouth every 6 (six) hours as needed for up to 12 doses for severe pain.   methylPREDNISolone (MEDROL DOSEPAK) 4 MG TBPK tablet Use as directed   rosuvastatin (CRESTOR) 10 MG tablet Take 1 tablet (10 mg total) by mouth daily. If 10 mg is not tolerable due to muscle pain and stiffness, try taking 5 mg every day or every other day. (Patient not taking: Reported on 09/12/2022)   No facility-administered encounter medications on file as of 12/11/2022.  :  Review of Systems:  Out of a complete 14 point review of systems, all are reviewed and negative with the exception of these symptoms as listed below:  Review of Systems  Neurological:        Pt here for parkinson's  f/u  Pt states he has a lot of dyskinsia.     Objective:  Neurological Exam  Physical Exam Physical Examination:   Vitals:   12/11/22 0737  BP: 107/64  Pulse: 66    General Examination: The patient is a very pleasant 72 y.o. male in no acute distress. He appears well-developed and well-nourished and well groomed.  Mild generalized dyskinesias, fluctuating throughout the visit.  HEENT: Normocephalic, atraumatic, pupils are equal, tracking with mild difficulty.  Mild to moderate facial masking noted, mild to  moderate nuchal rigidity, slight forward tilt and slight left head tilt.   No lingual dyskinesias.  No involuntary facial movements.  Speech with mild to moderate hypophonia, slight dysarthria at times.   Chest: is clear to auscultation without wheezing, rhonchi or crackles noted.   Heart: sounds are regular and normal without murmurs, rubs or gallops  noted.   Abdomen: is soft, non-tender and non-distended.    Extremities: There is no pitting edema in the distal lower extremities bilaterally.   Skin: is warm and dry with no trophic changes noted.     Musculoskeletal: exam reveals no obvious joint deformities.   Neurologically: Mental status: The patient is awake and alert, paying good  attention. He is able to provide his history but his wife provides details.  Memory is generally well-preserved.  Speech is hypophonic with no dysarthria noted. Mood is congruent and affect is normal.     Cranial nerves are as described above under HEENT exam. In addition, shoulder shrug is normal but L shoulder is slightly lower than right.   Motor exam: Thin bulk, global strength normal for age, intermittent dyskinesias noted, mild to moderate, mostly in the lower body.  Tone is mildly rigid with presence of cogwheeling in the right upper extremity. There is overall mild to moderate bradykinesia. There is no obvious resting tremor.     Fine motor skills exam: Fine motor skills are mild to moderately impaired on the right and left, fairly stable.  Moderate impairment of foot taps on the right, mild to moderately impaired on the left.  No drift or rebound.   Cerebellar testing shows no dysmetria or intention tremor on finger to nose testing. There is no truncal or gait ataxia.   Sensory exam is intact to light touch in the UEs and LEs.   Gait, station and balance: He stands up from the seated position with minimal difficulty and does not need to push up with His hands. He needs no assistance. Posture is  moderately stooped for age and appears to be a little worse.  He walks without a walking aid, has some start hesitation and started steps on the right.  Turns with mild insecurity.  Decreased stride length and pace, no telltale shuffling.    Assessment and Plan:    In summary, JHAIR WITHERINGTON is a 72 year old male with an underlying medical history of hypertension and chest pain, status post hernia repair in 2012, coronary artery disease, atrial fibrillation, status post back surgery in February 2023, who presents for followup consultation of his right-sided predominant Parkinson's disease, akinetic-rigid type, with diagnosis dating back to 2007, symptoms perhaps dating back to 2006. With time, he has had mild progression, but had remained stable for years in the past. We have with time increased his Mirapex and also his Sinemet over time.  He tried Sinemet CR 25-100 mg strength in the past but he felt worse on it, he is willing to trial it again, he is advised to use it as needed up to twice daily, in addition to his regular levodopa doses.  He is currently taking carbidopa-levodopa 25-100 mg strength 1 pill every 2 and half hours starting somewhere around 4 or 5 AM typically and last dose around 8 PM.  His pramipexole was increased to 1.5 mg 3 times daily a few months ago but he noticed an increase in his dyskinesias, he is currently only taking the 1.5 mg at midday.  We mutually agreed to reduce it back to 1 mg 3 times daily at this time, maintain Azilect 1 mg once daily as well.   He had lower back surgery in late April 2024, he had prior surgery in February 2023 and before then he had a lumbar spine surgery in 2019.  He is currently not taking any Dilaudid or narcotic pain medication, he has  reduced his gabapentin to 300 mg nightly.  He is encouraged to stay active, he is advised to use his walker as needed at home and use a walking stick or hiking pole when he is out and about.  He has been taking  vitamin D supplementation, they are encouraged to get level checked with his next scheduled blood work with PCP.  He is advised to stay well-hydrated.  He is advised to follow-up with [name], NP in about 4 to 6 months, sooner if needed.  We may consider adding amantadine 100 mg twice daily for dyskinesias, I have discussed it with the patient but also reviewed possible side effects of this medication.  His dyskinesias are fluctuating on a day-to-day basis, not currently debilitating but definitely annoying.  I answered all their questions today and the patient and his wife are in agreement.  I spent 30 minutes in total face-to-face time and in reviewing records during pre-charting, more than 50% of which was spent in counseling and coordination of care, reviewing test results, reviewing medications and treatment regimen and/or in discussing or reviewing the diagnosis of PD, the prognosis and treatment options. Pertinent laboratory and imaging test results that were available during this visit with the patient were reviewed by me and considered in my medical decision making (see chart for details).

## 2022-12-25 ENCOUNTER — Telehealth: Payer: Self-pay | Admitting: Neurology

## 2022-12-25 MED ORDER — CARBIDOPA-LEVODOPA ER 25-100 MG PO TBCR: EXTENDED_RELEASE_TABLET | ORAL | 3 refills | Status: DC

## 2022-12-25 NOTE — Telephone Encounter (Signed)
Spoke with pt's wife karen (on [place]) and informed her that Carbidopa-Levodopa CR has been sent to [facility]. Take 1 tablet by mouth up to twice daily as needed. Clydie Braun was very Adult nurse. I told her Dr Frances Furbish will be here Wednesday, so Shanda Bumps NP sent the Rx to pharmacy. She was advised to call if she has any questions.

## 2022-12-25 NOTE — Telephone Encounter (Signed)
Pt's wife called stating that in the last appt it was discussed that a booster like medication was going to be called in for the pt to take with his carbidopa-levodopa (SINEMET IR) 25-100 MG tablet She would like to know when this will be called in for the pt. Please advise.

## 2022-12-25 NOTE — Telephone Encounter (Signed)
No problem. Order signed and sent to pharmacy.  Thank you.

## 2023-01-25 ENCOUNTER — Other Ambulatory Visit: Payer: Self-pay | Admitting: Cardiology

## 2023-01-25 DIAGNOSIS — I480 Paroxysmal atrial fibrillation: Secondary | ICD-10-CM

## 2023-01-25 NOTE — Telephone Encounter (Signed)
Eliquis 5mg  refill request received. Patient is 72 years old, weight-71.7kg, Crea-0.71 on 09/13/22, Diagnosis-Afib, and last seen by Bernadene Person via TeleVisit on 09/14/22. Dose is appropriate based on dosing criteria. Will send in refill to requested pharmacy.

## 2023-01-30 ENCOUNTER — Ambulatory Visit: Payer: Medicare Other | Admitting: Neurology

## 2023-02-14 DIAGNOSIS — M5126 Other intervertebral disc displacement, lumbar region: Secondary | ICD-10-CM | POA: Diagnosis not present

## 2023-03-23 DIAGNOSIS — L570 Actinic keratosis: Secondary | ICD-10-CM | POA: Diagnosis not present

## 2023-03-23 DIAGNOSIS — X32XXXA Exposure to sunlight, initial encounter: Secondary | ICD-10-CM | POA: Diagnosis not present

## 2023-03-23 DIAGNOSIS — D225 Melanocytic nevi of trunk: Secondary | ICD-10-CM | POA: Diagnosis not present

## 2023-03-23 DIAGNOSIS — Z1283 Encounter for screening for malignant neoplasm of skin: Secondary | ICD-10-CM | POA: Diagnosis not present

## 2023-03-23 DIAGNOSIS — D485 Neoplasm of uncertain behavior of skin: Secondary | ICD-10-CM | POA: Diagnosis not present

## 2023-03-25 NOTE — Progress Notes (Unsigned)
Cardiology Office Note:  .   Date:  03/26/2023  ID:  Chanda Busing, DOB 1950-10-03, MRN [id number] PCP: Lonie Peak, PA-C  [facility] Providers Cardiologist:  Armanda Magic, MD {   History of Present Illness: Marland Kitchen   Jack Cobb is a 72 y.o. male with a past medical history of CAD, PAF, hypertension, hyperlipidemia, and Parkinson's disease here for follow-up appointment.  Was last seen January 2024.  Presented with his wife.  His cognitive ability is affected by his Parkinson's and for this reason he does not want to add a statin which could affect his cognition and memory.  Also has lots of back and shoulder pain and does not want to add any medication that would worsen his pain.  Patient is wife report that he had never tried any statins they just read about the side effects and heard from people that could cause muscle and joint pain.  We did review lipid-lowering medications including statins and nonstatin's.  Today, he presents with a history of back surgeries and Parkinson's disease,  for a follow-up visit to discuss cholesterol management. He had previously been on a statin, but discontinued it due to concerns about memory issues and potential exacerbation of shoulder pain. He had also undergone back surgery twice in the past year, which made it difficult to discern whether the statin was causing any adverse effects. The patient expressed interest in non-statin options for cholesterol management, but was aware that insurance approval might require a trial of two statins first. He also reported occasional discomfort in his big toe, which he described as feeling like "carpet" or "pins and needles."   Reports no shortness of breath nor dyspnea on exertion. Reports no chest pain, pressure, or tightness. No edema, orthopnea, PND. Reports no palpitations.   Discussed the use of AI scribe software for clinical note transcription with the patient, who gave verbal consent to  proceed.   ROS: pertinent ROS in HPI  Studies Reviewed: Marland Kitchen       Lexiscan Myoview 06/29/21    Findings are consistent with no prior ischemia. The study is low risk.   No ST deviation was noted.   LV perfusion is abnormal. Defect 1: There is a small defect with mild reduction in uptake present in the mid to basal inferior location(s) that is partially reversible. There is normal wall motion in the defect area. Consistent with artifact caused by subdiaphragmatic activity.   Left ventricular function is normal. Nuclear stress EF: 59 %. The left ventricular ejection fraction is normal (55-65%). End diastolic cavity size is mildly enlarged. End systolic cavity size is normal.   Prior study not available for comparison.   Small size, mild intensity partially reversible basal to mid inferior perfusion defect, improved with stress upright imaging, suggestive of attenuation artifact. LVEF 59%, dilated LV with normal wall motion. This is a low risk study. No prior for comparison.     Physical Exam:   VS:  BP 104/72   Pulse 61   Ht 5\' 10"  (1.778 m)   Wt 157 lb 6.4 oz (71.4 kg)   SpO2 99%   BMI 22.58 kg/m    Wt Readings from Last 3 Encounters:  03/26/23 157 lb 6.4 oz (71.4 kg)  12/11/22 158 lb (71.7 kg)  09/19/22 166 lb 11.2 oz (75.6 kg)    GEN: Well nourished, well developed in no acute distress NECK: No JVD; No carotid bruits CARDIAC: RRR, no murmurs, rubs, gallops RESPIRATORY:  Clear  to auscultation without rales, wheezing or rhonchi  ABDOMEN: Soft, non-tender, non-distended EXTREMITIES:  No edema; No deformity   ASSESSMENT AND PLAN: .    CAD -No chest pain or shortness of breath today -Would continue current medication regimen including Eliquis 5 mg twice daily, Cardizem 180 mg daily, was on Crestor 10 mg daily but discontinued  Hyperlipidemia Patient previously on statin therapy but discontinued due to concerns about side effects and potential impact on memory and shoulder pain.  Discussed non-statin options including Repatha, Praluent, and Leqvio. -Check lipid panel and liver function today. -Consult with PharmD regarding insurance approval for non-statin options.  HTN -Blood pressure low normal today 104/72 -Continue low-sodium, heart healthy diet -Continue current medication regimen  Possible Gout/peripheral neuropathy Patient reports intermittent discomfort in the left  big toe, suggestive of gout. -Check uric acid level today and forward results to primary care provider for further management.  General Health Maintenance -Continue monitoring blood pressure and cholesterol levels. -Follow-up with primary care provider regarding potential gout diagnosis and management.      Dispo: He can follow-up in a year with Dr. Mayford Knife  Signed, Sharlene Dory, PA-C

## 2023-03-26 ENCOUNTER — Ambulatory Visit: Payer: Medicare Other | Attending: Physician Assistant | Admitting: Physician Assistant

## 2023-03-26 ENCOUNTER — Encounter: Payer: Self-pay | Admitting: Physician Assistant

## 2023-03-26 ENCOUNTER — Other Ambulatory Visit: Payer: Self-pay | Admitting: *Deleted

## 2023-03-26 VITALS — BP 104/72 | HR 61 | Ht 70.0 in | Wt 157.4 lb

## 2023-03-26 DIAGNOSIS — E785 Hyperlipidemia, unspecified: Secondary | ICD-10-CM

## 2023-03-26 DIAGNOSIS — I10 Essential (primary) hypertension: Secondary | ICD-10-CM | POA: Diagnosis not present

## 2023-03-26 DIAGNOSIS — I2510 Atherosclerotic heart disease of native coronary artery without angina pectoris: Secondary | ICD-10-CM | POA: Diagnosis not present

## 2023-03-26 NOTE — Patient Instructions (Addendum)
Medication Instructions:  Your physician recommends that you continue on your current medications as directed. Please refer to the Current Medication list given to you today.  *If you need a refill on your cardiac medications before your next appointment, please call your pharmacy*   Lab Work: TODAY:  LIPID, LFT, TYPE & SCREEN, & URIC ACID  If you have labs (blood work) drawn today and your tests are completely normal, you will receive your results only by: MyChart Message (if you have MyChart) OR A paper copy in the mail If you have any lab test that is abnormal or we need to change your treatment, we will call you to review the results.   Testing/Procedures: None ordered   You have been referred to Pharm D to discuss Repatha / Praluent  Follow-Up: At [facility], you and your health needs are our priority.  As part of our continuing mission to provide you with exceptional heart care, we have created designated Provider Care Teams.  These Care Teams include your primary Cardiologist (physician) and Advanced Practice Providers (APPs -  Physician Assistants and Nurse Practitioners) who all work together to provide you with the care you need, when you need it.  We recommend signing up for the patient portal called "MyChart".  Sign up information is provided on this After Visit Summary.  MyChart is used to connect with patients for Virtual Visits (Telemedicine).  Patients are able to view lab/test results, encounter notes, upcoming appointments, etc.  Non-urgent messages can be sent to your provider as well.   To learn more about what you can do with MyChart, go to [email or web address].    Your next appointment:   1 year(s)  Provider:   Armanda Magic, MD     Other Instructions

## 2023-03-26 NOTE — Addendum Note (Signed)
Addended by: Burnetta Sabin on: 03/26/2023 10:07 AM   Modules accepted: Orders

## 2023-03-27 LAB — HEPATIC FUNCTION PANEL
ALT: 6 [IU]/L (ref 0–44)
AST: 18 [IU]/L (ref 0–40)
Albumin: 4.3 g/dL (ref 3.8–4.8)
Alkaline Phosphatase: 106 [IU]/L (ref 44–121)
Bilirubin Total: 0.7 mg/dL (ref 0.0–1.2)
Bilirubin, Direct: 0.21 mg/dL (ref 0.00–0.40)
Total Protein: 6.6 g/dL (ref 6.0–8.5)

## 2023-03-27 LAB — LIPID PANEL
Chol/HDL Ratio: 3.1 ratio (ref 0.0–5.0)
Cholesterol, Total: 188 mg/dL (ref 100–199)
HDL: 60 mg/dL (ref >39)
LDL Chol Calc (NIH): 110 mg/dL — ABNORMAL HIGH (ref 0–99)
Triglycerides: 103 mg/dL (ref 0–149)
VLDL Cholesterol Cal: 18 mg/dL (ref 5–40)

## 2023-03-27 LAB — URIC ACID: Uric Acid: 3.1 mg/dL — ABNORMAL LOW (ref 3.8–8.4)

## 2023-04-10 ENCOUNTER — Ambulatory Visit: Payer: Medicare Other

## 2023-04-11 ENCOUNTER — Ambulatory Visit: Payer: Medicare Other | Attending: Cardiology | Admitting: Pharmacist

## 2023-04-11 DIAGNOSIS — I2510 Atherosclerotic heart disease of native coronary artery without angina pectoris: Secondary | ICD-10-CM

## 2023-04-11 DIAGNOSIS — E7800 Pure hypercholesterolemia, unspecified: Secondary | ICD-10-CM | POA: Diagnosis not present

## 2023-04-11 NOTE — Patient Instructions (Signed)
Medication options include Repatha or Nexlizet Lab orders are in for you to get labs in a few months Please call us at [phone] with any questions

## 2023-04-11 NOTE — Assessment & Plan Note (Addendum)
Assessment: Patient self-discontinued rosuvastatin 10mg  daily after experiencing joint pain/stiffness Patient is adhering to good lifestyle modifications to help reduce ASCVD LDL-C on 03/26/23 was 110 Discuss calcium score 201 (60th percentile) from 2 years ago and FFR 0.7 and the meaning of it. Medication would help stabilize and decrease progression of disease. Discuss LDL-C goals with patient and why his is <70.  Inform patient of ASCVD 10-year risk calculator percentage of 13% (ASCVD) and 10.9% (MESA) and its meaning. May be able to get Repatha approved under insurance if pt interested Patient wishes to not start medication at this time and requested lab work for the future  Plan: Labs in 2-3 months

## 2023-04-11 NOTE — Progress Notes (Unsigned)
Patient ID: BUEL LEMM                 DOB: [birth year]                    MRN: [id number]      HPI: Jack Cobb is a 72 y.o. male patient referred to lipid clinic by Jari Favre, PA-C. PMH is significant for CAD, PAF, HTN, HLD, and Parkinson's disease.   Patient was previously seen in PharmD clinic twice. Patient and wife had concerns about statins affecting cognitive ability as this is also affected by his Parkinson's. Last seen by PharmD 05/25/22 and agreed to start rosuvastatin.   He was seen by Jari Favre 11/4 and admitted that he had stopped rosuvastatin due to concerns about side effects and potential impact on memory and shoulder pain.   Patient presents today in clinic with wife. Patient has concern of worsening pain and memory loss while on a statin. Patient tried rosuvastatin in 05/2022 and reported joint pain and stiffness, where he self-discontinued the medication. He also reported that he took 5mg  of rosuvastatin and alternative day dosing but did not tolerate.   Most recent lipid panel on 03/26/23 was significant for LDL-C of 110. Reviewed options for lowering LDL cholesterol, including ezetimibe, PCSK-9 inhibitors, and inclisiran.  Discussed mechanisms of action, dosing, side effects and potential decreases in LDL cholesterol. Informed patient that his current insurance would likely not cover 100% of Leqvio. Could potentially get Repatha approved.  Discussed extensively with patient and wife about different LDL-C goals and the reasoning behind his goal of <70. Informed patient on calcium score of 201 (60th percentile) that was completed 2 years ago. Discussed what the calcium score and FFR mean. Informed patient of ASCVD 10-year risk calculator percentage of 13% (ASCVD) and 10.9% (MESA) and its meaning. He is adhering to good lifestyle modifications to reduce ASCVD risk. Patient expressed he does not wish to start any medication at this time. Patient requested to have lab work  again in the future.    Current Medications: none Intolerances: rosuvastatin 10mg  daily (joint pain/stiffness)  Risk Factors: CAD, hypertension, coronary calcium score of 201,premature CVD in brother and possibly father LDL-C goal: <70 ApoB goal:   Diet:  eats generally healthy   Exercise:  trys to stay active and busy in yard whole day, due to back pain does not do regular exercise   Family History: Father: heart disease at age of 77 and died at age of 24 from CHF; Mother: lived till 45 no major issues, Brother: stent put in at age of 10.   Social History:  Alcohol:none Smoking: never  Labs: Lipid Panel     Component Value Date/Time   CHOL 188 03/26/2023 1007   TRIG 103 03/26/2023 1007   HDL 60 03/26/2023 1007   CHOLHDL 3.1 03/26/2023 1007   LDLCALC 110 (H) 03/26/2023 1007   LDLDIRECT 85 09/27/2020 0856   LABVLDL 18 03/26/2023 1007    Past Medical History:  Diagnosis Date   CAD (coronary artery disease), native coronary artery 09/06/2020   Coronary CTA was done showing a coronary calcium score of 201 placing him at the 60th percentile for age and gender.  He was noted to have 75 to 99% stenosis of the mid LAD with scattered nonobstructive plaquing elsewhere.  FFR 0.7 in D1 and 0.73 at the apex>>medical management   HLD (hyperlipidemia) 09/06/2020   Hypertension    Parkinson disease (HCC)  Current Outpatient Medications on File Prior to Visit  Medication Sig Dispense Refill   carbidopa-levodopa (SINEMET IR) 25-100 MG tablet Take one pill every 2.5 hours for a total of 6 to 7 times per day. 630 tablet 3   Carbidopa-Levodopa ER (SINEMET CR) 25-100 MG tablet controlled release Take 1 tablet by mouth up to twice daily as needed. 60 tablet 3   Cholecalciferol (VITAMIN D3 PO) Take 1,000 Int'l Units by mouth daily.     Coenzyme Q10 (CO Q10 PO) Take 1 capsule by mouth daily.     diltiazem (CARDIZEM CD) 180 MG 24 hr capsule TAKE 1 CAPSULE BY MOUTH DAILY 90 capsule 3   ELIQUIS  5 MG TABS tablet TAKE 1 TABLET BY MOUTH TWICE DAILY 60 tablet 5   gabapentin (NEURONTIN) 300 MG capsule Take 300 mg by mouth at bedtime.     pramipexole (MIRAPEX) 1 MG tablet Take 1 tablet (1 mg total) by mouth 3 (three) times daily. 90 tablet 5   rasagiline (AZILECT) 1 MG TABS tablet TAKE 1 TABLET BY MOUTH DAILY 90 tablet 3   rosuvastatin (CRESTOR) 10 MG tablet Take 1 tablet (10 mg total) by mouth daily. If 10 mg is not tolerable due to muscle pain and stiffness, try taking 5 mg every day or every other day. 90 tablet 3   tamsulosin (FLOMAX) 0.4 MG CAPS capsule Take 0.4 mg by mouth at bedtime.     No current facility-administered medications on file prior to visit.    No Known Allergies  Assessment/Plan:  1. Hyperlipidemia -  HLD (hyperlipidemia) Assessment: Patient self-discontinued rosuvastatin 10mg  daily after experiencing joint pain/stiffness Patient is adhering to good lifestyle modifications to help reduce ASCVD LDL-C on 03/26/23 was 110 Discuss calcium score 201 (60th percentile) from 2 years ago and FFR 0.7 and the meaning of it. Medication would help stabilize and decrease progression of disease. Discuss LDL-C goals with patient and why his is <70.  Inform patient of ASCVD 10-year risk calculator percentage of 13% (ASCVD) and 10.9% (MESA) and its meaning. May be able to get Repatha approved under insurance if pt interested Patient wishes to not start medication at this time and requested lab work for the future  Plan: Labs in 2-3 months   Thank you,  Minette Brine, Student Pharmacist  Olene Floss, Pharm.D, BCACP, BCPS, CPP [facility] of [facility] [address], [place], [place] [address]  Phone: [phone]; Fax: [phone]

## 2023-04-16 NOTE — Progress Notes (Unsigned)
[facility] [address] [place]. [place] [address] [phone]       OFFICE FOLLOW UP NOTE  Mr. Jack Cobb Date of Birth:  1951-01-12 Medical Record Number:  [id number]    Primary neurologist: Dr. Frances Furbish Reason for visit: parkinson's disease    SUBJECTIVE:   CHIEF COMPLAINT:  No chief complaint on file.   Brief HPI:   Jack Cobb is a 72 y.o. 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  At visit on 01/25/2022, he continued on Sinemet IR 1 tab every 2.5 hrs, Mirapex 1 mg TID and Azilect 1 mg daily. Did report some wearing off in between Sinemet dosages but unable to tolerate taking every 2 hours. At visit on 07/27/2022, continued Sinemet and Azilect and increased Mirapex dosage due to wearing off symptoms with Sinemet and prior difficulty tolerating taking 1 tab every 2 hours.   At prior visit 12/11/2022 with Dr. Frances Furbish, noted worsening of dyskinesias on higher Mirapex dosage therefore recommended reducing back to 1 mg 3 times daily, continued Sinemet and Azilect at current dosages and added Sinemet extended release 1 pill twice daily as needed.  Recommended considering adding amantadine for dyskinesias if needed.   Interval history:     Continues on Sinemet 1 tab every 2.5 hours taking a total of 6-7 doses per day - still having Sinemet wearing off about 30 minutes or so before next dose, seems to be taking longer after next dose to take effect. At times, will wake up in the middle of the night and will take extra Sinemet tablet.  Continues on Mirapex and Azilect at above dosages.  He did undergo back surgery on 1/30 with Dr. Jordan Likes and gradually recovering. He does note over the past 1-2 months, he has noticed floaters in his vision bilaterally. Denies any pain, curtain type appearance in his vision or loss of vision. Has not yet been seen by ophthalmology.       ROS:   14 system review of systems  performed and negative with exception of those listed in HPI  PMH:  Past Medical History:  Diagnosis Date   CAD (coronary artery disease), native coronary artery 09/06/2020   Coronary CTA was done showing a coronary calcium score of 201 placing him at the 60th percentile for age and gender.  He was noted to have 75 to 99% stenosis of the mid LAD with scattered nonobstructive plaquing elsewhere.  FFR 0.7 in D1 and 0.73 at the apex>>medical management   HLD (hyperlipidemia) 09/06/2020   Hypertension    Parkinson disease (HCC)     PSH:  Past Surgical History:  Procedure Laterality Date   BACK SURGERY     2019.2023, january 2024 lumbar surgeries with dr. pool   HERNIA REPAIR     ventral hernia repair with mesh x2   LUMBAR LAMINECTOMY/DECOMPRESSION MICRODISCECTOMY Left 09/19/2022   Procedure: Microdiscectomy - left - Lumbar Four-Lumbar Five;  Surgeon: Julio Sicks, MD;  Location: [facility] OR;  Service: Neurosurgery;  Laterality: Left;  3C    Social History:  Social History   Socioeconomic History   Marital status: Married    Spouse name: Not on file   Number of children: 3   Years of education: 12th   Highest education level: Not on file  Occupational History   Occupation: Retired from tile work    Comment: retired  Tobacco Use   Smoking status: Never   Smokeless tobacco: Former  Types: Chew   Tobacco comments:    quit 20 years ago  Vaping Use   Vaping status: Never Used  Substance and Sexual Activity   Alcohol use: No   Drug use: No   Sexual activity: Not on file  Other Topics Concern   Not on file  Social History Narrative   Patient is right handed, resides alone, mother resides with him 1/2 the time,has dementia   Social Determinants of Health   Financial Resource Strain: Not on file  Food Insecurity: Not on file  Transportation Needs: Not on file  Physical Activity: Not on file  Stress: Not on file  Social Connections: Not on file  Intimate Partner Violence: Not on  file    Family History:  Family History  Problem Relation Age of Onset   Unexplained death Mother 21       was on coumadin, reason not known   Heart attack Father 39   Heart disease Brother        had 2 stents in his 66s   Parkinson's disease Neg Hx     Medications:   Current Outpatient Medications on File Prior to Visit  Medication Sig Dispense Refill   carbidopa-levodopa (SINEMET IR) 25-100 MG tablet Take one pill every 2.5 hours for a total of 6 to 7 times per day. 630 tablet 3   Carbidopa-Levodopa ER (SINEMET CR) 25-100 MG tablet controlled release Take 1 tablet by mouth up to twice daily as needed. 60 tablet 3   Cholecalciferol (VITAMIN D3 PO) Take 1,000 Int'l Units by mouth daily.     Coenzyme Q10 (CO Q10 PO) Take 1 capsule by mouth daily.     diltiazem (CARDIZEM CD) 180 MG 24 hr capsule TAKE 1 CAPSULE BY MOUTH DAILY 90 capsule 3   ELIQUIS 5 MG TABS tablet TAKE 1 TABLET BY MOUTH TWICE DAILY 60 tablet 5   gabapentin (NEURONTIN) 300 MG capsule Take 300 mg by mouth at bedtime.     pramipexole (MIRAPEX) 1 MG tablet Take 1 tablet (1 mg total) by mouth 3 (three) times daily. 90 tablet 5   rasagiline (AZILECT) 1 MG TABS tablet TAKE 1 TABLET BY MOUTH DAILY 90 tablet 3   rosuvastatin (CRESTOR) 10 MG tablet Take 1 tablet (10 mg total) by mouth daily. If 10 mg is not tolerable due to muscle pain and stiffness, try taking 5 mg every day or every other day. 90 tablet 3   tamsulosin (FLOMAX) 0.4 MG CAPS capsule Take 0.4 mg by mouth at bedtime.     No current facility-administered medications on file prior to visit.    Allergies:  No Known Allergies    OBJECTIVE:  Physical Exam  There were no vitals filed for this visit.   There is no height or weight on file to calculate BMI. No results found.   General: well developed, well nourished, pleasant elderly Caucasian male, seated, in no evident distress HEENT: head normocephalic and atraumatic.  Mild facial  masking. Cardiovascular: regular rate and rhythm, no murmurs Musculoskeletal: no deformity Skin:  no rash/petichiae Vascular:  Normal pulses all extremities   Neurologic Exam Mental Status: Awake and fully alert.  Mild to moderately hypophonic.  Oriented to place and time. Recent and remote memory intact. Attention span, concentration and fund of knowledge appropriate. Mood and affect appropriate.  Cranial Nerves: Pupils equal, briskly reactive to light. Extraocular movements mild saccadic breakdown without nystagmus, some limitation to upper gaze. Visual fields full to confrontation. Hearing intact.  Facial sensation intact. Face, tongue, palate moves normally and symmetrically.  Motor: Normal strength in all tested extremity muscles.  Mildly increased tone RUE with cogwheel rigidity.  Mild to moderate bradykinesia.  No evidence of tremor. Sensory.: intact to touch , pinprick , position and vibratory sensation.  Coordination: Rapid alternating movements mild to moderately impaired on the right, very slight on left. Gait and Station: Arises from chair without difficulty.  Posture is mildly stooped. Gait demonstrates slightly decreased stride length and decreased arm swing on the right, mild difficulty with turns.  No use of AD. Reflexes: 1+ and symmetric. Toes downgoing.         ASSESSMENT/PLAN: Jack Cobb is a 72 y.o. year old male   Parkinson disease, right sided predominant  Continue Sinemet IR 1 tab every 2.5 hours, prior difficulty tolerating taking every 2 hours.  Continue Sinemet ER 1 tab BID PRN Continue pramipexole 1 mg TID, worsening dyskinesias on higher dosage Continue Azilect 1 mg daily No benefit/intolerant with Sinemet CR, intolerant to higher dose of pramipexole Advised to follow-up with ophthalmology regarding eye floaters    Follow up in 3 months with Dr. Frances Furbish for further medication recommendations or call earlier if needed    CC:  PCP: Lonie Peak,  Cordelia Poche   [facility] provider: Dr. Frances Furbish  I spent 26 minutes of face-to-face and non-face-to-face time with patient and wife.  This included previsit chart review, lab review, study review, order entry, electronic health record documentation, patient education regarding above diagnoses and treatment plan and answered all other questions to patient and wife satisfaction   [name], AGNP-BC  [facility] [address] [place], [place] [address]  Phone [phone] Fax [phone] Note: This document was prepared with digital dictation and possible smart phrase technology. Any transcriptional errors that result from this process are unintentional.

## 2023-04-17 ENCOUNTER — Encounter: Payer: Self-pay | Admitting: Adult Health

## 2023-04-17 ENCOUNTER — Ambulatory Visit: Payer: Medicare Other | Admitting: Adult Health

## 2023-04-17 VITALS — BP 113/61 | HR 79 | Ht 70.0 in | Wt 157.2 lb

## 2023-04-17 DIAGNOSIS — G20B2 Parkinson's disease with dyskinesia, with fluctuations: Secondary | ICD-10-CM

## 2023-04-17 DIAGNOSIS — G20B1 Parkinson's disease with dyskinesia, without mention of fluctuations: Secondary | ICD-10-CM

## 2023-04-17 MED ORDER — CARBIDOPA-LEVODOPA 25-100 MG PO TABS
1.5000 | ORAL_TABLET | Freq: Every day | ORAL | 11 refills | Status: AC
Start: 2023-04-17 — End: ?

## 2023-04-17 MED ORDER — PRAMIPEXOLE DIHYDROCHLORIDE 1 MG PO TABS
1.0000 mg | ORAL_TABLET | Freq: Three times a day (TID) | ORAL | 5 refills | Status: DC
Start: 2023-04-17 — End: 2023-08-15

## 2023-04-17 NOTE — Patient Instructions (Addendum)
Your Plan:  Further discussed with Dr. Frances Furbish after our visit. She recommends the following:   Increase Sinemet IR dosage to 1.5 tablets 6x per day (about every 3 hours)  Stop Sinemet CR with increased IR dosage due to risk of developing worsening dyskinesias  Continue Azilect 1mg  daily  Continue Mirapex 1mg  three times daily  Referral placed to [facility] movement disorders clinic in [place] - please allow 1-2 weeks to be contacted. If you do not hear from them by that time, please let me know     Follow up in 4 months with Dr. Frances Furbish or call earlier if needed     Thank you for coming to see [place] at [facility]. I hope we have been able to provide you high quality care today.  You may receive a patient satisfaction survey over the next few weeks. We would appreciate your feedback and comments so that we may continue to improve ourselves and the health of our patients.

## 2023-04-18 ENCOUNTER — Telehealth: Payer: Self-pay | Admitting: Adult Health

## 2023-04-18 NOTE — Telephone Encounter (Signed)
Referral for neurology fax to [facility] as requested. Phone: [phone], Fax: [phone]

## 2023-05-14 DIAGNOSIS — Z139 Encounter for screening, unspecified: Secondary | ICD-10-CM | POA: Diagnosis not present

## 2023-05-14 DIAGNOSIS — Z9181 History of falling: Secondary | ICD-10-CM | POA: Diagnosis not present

## 2023-05-14 DIAGNOSIS — Z0000 Encounter for general adult medical examination without abnormal findings: Secondary | ICD-10-CM | POA: Diagnosis not present

## 2023-07-18 ENCOUNTER — Other Ambulatory Visit: Payer: Self-pay | Admitting: Cardiology

## 2023-07-20 DIAGNOSIS — G629 Polyneuropathy, unspecified: Secondary | ICD-10-CM | POA: Diagnosis not present

## 2023-07-20 DIAGNOSIS — I480 Paroxysmal atrial fibrillation: Secondary | ICD-10-CM | POA: Diagnosis not present

## 2023-07-20 DIAGNOSIS — Z1159 Encounter for screening for other viral diseases: Secondary | ICD-10-CM | POA: Diagnosis not present

## 2023-07-20 DIAGNOSIS — I2510 Atherosclerotic heart disease of native coronary artery without angina pectoris: Secondary | ICD-10-CM | POA: Diagnosis not present

## 2023-07-20 DIAGNOSIS — Z0000 Encounter for general adult medical examination without abnormal findings: Secondary | ICD-10-CM | POA: Diagnosis not present

## 2023-07-20 DIAGNOSIS — E7800 Pure hypercholesterolemia, unspecified: Secondary | ICD-10-CM | POA: Diagnosis not present

## 2023-07-20 DIAGNOSIS — G20A1 Parkinson's disease without dyskinesia, without mention of fluctuations: Secondary | ICD-10-CM | POA: Diagnosis not present

## 2023-07-20 DIAGNOSIS — I10 Essential (primary) hypertension: Secondary | ICD-10-CM | POA: Diagnosis not present

## 2023-07-23 ENCOUNTER — Other Ambulatory Visit: Payer: Self-pay | Admitting: Cardiology

## 2023-07-23 DIAGNOSIS — I480 Paroxysmal atrial fibrillation: Secondary | ICD-10-CM

## 2023-07-23 NOTE — Telephone Encounter (Signed)
 Prescription refill request for Eliquis received. Indication: CAD Last office visit: 03/26/23  T Conte PA-C Scr: 0.71 on 09/13/22  Epic Age: 73 Weight: 71.4kg  Based on above findings Eliquis 5mg  twice daily is the appropriate dose.  Refill approved.

## 2023-08-15 ENCOUNTER — Ambulatory Visit: Payer: Medicare Other | Admitting: Neurology

## 2023-08-15 ENCOUNTER — Encounter: Payer: Self-pay | Admitting: Neurology

## 2023-08-15 VITALS — BP 105/66 | HR 66 | Ht 70.0 in | Wt 160.6 lb

## 2023-08-15 DIAGNOSIS — R29898 Other symptoms and signs involving the musculoskeletal system: Secondary | ICD-10-CM | POA: Diagnosis not present

## 2023-08-15 DIAGNOSIS — G20B2 Parkinson's disease with dyskinesia, with fluctuations: Secondary | ICD-10-CM | POA: Diagnosis not present

## 2023-08-15 DIAGNOSIS — K5900 Constipation, unspecified: Secondary | ICD-10-CM | POA: Diagnosis not present

## 2023-08-15 DIAGNOSIS — R293 Abnormal posture: Secondary | ICD-10-CM

## 2023-08-15 DIAGNOSIS — R29818 Other symptoms and signs involving the nervous system: Secondary | ICD-10-CM

## 2023-08-15 DIAGNOSIS — I499 Cardiac arrhythmia, unspecified: Secondary | ICD-10-CM

## 2023-08-15 DIAGNOSIS — G20B1 Parkinson's disease with dyskinesia, without mention of fluctuations: Secondary | ICD-10-CM

## 2023-08-15 MED ORDER — PRAMIPEXOLE DIHYDROCHLORIDE 1 MG PO TABS
1.0000 mg | ORAL_TABLET | Freq: Two times a day (BID) | ORAL | 5 refills | Status: DC
Start: 2023-08-15 — End: 2024-01-10

## 2023-08-15 NOTE — Patient Instructions (Signed)
 We will reduce your pramipexole to 1 mg twice daily. Please keep your appointment with [facility] neurology coming up in May. Please follow-up with Shanda Bumps in about 8 months from now. I will refer you to neuro rehab for OT and PT evaluation and possible treatment.

## 2023-08-15 NOTE — Progress Notes (Signed)
 Subjective:    Patient ID: Jack Cobb is a 73 y.o. male.  HPI    Interim history:   Jack Cobb is a 73 year old right-handed gentleman, with an underlying medical history of hypertension, HLP, PAF, CAD, s/p hernia repair in 2012, and s/p multiple back surgeries, who presents for follow-up consultation of his right-sided predominant Parkinson's disease, complicated by dyskinesias, low back pain with status post surgery in April 2024, neck pain, and constipation.  He was last seen in this clinic by Jack Austin, Jack Cobb in November 2024, at which time he reported increased stiffness and difficulty with ambulation as well as balance issues.  He was advised to increase Sinemet IR to 1-1/2 tablets 6 times a day, 3 hourly.  He was advised to stop Sinemet CR, he was advised to continue with Azilect and pramipexole 1 mg 3 times daily.  A referral to an academic center was discussed and initiated.  Today, 72/26/2025: He reports that he has good days and bad days, sometimes the pramipexole does not seem to help.  He is not sure if it is counteracting the levodopa even.  He has not fallen thankfully.  He tries to stay active and tries to hydrate well.  Some forgetfulness is reported but no major cognitive issues.  He has limited his driving.  He would be open to pursuing therapy with neurorehab.  He takes levodopa 1-1/2 pills 6 times a day.  Continues to take Azilect once daily.  He has some neck discomfort.  He has noticed decreased mobility in the neck.  He continues to have intermittent dyskinesias. He is scheduled to see neurology at [facility] in May 2025.   His wife is wondering about nicotine and Parkinson's disease.  I advised her that side effects of nicotine are higher than potential benefits.  His wife also read up on riboflavin and biotin and Parkinson's disease.  They are advised that he can take over-the-counter riboflavin and biotin if he would like, I would not recommend a higher dose.   The  patient's allergies, current medications, family history, past medical history, past social history, past surgical history and problem list were reviewed and updated as appropriate.    Previously (copied from previous notes for reference):   04/17/2023 Jack Austin, Jack Cobb): <<Brief HPI: QUAVION BOULE is a 73 y.o. 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  At visit on 01/25/2022, he continued on Sinemet IR 1 tab every 2.5 hrs, Mirapex 1 mg TID and Azilect 1 mg daily. Did report some wearing off in between Sinemet dosages but unable to tolerate taking every 2 hours. At visit on 07/27/2022, continued Sinemet and Azilect and increased Mirapex dosage due to wearing off symptoms with Sinemet and prior difficulty tolerating taking 1 tab every 2 hours.  At prior visit 12/11/2022 with Dr. Frances Furbish, noted worsening of dyskinesias on higher Mirapex dosage therefore recommended reducing back to 1 mg 3 times daily, continued Sinemet and Azilect at current dosages and added Sinemet extended release 1 pill twice daily as needed.  Recommended considering adding amantadine for dyskinesias if needed.  Interval history: Returns today for follow-up accompanied by his wife. He reports continued gradual decline since prior visit more so in regards to stiffness and ambulation (imbalance, unsteadiness).  Feels intermittent benefit with Sinemet every 2.5 hours, sometimes dose works, sometimes no benefit, sometimes takes a while to take effect. Needs to ensure he is moving after taking as he has noticed limited  to no benefit if not, sometimes will use peanut butter after dose with benefit. No noticeable benefit with Sinemet CR as needed. Previously tried to take Sinemet CR nightly but after several days had worsening internal tremor/jitters.  Improvement of dyskinesias since lowering pramipexole dosage, currently on 1mg  TID. Still has some dyskinesias more so in his mouth and feet but not  noticeable or bothersome to patient. Remains on Azilect 1mg  daily, tolerating well. Tries to stay as active as he can, will use cane as needed, no recent falls. >>   12/11/2022 ([place]): He reports not doing all that great.  He does not have much in the way of pain, is reducing his gabapentin, is currently on 300 mg at bedtime and takes Tylenol as needed.  His dyskinesias have generally become worse.  Also, he feels that that levodopa does not last 2-1/2 hours each dose.  He does not have a pattern as to when his mobility becomes worse or the levodopa does not last as long.  His dyskinesias seem to be worse in the afternoons.  He continues to stay active.  He is outside a lot.  His wife had him start an over-the-counter vitamin D supplement, she is unsure of the exact dose.  Of note, he had back surgery with microdiscectomy at L4-5 under Dr. Jordan Likes on 09/19/2022. Since he started vitamin D, his constipation is better.  His weight fluctuates a little bit.   I saw him on 07/21/2021, at which time he reported feeling fairly stable.  He was having dyskinesias.  He reported intermittent constipation.  He had undergone recent back surgery.  He was advised to take Sinemet every 2-1/2 hours for total of 6-7 doses, he was no longer taking Sinemet CR.     He saw Jack Austin, Jack Cobb on 07/27/2022, at which time he reported floaters in his eyes.  He was encouraged to see his ophthalmologist.  He was advised to increase pramipexole to 1.5 mg 3 times daily and continue with Azilect once daily and Sinemet 1 pill every 2 and half hours.      He saw Jack Austin, Jack Cobb on 01/26/2022, at which time he reported feeling stable.  He continued to take Sinemet every 2-1/2 hours.  He had tried taking it closer together but did not feel well with regimen.   I saw him on 11/02/2020, at which time he felt less stable on his medication for Parkinson's disease.  He was followed by cardiology for his new onset atrial fibrillation.  He was diagnosed in  January 2022.  He was advised to add Sinemet CR up to twice daily and continue with his Sinemet IR.  He was also advised to continue with Azilect and Mirapex at the current doses.  He had started diltiazem and Eliquis through cardiology.  He was advised to try melatonin at night for sleep.   His wife called in the interim in October 2022 reporting that patient had COVID symptoms and tested positive for COVID-19.  He was advised to proceed to urgent care or emergency room for acute management of his COVID.         I saw him on 05/04/2020, at which time he reported not trying the Sinemet CR as we had talked about before.  He was encouraged to try Sinemet CR at bedtime and keep his regular levodopa the same.  He was advised to follow-up routinely in 6 months, he was considering the COVID-vaccine.   I saw him on 10/07/2019, at  which time he felt more or less stable, had filled the Sinemet CR to be taken at bedtime but did not actually take it.  I did encourage him to started and we mutually agreed to keep his Sinemet, Mirapex and Azilect the same.     I saw him on 04/09/2019, at which time he was taking Sinemet 6 times a day about 2-1/2 hourly, reported more difficulty getting started in the morning, more slowness.  He was advised to add Sinemet CR at bedtime.    I saw him in a virtual visit on 10/02/2018, at which time he reported feeling fairly stable.  He had some intermittent issues with constipation and his balance and mobility.  He was advised to continue with Sinemet 1 pill 6 times a day and Mirapex 1 pill 3 times a day as well as Azilect 1 pill once daily.    I saw him on 04/03/2018, at which time he reported doing well, he had had lumbar spine surgery under Dr. Dutch Quint in July 2019 and had done well after it.  He was not on any pain medication.  He was trying to stay active.  Sinemet was 1 pill 5 or 6 times a day, more consistently 5 times a day.  He was taking Mirapex twice daily 1 mg strength and  sometimes an additional half pill during the middle of the day.  He had tried hemp for back pain which he found helpful, he was wondering about the usefulness of CBD oil.  I suggested we keep his medication regimen the same, including Azilect 1 mg once daily in the morning, Mirapex and Sinemet.  We talked about potentially doing speech therapy evaluation.  I saw him on 10/01/2017, at which time he reported doing okay from a Parkinson's standpoint, however, he started having more problems with shooting pains to the right leg and had an L spine MRI in December 2018. He started seeing a spine specialist. He had received injections which provided temporary relief.    I saw him on 04/02/2017, at which time he had issues with constipation, no cognitive issues, no recent falls, he did report that he may have pulled a muscle in the right hip area. We mutually agreed to continue his current medication regimen of Azilect once daily, Mirapex ER once daily and Sinemet 1 pill 5 times a day.   He called in the interim reporting that the Mirapex ER was to expensive and we switched him over to Mirapex IR 1 mg 3 times a day.   I saw him on 09/27/2016, at which time he reported doing okay but felt like his levodopa was wearing off after 3 hours. He was taking half a pill in the morning, 1 pill midday, then another full pill about 3-4 hours later and half a pill in the evening. Is not exercising on a regular basis but overall was fairly active. He had some constipation intermittently. Memory was stable, some forgetfulness reported, some mood irritability was reported by his girlfriend. He was getting his Mirapex ER brand-name from [place]. He was taking Azilect generic once daily. I suggested we continue with Mirapex ER 3 mg daily and Azilect 1 mg daily. I suggested we increase his Sinemet to 1 pill 5 times a day. I asked him to exercise more regularly and be proactive about constipation issues.   Of note, the patient no  showed for appointment on 07/12/2016 secondary to illness. I saw him on 01/10/2016, at which time he reported  worsening of his posture in more stiffness. He had no significant problems with balance or constipation or mood. He was taking overall a low dose of Sinemet. I suggested he could continue with the current regimen but increase his second dose also to a whole pill. He was taking half a pill for 3 doses and 1 whole pill at 3 PM. I suggested he could try to increase his Sinemet. Overall, he felt fairly stable but I did notice some changes on exam as well.   I saw him on 07/12/2015, at which time he reported feeling stable. He did not notice much in the way of difference after the increase in the Mirapex long-acting. Overall, he was less stressed. He was able to transition his mother took class nursing home and she did quite well given her age of 4. He was sleeping a little better not having to worry about his mother on the time. He was taking Sinemet half a pill 4 times a day sometimes a whole pill at 3 PM, sometimes would get only 3 doses and for the day. He is trying to stay active and trying to do better with his water intake as opposed to coffee and soda. I suggested he could consider alternating 1 pill with half a pill for a total of 3 pills daily, but he preferred to stay on the regimen of about 2 and half pills daily.   I saw him on 01/06/2015, at which time he reported doing fairly well overall. He was active. He had some increased stiffness particularly in the mornings and later in the evenings. He would sometimes forget his evening dose of Sinemet. He still had some reservations about taking Sinemet because of fear of potential long-term effects and side effects. We mutually agreed to keep his Sinemet at 3 half pills a day for 3 doses, and one whole pill around 3 PM. He was advised to continue with Azilect once daily as well. I suggested he increase his Mirapex ER from 2.25 mg to 3 mg once daily.  He was on brand-name Mirapex ER.   I saw him on 07/08/2014, at which time he reported more tremors, especially towards the end of the day. He reported more fatigue, and stiffness and slowness after 3 PM. He was taking Azilect once daily and brand-name Mirapex long-acting. He was taking Sinemet half a pill 3 times a day. He continued to be very active and take care of his elderly mother, 8 years old, alternating her care with his brother. I suggested he continue with Azilect once daily and Mirapex ER but he increase Sinemet to half a pill at 7 AM, half a pill at 11 AM, 1 whole pill at 3 PM and half a pill at 7 PM.   I saw him on 01/05/2014, at which time he reported feeling worse with his posture and his walking was slower. He felt stable with his mood and memory. He had mild constipation. He continued to work here and there. He reported no issues with driving. His mother continue to stay with him every other week and he was sharing care of her with his brother. His mother has dementia and is in her mid 25s. I suggested he continue with Azilect and Mirapex ER at the same doses but and a half pill of levodopa to his regimen.   I saw him on 06/16/2013, at which time he reported feeling stable, with the exception of occasional start hesitation and trouble turning. I encouraged  him to increase his exercise to more daily walking exercises and kept his medications the same.   I first met him on 12/12/2012, at which time I felt that his exam was stable. I did not make any medication changes at the time but suggested he consider taking coenzyme Q 10.     He previously followed by Dr. Avie Echevaria and was last seen by him on 06/14/2012, and which time Dr. Sandria Manly felt that he was doing very well. He did not make any changes to his medication regimen.   He was diagnosed with idiopathic right-sided predominant Parkinson's disease in May 2007 and initially treated with long-acting Mirapex, rasagiline and levodopa. He  exercises regularly. He has had some daytime somnolence. He snores at night. In February 2013 he went on disability. He denied compulsive behavior, orthostatic hypotension or edema. He has had some difficulty with his memory. His mother has dementia at age 72. He lives alone, and takes care of his mother, who stays with him one week and with his only brother one week. He stays active and tends to sleep better when his mother stays with his brother as he can rest more and relax more. He has had no depression, anxiety, hallucinations, delusions or major memory problems, or dyskinesias.  He had tried the Sinemet CR for about 7 to 10 days but felt jittery afterwards, has not taken it since January 2022. He continues to take Azilect once daily, Mirapex 3 times a day and Sinemet IR 1 pill 6 times a day, stopping around 6 AM every 2-1/2 hours.  Bedtime is generally between 9 and 11 PM.    His Past Medical History Is Significant For: Past Medical History:  Diagnosis Date   CAD (coronary artery disease), native coronary artery 09/06/2020   Coronary CTA was done showing a coronary calcium score of 201 placing him at the 60th percentile for age and gender.  He was noted to have 75 to 99% stenosis of the mid LAD with scattered nonobstructive plaquing elsewhere.  FFR 0.7 in D1 and 0.73 at the apex>>medical management   HLD (hyperlipidemia) 09/06/2020   Hypertension    Parkinson disease (HCC)     His Past Surgical History Is Significant For: Past Surgical History:  Procedure Laterality Date   BACK SURGERY     2019.2023, january 2024 lumbar surgeries with dr. pool   HERNIA REPAIR     ventral hernia repair with mesh x2   LUMBAR LAMINECTOMY/DECOMPRESSION MICRODISCECTOMY Left 09/19/2022   Procedure: Microdiscectomy - left - Lumbar Four-Lumbar Five;  Surgeon: Julio Sicks, MD;  Location: [facility] OR;  Service: Neurosurgery;  Laterality: Left;  3C    His Family History Is Significant For: Family History  Problem  Relation Age of Onset   Unexplained death Mother 69       was on coumadin, reason not known   Heart attack Father 26   Heart disease Brother        had 2 stents in his 43s   Parkinson's disease Maternal Uncle     His Social History Is Significant For: Social History   Socioeconomic History   Marital status: Married    Spouse name: Not on file   Number of children: 3   Years of education: 12th   Highest education level: Not on file  Occupational History   Occupation: Retired from tile work    Comment: retired  Tobacco Use   Smoking status: Never   Smokeless tobacco: Former  Types: Chew   Tobacco comments:    quit 20 years ago  Vaping Use   Vaping status: Never Used  Substance and Sexual Activity   Alcohol use: No   Drug use: No   Sexual activity: Not on file  Other Topics Concern   Not on file  Social History Narrative   Patient is right handed, resides alone, mother resides with him 1/2 the time,has dementia   Social Drivers of Corporate investment banker Strain: Not on file  Food Insecurity: Not on file  Transportation Needs: Not on file  Physical Activity: Not on file  Stress: Not on file  Social Connections: Not on file    His Allergies Are:  No Known Allergies:   His Current Medications Are:  Outpatient Encounter Medications as of 08/15/2023  Medication Sig   carbidopa-levodopa (SINEMET IR) 25-100 MG tablet Take 1.5 tablets by mouth 6 (six) times daily. Take 1 pill 6 times daily, about every 3 hours.   Cholecalciferol (VITAMIN D3 PO) Take 1,000 Int'l Units by mouth daily.   Coenzyme Q10 (CO Q10 PO) Take 1 capsule by mouth daily.   diltiazem (CARDIZEM CD) 180 MG 24 hr capsule Take 1 capsule (180 mg total) by mouth daily.   ELIQUIS 5 MG TABS tablet TAKE 1 TABLET BY MOUTH TWICE DAILY   gabapentin (NEURONTIN) 300 MG capsule Take 300 mg by mouth at bedtime.   pramipexole (MIRAPEX) 1 MG tablet Take 1 tablet (1 mg total) by mouth 3 (three) times daily.    rasagiline (AZILECT) 1 MG TABS tablet TAKE 1 TABLET BY MOUTH DAILY   tamsulosin (FLOMAX) 0.4 MG CAPS capsule Take 0.4 mg by mouth at bedtime.   rosuvastatin (CRESTOR) 10 MG tablet Take 1 tablet (10 mg total) by mouth daily. If 10 mg is not tolerable due to muscle pain and stiffness, try taking 5 mg every day or every other day. (Patient not taking: Reported on 04/17/2023)   No facility-administered encounter medications on file as of 08/15/2023.  :  Review of Systems:  Out of a complete 14 point review of systems, all are reviewed and negative with the exception of these symptoms as listed below:  Review of Systems  Neurological:        Pt is her with his Wife. Pt states that his Parkinson's has gotten worse. Pt states that he is freezing more when he goes to get up and then start walking. Pt states that his neck won't turn anymore. Pt has history of Parkinson's in his family, an Uncle. Pt's wife would like to discuss vitamin b 2 and b 7, and biotin to help Parkinson's.    Objective:  Neurological Exam  Physical Exam Physical Examination:   Vitals:   08/15/23 1038  BP: 105/66  Pulse: 66    General Examination: The patient is a very pleasant 73 y.o. male in no acute distress. He appears mildly deconditioned, generalized dyskinesias noted.   HEENT: Normocephalic, atraumatic, pupils are equal, tracking with mild difficulty.  Moderate facial masking noted, mild to moderate nuchal rigidity, slight forward tilt and slight left head tilt. No lingual dyskinesias.  No involuntary facial movements.  Speech with mild to moderate hypophonia, slight dysarthria at times.  Tongue protrudes centrally.  No carotid bruits.   Chest: is clear to auscultation without wheezing, rhonchi or crackles noted.   Heart: sounds are slightly irregular.     Abdomen: is soft, non-tender and non-distended.    Extremities: There is no pitting  edema in the distal lower extremities bilaterally.   Skin: is warm and  dry with no trophic changes noted.     Musculoskeletal: exam reveals no obvious joint deformities.   Neurologically: Mental status: The patient is awake and alert, paying good  attention. He is able to provide his history but his wife provides details.  Memory is generally well-preserved.  Speech is hypophonic with no dysarthria noted. Mood is congruent and affect is normal.     Cranial nerves are as described above under HEENT exam. In addition, shoulder shrug is normal but L shoulder is slightly lower than right.   Motor exam: Thin bulk, global strength normal for age, mild to moderate generalized dyskinesias noted.  Tone is mildly rigid with presence of cogwheeling in the right upper extremity. There is overall mild to oderate bradykinesia. There is no obvious resting tremor.     Fine motor skills exam: Fine motor skills are moderately impaired on the right and slightly better on the left. Moderate impairment of foot taps on the right, mild to moderately impaired on the left.   Cerebellar testing shows no dysmetria or intention tremor on finger to nose testing. There is no truncal or gait ataxia.   Sensory exam is intact to light touch in the UEs and LEs.   Gait, station and balance: He stands up from the seated position with mild difficulty, posture is moderately stooped.  He walks without a walking aid, has some start hesitation and started steps on the right.  Turns with mild insecurity.  Decreased stride length and pace, no telltale shuffling.    Assessment and Plan:    In summary, Jack Cobb is a 73 year old right-handed gentleman, with an underlying medical history of hypertension, HLP, PAF, CAD, s/p hernia repair in 2012, and s/p multiple back surgeries, who presents for follow-up consultation of his right-sided predominant Parkinson's disease, complicated by dyskinesias, low back pain with status post surgery in April 2024, neck pain, and constipation.    His symptoms dating  back to 2006, diagnosis dates back to 2007.  He is currently on Sinemet IR 1-1/2 tablets 6 times a day, 3 hourly. We stopped Sinemet CR in November 2024.  He is advised to reduce his pramipexole to 1 mg twice daily from currently 3 times daily.  He is advised to continue with Azilect once daily.  We talked about the importance of fall prevention and maintaining a healthy lifestyle again today. Would like to make a referral to neuro rehab for PT and OT evaluations and possible therapy.  He is advised to keep his appointment with [facility]y coming up in May 2025.   He can certainly start taking over-the-counter riboflavin and biotin but I would avoid taking any nicotine products.  He is advised to follow-up in this clinic to see Jack Austin, Jack Cobb in about 8 months, sooner if needed.  I answered all their questions today and the patient and his wife were in agreement.  I spent 40 minutes in total face-to-face time and in reviewing records during pre-charting, more than 50% of which was spent in counseling and coordination of care, reviewing test results, reviewing medications and treatment regimen and/or in discussing or reviewing the diagnosis of PD, the prognosis and treatment options. Pertinent laboratory and imaging test results that were available during this visit with the patient were reviewed by me and considered in my medical decision making (see chart for details).

## 2023-08-21 ENCOUNTER — Other Ambulatory Visit: Payer: Self-pay

## 2023-08-21 ENCOUNTER — Ambulatory Visit: Attending: Neurology | Admitting: Physical Therapy

## 2023-08-21 ENCOUNTER — Ambulatory Visit: Admitting: Occupational Therapy

## 2023-08-21 ENCOUNTER — Encounter: Payer: Self-pay | Admitting: Physical Therapy

## 2023-08-21 ENCOUNTER — Telehealth: Payer: Self-pay | Admitting: Occupational Therapy

## 2023-08-21 ENCOUNTER — Encounter: Payer: Self-pay | Admitting: Occupational Therapy

## 2023-08-21 DIAGNOSIS — G20B2 Parkinson's disease with dyskinesia, with fluctuations: Secondary | ICD-10-CM | POA: Diagnosis not present

## 2023-08-21 DIAGNOSIS — R278 Other lack of coordination: Secondary | ICD-10-CM | POA: Diagnosis not present

## 2023-08-21 DIAGNOSIS — R29818 Other symptoms and signs involving the nervous system: Secondary | ICD-10-CM

## 2023-08-21 DIAGNOSIS — R41841 Cognitive communication deficit: Secondary | ICD-10-CM | POA: Insufficient documentation

## 2023-08-21 DIAGNOSIS — M5459 Other low back pain: Secondary | ICD-10-CM | POA: Insufficient documentation

## 2023-08-21 DIAGNOSIS — R2681 Unsteadiness on feet: Secondary | ICD-10-CM | POA: Insufficient documentation

## 2023-08-21 DIAGNOSIS — R29898 Other symptoms and signs involving the musculoskeletal system: Secondary | ICD-10-CM | POA: Insufficient documentation

## 2023-08-21 DIAGNOSIS — R1310 Dysphagia, unspecified: Secondary | ICD-10-CM | POA: Insufficient documentation

## 2023-08-21 DIAGNOSIS — R2689 Other abnormalities of gait and mobility: Secondary | ICD-10-CM | POA: Diagnosis not present

## 2023-08-21 DIAGNOSIS — R471 Dysarthria and anarthria: Secondary | ICD-10-CM | POA: Diagnosis not present

## 2023-08-21 DIAGNOSIS — R293 Abnormal posture: Secondary | ICD-10-CM | POA: Insufficient documentation

## 2023-08-21 NOTE — Therapy (Signed)
 OUTPATIENT OCCUPATIONAL THERAPY PARKINSON'S EVALUATION  Patient Name: Jack Cobb MRN: [id number] DOB:December 31, 1950, 73 y.o., male Today's Date: 08/21/2023  PCP: Lonie Peak, PA-C  REFERRING PROVIDER: Huston Foley, MD  END OF SESSION:  OT End of Session - 08/21/23 1151     Visit Number 1    Authorization Type UHC Medicare    OT Start Time 1152    Activity Tolerance Patient tolerated treatment well    Behavior During Therapy [facility] for tasks assessed/performed             Past Medical History:  Diagnosis Date   CAD (coronary artery disease), native coronary artery 09/06/2020   Coronary CTA was done showing a coronary calcium score of 201 placing him at the 60th percentile for age and gender.  He was noted to have 75 to 99% stenosis of the mid LAD with scattered nonobstructive plaquing elsewhere.  FFR 0.7 in D1 and 0.73 at the apex>>medical management   HLD (hyperlipidemia) 09/06/2020   Hypertension    Parkinson disease [facility])    Past Surgical History:  Procedure Laterality Date   BACK SURGERY     2019.2023, january 2024 lumbar surgeries with dr. pool   HERNIA REPAIR     ventral hernia repair with mesh x2   LUMBAR LAMINECTOMY/DECOMPRESSION MICRODISCECTOMY Left 09/19/2022   Procedure: Microdiscectomy - left - Lumbar Four-Lumbar Five;  Surgeon: Julio Sicks, MD;  Location: [facility] OR;  Service: Neurosurgery;  Laterality: Left;  3C   Patient Active Problem List   Diagnosis Date Noted   Recurrent herniation of lumbar disc 09/19/2022   CAD (coronary artery disease), native coronary artery 09/06/2020   HLD (hyperlipidemia) 09/06/2020   PAF (paroxysmal atrial fibrillation) (HCC) 06/13/2020   Essential hypertension 06/13/2020   Elevated troponin 06/13/2020   Parkinson's disease (HCC) 12/12/2012    ONSET DATE: 08/15/2023 (Date of referral); has been dx with PD since 2007  REFERRING DIAG:  G20.B1 (ICD-10-CM) - Parkinson's disease with dyskinesia, without mention of fluctuations   G20.B2 (ICD-10-CM) - Parkinson's disease with dyskinesia, with fluctuations  K59.00 (ICD-10-CM) - Constipation, unspecified  R29.818 (ICD-10-CM) - Other symptoms and signs involving the nervous system  R29.898 (ICD-10-CM) - Other symptoms and signs involving the musculoskeletal system  I49.9 (ICD-10-CM) - Cardiac arrhythmia, unspecified  R29.3 (ICD-10-CM) - Abnormal posture   THERAPY DIAG:  No diagnosis found.  Rationale for Evaluation and Treatment: Rehabilitation  SUBJECTIVE:   SUBJECTIVE STATEMENT: Pt reports he has not had therapy in the past. He and his wife do think he could benefit from SLP as his speech has become quieter.   Pt accompanied by: significant other - Karen  PERTINENT HISTORY: PMH - hypertension, HLD, PAF, CAD, s/p hernia repair in 2012, and s/p multiple back surgeries   PRECAUTIONS: Fall  WEIGHT BEARING RESTRICTIONS: No  PAIN:  Are you having pain? Yes: NPRS scale: 3-4/10 Pain location: neck Pain description: stiff; numb Aggravating factors: turning head Relieving factors: medications; CBD cream  FALLS: Has patient fallen in last 6 months? No  LIVING ENVIRONMENT: Lives with: lives with their spouse Lives in: House/apartment Stairs: Yes: Internal: 15 steps; can reach both and External: 15 steps; can reach both Has following equipment at home: Single point cane and Walker - 2 wheeled  PLOF: Independent; retired [employer]; not driving for the last year  PATIENT GOALS: maintain current level of function  OBJECTIVE:  Note: Objective measures were completed at Evaluation unless otherwise noted.  HAND DOMINANCE: Right  ADLs: Overall ADLs: mod I  IADLs: Light housekeeping: mod I Meal Prep: mod I Community mobility: dependent Medication management: setup Financial management: mod I Handwriting: 50% legible and Severe micrographia  MOBILITY STATUS: Independent - uses cane  POSTURE COMMENTS:  rounded shoulders and forward  head  ACTIVITY TOLERANCE: Activity tolerance: good  FUNCTIONAL OUTCOME MEASURES: Fastening/unfastening 3 buttons: 43 seconds Physical performance test: PPT#2 (simulated eating) 12 seconds & PPT#4 (donning/doffing jacket): 10 seconds  COORDINATION: 9 Hole Peg test: Right: 31 sec; Left: 29 sec Box and Blocks:  Right 47 blocks, Left 50 blocks Tremors: Resting, action, Right, Left, and head  UE ROM:  WFL  UE MMT:   WFL  SENSATION: WFL  MUSCLE TONE: WFL  COGNITION: Overall cognitive status: Within functional limits for tasks assessed  OBSERVATIONS: Bradykinesia, Dyskinesias, and Postural tremors                                                                                                                   TREATMENT:   OT educated pt and spouse on PD symptoms and recommended therapies to maximize functional status and safety.   OT recommended pt bring in a binder to store PD HEP and handouts.   PATIENT EDUCATION: Education details: OT role and POC: PD management Person educated: Patient and Spouse Education method: Explanation Education comprehension: verbalized understanding and needs further education  HOME EXERCISE PROGRAM: N/A for this visit  GOALS:  SHORT TERM GOALS: Target date: 09/18/2023    Pt will be independent with PD specific HEP.  Baseline: not yet initiated Goal status: INITIAL  2.  Pt will verbalize understanding of adapted strategies to maximize safety and independence with ADLs/IADLs.  Baseline: not yet initiated Goal status: INITIAL  LONG TERM GOALS: Target date: 09/26/2023    Pt will verbalize understanding of ways to prevent future PD related complications and PD community resources.  Baseline: not yet initiated Goal status: INITIAL  2.  Pt will write a short paragraph with no significant decrease in size and maintain 100% legibility.  Baseline: 50% legibility with moderate micrographia Goal status: INITIAL  3.  Pt will verbalize  understanding of ways to keep thinking skills sharp and ways to compensate for STM changes in the future.  Baseline: not yet initiated Goal status: INITIAL  4.  Pt will demonstrate improved ease with fastening buttons as evidenced by decreasing 3 button/unbutton time to 35 seconds or less.  Baseline: 43 seconds Goal status: INITIAL  5.  Pt will be able to place at least 50 blocks using R hand with completion of Box and Blocks test.  Baseline: 47 blocks Goal status: INITIAL  ASSESSMENT:  CLINICAL IMPRESSION: Patient is a 73 y.o. male who was seen today for occupational therapy evaluation for PD. Hx includes hypertension, HLD, PAF, CAD, s/p hernia repair in 2012, and s/p multiple back surgeries . Patient currently presents below baseline level of functioning demonstrating functional deficits and impairments as noted below. Pt would benefit from skilled OT services in the outpatient setting to work on impairments as  noted below to help pt return to PLOF as able.    PERFORMANCE DEFICITS: in functional skills including ADLs, IADLs, coordination, Fine motor control, Gross motor control, and UE functional use.   IMPAIRMENTS: are limiting patient from ADLs, IADLs, and leisure.   COMORBIDITIES:  may have co-morbidities  that affects occupational performance. Patient will benefit from skilled OT to address above impairments and improve overall function.  MODIFICATION OR ASSISTANCE TO COMPLETE EVALUATION: Min-Moderate modification of tasks or assist with assess necessary to complete an evaluation.  OT OCCUPATIONAL PROFILE AND HISTORY: Detailed assessment: Review of records and additional review of physical, cognitive, psychosocial history related to current functional performance.  CLINICAL DECISION MAKING: Moderate - several treatment options, min-mod task modification necessary  REHAB POTENTIAL: Fair given PD prognosis  EVALUATION COMPLEXITY: Moderate    PLAN:  OT FREQUENCY:  2x/week  OT DURATION: 4 weeks (extended to 9 total visits due to scheduling issues)  PLANNED INTERVENTIONS: 16109 OT Re-evaluation, 97535 self care/ADL training, 60454 therapeutic exercise, 97530 therapeutic activity, 97112 neuromuscular re-education, functional mobility training, and patient/family education  RECOMMENDED OTHER SERVICES: Recommend ST referral for evaluation and treatment of cognition and speech.   CONSULTED AND AGREED WITH PLAN OF CARE: Patient and family member/caregiver  PLAN FOR NEXT SESSION: Initiate PWR! Hands   Delana Meyer, OT 08/21/2023, 11:57 AM

## 2023-08-21 NOTE — Telephone Encounter (Signed)
 Dr. Frances Cobb, Jack Cobb was evaluated by OT and PT today.  The patient would benefit from ST evaluation for cognition and speech.   If you agree, please place an order in [facility] workque in [facility] or fax the order to [phone]. Thank you,  Shelbie Proctor, OTR/L  [facility] [address] [place], [place]  [address] Phone:  [phone] Fax:  [phone]

## 2023-08-21 NOTE — Therapy (Signed)
 OUTPATIENT PHYSICAL THERAPY NEURO EVALUATION   Patient Name: Jack Cobb MRN: [id number] DOB:December 19, 1950, 73 y.o., male Today's Date: 08/21/2023   PCP: Lonie Peak, PA-C REFERRING PROVIDER: Huston Foley, MD  END OF SESSION:  PT End of Session - 08/21/23 1233     Visit Number 1    Number of Visits 13    Date for PT Re-Evaluation 10/16/23    Authorization Type UHC Medicare    PT Start Time 1231    PT Stop Time 1309    PT Time Calculation (min) 38 min    Activity Tolerance Patient tolerated treatment well    Behavior During Therapy WFL for tasks assessed/performed             Past Medical History:  Diagnosis Date   CAD (coronary artery disease), native coronary artery 09/06/2020   Coronary CTA was done showing a coronary calcium score of 201 placing him at the 60th percentile for age and gender.  He was noted to have 75 to 99% stenosis of the mid LAD with scattered nonobstructive plaquing elsewhere.  FFR 0.7 in D1 and 0.73 at the apex>>medical management   HLD (hyperlipidemia) 09/06/2020   Hypertension    Parkinson disease [facility])    Past Surgical History:  Procedure Laterality Date   BACK SURGERY     2019.2023, january 2024 lumbar surgeries with dr. pool   HERNIA REPAIR     ventral hernia repair with mesh x2   LUMBAR LAMINECTOMY/DECOMPRESSION MICRODISCECTOMY Left 09/19/2022   Procedure: Microdiscectomy - left - Lumbar Four-Lumbar Five;  Surgeon: Julio Sicks, MD;  Location: [facility] OR;  Service: Neurosurgery;  Laterality: Left;  3C   Patient Active Problem List   Diagnosis Date Noted   Recurrent herniation of lumbar disc 09/19/2022   CAD (coronary artery disease), native coronary artery 09/06/2020   HLD (hyperlipidemia) 09/06/2020   PAF (paroxysmal atrial fibrillation) (HCC) 06/13/2020   Essential hypertension 06/13/2020   Elevated troponin 06/13/2020   Parkinson's disease (HCC) 12/12/2012    ONSET DATE: 08/15/2023 (referral)  REFERRING DIAG: G20.B2 (ICD-10-CM) -  Parkinson's disease with dyskinesia and fluctuating manifestations (HCC) G20.B1 (ICD-10-CM) - Dyskinesia due to Parkinson's disease (HCC) K59.00 (ICD-10-CM) - Constipation, unspecified constipation type I49.9 (ICD-10-CM) - Irregular heartbeat R29.818,R29.898 (ICD-10-CM) - Fine motor impairment R29.3 (ICD-10-CM) - Posture abnormality  THERAPY DIAG:  Abnormal posture  Other abnormalities of gait and mobility  Unsteadiness on feet  Parkinson's disease with dyskinesia, with fluctuations (HCC)  Rationale for Evaluation and Treatment: Rehabilitation  SUBJECTIVE:  SUBJECTIVE STATEMENT: Pt presents w/wooden cane. Reports his meds are due in 30 minutes, so dyskinesias are worse. States he uses the cane in the community and his RW at night when his dyskinesias are really bad. Takes 1.5 pills of Carbidopa-Levodopa every 3 hours. Remains very active despite his dyskinesias. Has significant freezing as well, especially w/turns and in doorways. Denies falls but has had several near misses.   Pt accompanied by:  Wife, Clydie Braun   PERTINENT HISTORY: hypertension, HLD, PAF, CAD, s/p hernia repair in 2012, and s/p multiple back surgeries. Diagnosed w/PD in 2007   PAIN:  Are you having pain?  Pt reports he always has pain, unable to specify where. Mostly in his neck, hips and knees   PRECAUTIONS: Fall  RED FLAGS: None   WEIGHT BEARING RESTRICTIONS: No  FALLS: Has patient fallen in last 6 months?  No, but several near misses   LIVING ENVIRONMENT: Lives with: lives with their spouse Lives in: House/apartment Stairs: Yes: Internal: 15 steps; on right going up and External: 15 steps; on right going up, on left going up, and can reach both Has following equipment at home: Single point cane and Walker - 2 wheeled  PLOF:  Independent  PATIENT GOALS: " Stabilize"   OBJECTIVE:  Note: Objective measures were completed at Evaluation unless otherwise noted.  DIAGNOSTIC FINDINGS: MRI of Lumbar spine from 08/2022  IMPRESSION: 1. New prominent marrow edema in the left sacral ala, suspicious for insufficiency fracture. 2. New left subarticular disc extrusion at L4-L5 potentially affecting the descending left L5 nerve root. 3. Interval posterior decompression at L5-S1 with improved now mild left lateral recess stenosis at L5-S1. 4. Unchanged severe bilateral neuroforaminal stenosis at L4-L5 and L5-S1.  COGNITION: Overall cognitive status: Within functional limits for tasks assessed   SENSATION: Pt reports frequent numbness of L foot, thinks due to history of low back surgeries.   COORDINATION: Heel to shin test: WNL bilaterally    POSTURE: rounded shoulders, forward head, and difficult to assess due to dyskinesias   LOWER EXTREMITY ROM:     Active  Right Eval Left Eval  Hip flexion    Hip extension    Hip abduction    Hip adduction    Hip internal rotation    Hip external rotation    Knee flexion    Knee extension    Ankle dorsiflexion    Ankle plantarflexion    Ankle inversion    Ankle eversion     (Blank rows = not tested)  LOWER EXTREMITY MMT:  Tested in seated position   MMT Right Eval Left Eval  Hip flexion 5 5  Hip extension    Hip abduction 5 5  Hip adduction 5 5  Hip internal rotation    Hip external rotation    Knee flexion 5 5  Knee extension 5 5  Ankle dorsiflexion 5 5  Ankle plantarflexion    Ankle inversion    Ankle eversion    (Blank rows = not tested)  BED MOBILITY:  Independent per pt   TRANSFERS: Assistive device utilized: None  Sit to stand: Modified independence Stand to sit: Modified independence Chair to chair: Modified independence   GAIT: Gait pattern: step through pattern, decreased stride length, shuffling, festinating, lateral hip  instability, trunk flexed, and wide BOS Distance walked: Various clinic distances  Assistive device utilized: Single point cane and None Level of assistance: SBA and CGA Comments: Significant freezing w/turns and walking into doorways. Pt w/forward flexed posture  and fighting his freeze, requiring CGA to avoid anterior LOB   FUNCTIONAL TESTS:   [address] [facility] PT Assessment - 08/21/23 1252       Balance   Balance Assessed Yes      Standardized Balance Assessment   Standardized Balance Assessment 10 meter walk test;Timed Up and Go Test    10 Meter Walk 1.06 m/s   3m over 9.43s no AD, SBA     Timed Up and Go Test   Normal TUG (seconds) 11.12   no AD, significant freezing w/turn   Cognitive TUG (seconds) 16.46   Retro counting by 3s             PATIENT SURVEYS:  Freezing of gait questionnaire 14/24                                                                                                                              TREATMENT DATE:   Self-care/home management  Briefly discussed freezing strategies - to be further reviewed in future sessions  Discussed whether he should come to PT during on or off periods. Informed wife that therapy would like to see him during both, as this is pt's real life and we can be more helpful if we see pt during his off periods as well.    PATIENT EDUCATION: Education details: POC, eval findings  Person educated: Patient and Spouse Education method: Medical illustrator Education comprehension: verbalized understanding  HOME EXERCISE PROGRAM: To be established   GOALS: Goals reviewed with patient? Yes  SHORT TERM GOALS: Target date: 09/18/2023   Pt will be independent with initial HEP for improved strength, balance, transfers and gait.  Baseline: not established on eval  Goal status: INITIAL  2.  Pt will verbalize and demonstrate freezing strategies to implement at home and in community for improved mobility and reduced fall risk   Baseline:  Goal status: INITIAL  3.  MiniBest to be assessed and LTG updated  Baseline:  Goal status: INITIAL   LONG TERM GOALS: Target date: 10/02/2023   MiniBest goal  Baseline:  Goal status: INITIAL  2.  Pt will score </= 10/24 on FOGQ for improved freezing strategies and reduced fall risk  Baseline:  Goal status: INITIAL   ASSESSMENT:  CLINICAL IMPRESSION: Patient is a 73 year old male referred to Neuro OPPT for PD.   Pt's PMH is significant for: hypertension, HLD, PAF, CAD, s/p hernia repair in 2012, and s/p multiple back surgeries. The following deficits were present during the exam: significant dyskinesias, freezing of gait and postural deficits. Based on dyskinesias and freezing, pt is an incr risk for falls. Pt would benefit from skilled PT to address these impairments and functional limitations to maximize functional mobility independence.    OBJECTIVE IMPAIRMENTS: Abnormal gait, decreased balance, decreased knowledge of use of DME, decreased mobility, difficulty walking, impaired sensation, postural dysfunction, pain, and dyskinesias     ACTIVITY LIMITATIONS: carrying, lifting, bending, standing, squatting,  transfers, locomotion level, and caring for others  PARTICIPATION LIMITATIONS: medication management, driving, shopping, community activity, and yard work  PERSONAL FACTORS: Fitness, Past/current experiences, and 1 comorbidity: PD w/dyskinesias  are also affecting patient's functional outcome.   REHAB POTENTIAL: Good  CLINICAL DECISION MAKING: Evolving/moderate complexity  EVALUATION COMPLEXITY: Moderate  PLAN:  PT FREQUENCY: 2x/week  PT DURATION: 6 weeks  PLANNED INTERVENTIONS: 97164- PT Re-evaluation, 97110-Therapeutic exercises, 97530- Therapeutic activity, [id number]- Neuromuscular re-education, 97535- Self Care, 78295- Manual therapy, [id number]- Gait training, [phone]- Aquatic Therapy, Patient/Family education, Balance training, Dry Needling, Vestibular  training, and DME instructions  PLAN FOR NEXT SESSION: Assess MiniBest and update goal. Educate and practice freezing strategies. Initial HEP (standing or sitting PWR?). Postural deficits    Jill Alexanders Kree Armato, PT, DPT 08/21/2023, 1:10 PM

## 2023-08-22 NOTE — Telephone Encounter (Signed)
 ST eval request through neuro rehab, per OT recs.

## 2023-08-27 ENCOUNTER — Encounter: Payer: Self-pay | Admitting: Physical Therapy

## 2023-08-27 ENCOUNTER — Ambulatory Visit: Admitting: Occupational Therapy

## 2023-08-27 ENCOUNTER — Encounter: Payer: Self-pay | Admitting: Occupational Therapy

## 2023-08-27 ENCOUNTER — Ambulatory Visit: Admitting: Physical Therapy

## 2023-08-27 VITALS — BP 111/60 | HR 60

## 2023-08-27 DIAGNOSIS — R471 Dysarthria and anarthria: Secondary | ICD-10-CM | POA: Diagnosis not present

## 2023-08-27 DIAGNOSIS — R29818 Other symptoms and signs involving the nervous system: Secondary | ICD-10-CM | POA: Diagnosis not present

## 2023-08-27 DIAGNOSIS — R2689 Other abnormalities of gait and mobility: Secondary | ICD-10-CM | POA: Diagnosis not present

## 2023-08-27 DIAGNOSIS — R29898 Other symptoms and signs involving the musculoskeletal system: Secondary | ICD-10-CM | POA: Diagnosis not present

## 2023-08-27 DIAGNOSIS — R293 Abnormal posture: Secondary | ICD-10-CM

## 2023-08-27 DIAGNOSIS — R278 Other lack of coordination: Secondary | ICD-10-CM

## 2023-08-27 DIAGNOSIS — R1310 Dysphagia, unspecified: Secondary | ICD-10-CM | POA: Diagnosis not present

## 2023-08-27 DIAGNOSIS — G20B2 Parkinson's disease with dyskinesia, with fluctuations: Secondary | ICD-10-CM | POA: Diagnosis not present

## 2023-08-27 DIAGNOSIS — R2681 Unsteadiness on feet: Secondary | ICD-10-CM | POA: Diagnosis not present

## 2023-08-27 DIAGNOSIS — M5459 Other low back pain: Secondary | ICD-10-CM | POA: Diagnosis not present

## 2023-08-27 NOTE — Therapy (Signed)
 OUTPATIENT PHYSICAL THERAPY NEURO TREATMENT   Patient Name: Jack Cobb MRN: [id number] DOB:30-May-1950, 73 y.o., male Today's Date: 08/27/2023   PCP: Lonie Peak, PA-C REFERRING PROVIDER: Huston Foley, MD  END OF SESSION:  PT End of Session - 08/27/23 0851     Visit Number 2    Number of Visits 13    Date for PT Re-Evaluation 10/16/23    Authorization Type [employer] Medicare    PT Start Time 0848    PT Stop Time 0930    PT Time Calculation (min) 42 min    Equipment Utilized During Treatment Gait belt    Activity Tolerance Patient tolerated treatment well    Behavior During Therapy WFL for tasks assessed/performed             Past Medical History:  Diagnosis Date   CAD (coronary artery disease), native coronary artery 09/06/2020   Coronary CTA was done showing a coronary calcium score of 201 placing him at the 60th percentile for age and gender.  He was noted to have 75 to 99% stenosis of the mid LAD with scattered nonobstructive plaquing elsewhere.  FFR 0.7 in D1 and 0.73 at the apex>>medical management   HLD (hyperlipidemia) 09/06/2020   Hypertension    Parkinson disease [facility])    Past Surgical History:  Procedure Laterality Date   BACK SURGERY     2019.2023, january 2024 lumbar surgeries with dr. pool   HERNIA REPAIR     ventral hernia repair with mesh x2   LUMBAR LAMINECTOMY/DECOMPRESSION MICRODISCECTOMY Left 09/19/2022   Procedure: Microdiscectomy - left - Lumbar Four-Lumbar Five;  Surgeon: Julio Sicks, MD;  Location: [facility];  Service: Neurosurgery;  Laterality: Left;  3C   Patient Active Problem List   Diagnosis Date Noted   Recurrent herniation of lumbar disc 09/19/2022   CAD (coronary artery disease), native coronary artery 09/06/2020   HLD (hyperlipidemia) 09/06/2020   PAF (paroxysmal atrial fibrillation) (HCC) 06/13/2020   Essential hypertension 06/13/2020   Elevated troponin 06/13/2020   Parkinson's disease (HCC) 12/12/2012    ONSET DATE: 08/15/2023  (referral)  REFERRING DIAG: G20.B2 (ICD-10-CM) - Parkinson's disease with dyskinesia and fluctuating manifestations (HCC) G20.B1 (ICD-10-CM) - 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 Patient reports that his medications are not due until about 10:30 today. Patient denies any falls or near falls since last here. Patient wants to work on stabilize and freezing.   Pt accompanied by: self and   Wife, Clydie Braun in lobby who comes back towards end of session per patient request   PERTINENT HISTORY: hypertension, HLD, PAF, CAD, s/p hernia repair in 2012, and s/p multiple back surgeries. Diagnosed w/PD in 2007   PAIN:  Are you having pain?  Pt reports he always has pain, unable to specify where. Mostly in his neck, hips and knees   PRECAUTIONS: Fall  RED FLAGS: None   WEIGHT BEARING RESTRICTIONS: No  FALLS: Has patient fallen in last 6 months?  No, but several near misses   LIVING ENVIRONMENT: Lives with: lives with their spouse Lives in: House/apartment Stairs: Yes: Internal: 15 steps; on right going up and External: 15 steps; on right going up, on left going up, and can reach both Has following equipment at home: Single point cane and Walker - 2 wheeled  PLOF: Independent  PATIENT GOALS: " Stabilize"   OBJECTIVE:  Note: Objective measures were completed at Evaluation unless otherwise  noted.  DIAGNOSTIC FINDINGS: MRI of Lumbar spine from 08/2022  IMPRESSION: 1. New prominent marrow edema in the left sacral ala, suspicious for insufficiency fracture. 2. New left subarticular disc extrusion at L4-L5 potentially affecting the descending left L5 nerve root. 3. Interval posterior decompression at L5-S1 with improved now mild left lateral recess stenosis at L5-S1. 4. Unchanged severe bilateral neuroforaminal stenosis at L4-L5 and L5-S1.  COGNITION: Overall cognitive status: Within functional limits for tasks assessed   SENSATION: Pt reports frequent numbness of L foot, thinks due to history of low back surgeries.   COORDINATION: Heel to shin test: WNL bilaterally    POSTURE: rounded shoulders, forward head, and difficult to assess due to dyskinesias   LOWER EXTREMITY ROM:     Active  Right Eval Left Eval  Hip flexion    Hip extension    Hip abduction    Hip adduction    Hip internal rotation    Hip external rotation    Knee flexion    Knee extension    Ankle dorsiflexion    Ankle plantarflexion    Ankle inversion    Ankle eversion     (Blank rows = not tested)  LOWER EXTREMITY MMT:  Tested in seated position   MMT Right Eval Left Eval  Hip flexion 5 5  Hip extension    Hip abduction 5 5  Hip adduction 5 5  Hip internal rotation    Hip external rotation    Knee flexion 5 5  Knee extension 5 5  Ankle dorsiflexion 5 5  Ankle plantarflexion    Ankle inversion    Ankle eversion    (Blank rows = not tested)  BED MOBILITY:  Independent per pt   TRANSFERS: Assistive device utilized: None  Sit to stand: Modified independence Stand to sit: Modified independence Chair to chair: Modified independence   GAIT: Gait pattern: step through pattern, decreased stride length, shuffling, festinating, lateral hip instability, trunk flexed, and wide BOS Distance walked: Various clinic distances  Assistive device utilized: Single point cane and  None Level of assistance: SBA and CGA Comments: Significant freezing w/turns and walking into doorways. Pt w/forward flexed posture and fighting his freeze, requiring CGA to avoid anterior LOB   PATIENT SURVEYS:  Freezing of gait  questionnaire 14/24                                                                                                                              TREATMENT     Self Care: Vitals:   08/27/23 0855  BP: 111/60  Pulse: 60  Vitals assessed seated on RUE, WFL for therapy Discussed POC including completing testing and would continue to evaluate for freezing Discussed causes of freezing but will work on management strategies more in the future  TherAct:  [facility] PT Assessment - 08/27/23 0001       Standardized Balance Assessment   Standardized Balance Assessment Mini-BESTest      Mini-BESTest   Sit To Stand Normal: Comes to stand without use of hands and stabilizes independently.    Rise to Toes Normal: Stable for 3 s with maximum height.    Stand on one leg (left) Moderate: < 20 s   4 seconds   Stand on one leg (right) Moderate: < 20 s   6 seconds   Stand on one leg - lowest score 1    Compensatory Stepping Correction - Forward Moderate: More than one step is required to recover equilibrium   3 steps   Compensatory Stepping Correction - Backward Moderate: More than one step is required to recover equilibrium   7 mini steps   Compensatory Stepping Correction - Left Lateral Moderate: Several steps to recover equilibrium   3 steps   Compensatory Stepping Correction - Right Lateral Moderate: Several steps to recover equilibrium   4 steps (1 big and 3 little)   Stepping Corredtion Lateral - lowest score 1    Stance - Feet together, eyes open, firm surface  Normal: 30s    Stance - Feet together, eyes closed, foam surface  Moderate: < 30s   6 seconds   Incline - Eyes Closed Moderate: Stands independently < 30s OR aligns with surface   notably increased forward flexed  posture but able to achieve 30 seconds   Change in Gait Speed Moderate: Unable to change walking speed or signs of imbalance    Walk with head turns - Horizontal Moderate: performs head turns with reduction in gait speed.    Walk with pivot turns Severe: Cannot turn with feet close at any speed without imbalance.   freezing with every attempted turn   Step over obstacles Moderate: Steps over box but touches box OR displays cautious behavior by slowing gait.   slows gait   Timed UP & GO with Dual Task Moderate: Dual Task affects either counting OR walking (>10%) when compared to the TUG without Dual Task.   see TUGs from eval 11.12 regular and 16.46 seconds with TUG cog   Mini-BEST total score 16             Mini-Best: 16/28 = increased risk for falls (multiple episodes of freezing with pivot turns throughout session), Patient uses cane for walking in session  between tasks and without AD for dynamic portion of miniBEST  NMR:  Pt performs PWR! Moves in standing position with chair in front 1 x 10 reps mirroring therapist to maximize amplitude of movements, provided as HEP but recommend review in future sessions    PWR! Up for improved posture  PWR! Rock for improved weighshifting  PWR! Twist for improved trunk rotation   PWR! Step for improved step initiation   Cues provided for proper weight shift with PWR! Rock and looking to arm to [employer] rotation, tolerated well without increase in back pain    PATIENT EDUCATION: Education details: PWR! Moves + MiniBEST results  Person educated: Patient and Spouse Education method: Medical illustrator Education comprehension: verbalized understanding  HOME EXERCISE PROGRAM: Standing PWR! Moves with chair in front 2 x 10 reps 3x week   GOALS: Goals reviewed with patient? Yes  SHORT TERM GOALS: Target date: 09/18/2023   Pt will be independent with initial HEP for improved strength, balance, transfers and gait.  Baseline: not  established on eval  Goal status: INITIAL  2.  Pt will verbalize and demonstrate freezing strategies to implement at home and in community for improved mobility and reduced fall risk  Baseline:  Goal status: INITIAL  3.  MiniBest to be assessed and LTG updated  Baseline: assessed on 4/7 Goal status: MET   LONG TERM GOALS: Target date: 10/02/2023   Patient will improve miniBEST score to 20/28 to indicate progress towards a decreased risk of falls and improved dynamic and static stability.   Baseline: 16/28 Goal status: INITIAL  2.  Pt will score </= 10/24 on FOGQ for improved freezing strategies and reduced fall risk  Baseline:  Goal status: INITIAL   ASSESSMENT:  CLINICAL IMPRESSION: Emphasis on skilled physical therapy session on completion of miniBEST and initiation of standing PWR! Moves for HEP. Patient with greatest challenges on miniBEST with stepping strategy, dynamic gait, freezing with turns, dual task, and single leg stance stability. Will plan to address in future sessions. Patient tolerated PWR! Moves well in today's session with appropriate weight shift and sequencing without increase in pain but recommend review in future sessions. Continue POC.    OBJECTIVE IMPAIRMENTS: Abnormal gait, decreased balance, decreased knowledge of use of DME, decreased mobility, difficulty walking, impaired sensation, postural dysfunction, pain, and dyskinesias     ACTIVITY LIMITATIONS: carrying, lifting, bending, standing, squatting, transfers, locomotion level, and caring for others  PARTICIPATION LIMITATIONS: medication management, driving, shopping, community activity, and yard work  PERSONAL FACTORS: Fitness, Past/current experiences, and 1 comorbidity: PD w/dyskinesias  are also affecting patient's functional outcome.   REHAB POTENTIAL: Good  CLINICAL DECISION MAKING: Evolving/moderate complexity  EVALUATION COMPLEXITY: Moderate  PLAN:  PT FREQUENCY: 2x/week  PT  DURATION: 6 weeks  PLANNED INTERVENTIONS: 97164- PT Re-evaluation, 97110-Therapeutic exercises, 97530- Therapeutic activity, 97112- Neuromuscular re-education, 97535- Self Care, 60454- Manual therapy, [phone]- Gait training, [phone]- Aquatic Therapy, Patient/Family education, Balance training, Dry Needling, Vestibular training, and DME instructions  PLAN FOR NEXT SESSION: Educate and practice freezing strategies most notable with turns, AD recommendations as appropriate (uses cane during day sometimes and rollator sometimes at night and no AD other times), review PWR! Moves, stepping strategy (all directions were challenging on miniBEST)   Carmelia Bake, PT, DPT 08/27/2023, 1:52 PM

## 2023-08-27 NOTE — Therapy (Signed)
 OUTPATIENT OCCUPATIONAL THERAPY PARKINSON'S TREATMENT  Patient Name: Jack Cobb MRN: [id number] DOB:[birth year], 73 y.o., male Today's Date: 08/27/2023  PCP: Lonie Peak, PA-C  REFERRING PROVIDER: Huston Foley, MD  END OF SESSION:  OT End of Session - 08/27/23 0933     Visit Number 2    Number of Visits 9    Date for OT Re-Evaluation 09/26/23    Authorization Type [employer] Medicare - Berkley Harvey req    OT Start Time 0932    OT Stop Time 1015    OT Time Calculation (min) 43 min    Activity Tolerance Patient tolerated treatment well    Behavior During Therapy [facility] for tasks assessed/performed             Past Medical History:  Diagnosis Date   CAD (coronary artery disease), native coronary artery 09/06/2020   Coronary CTA was done showing a coronary calcium score of 201 placing him at the 60th percentile for age and gender.  He was noted to have 75 to 99% stenosis of the mid LAD with scattered nonobstructive plaquing elsewhere.  FFR 0.7 in D1 and 0.73 at the apex>>medical management   HLD (hyperlipidemia) 09/06/2020   Hypertension    Parkinson disease [facility])    Past Surgical History:  Procedure Laterality Date   BACK SURGERY     2019.2023, january 2024 lumbar surgeries with dr. pool   HERNIA REPAIR     ventral hernia repair with mesh x2   LUMBAR LAMINECTOMY/DECOMPRESSION MICRODISCECTOMY Left 09/19/2022   Procedure: Microdiscectomy - left - Lumbar Four-Lumbar Five;  Surgeon: Julio Sicks, MD;  Location: [facility] OR;  Service: Neurosurgery;  Laterality: Left;  3C   Patient Active Problem List   Diagnosis Date Noted   Recurrent herniation of lumbar disc 09/19/2022   CAD (coronary artery disease), native coronary artery 09/06/2020   HLD (hyperlipidemia) 09/06/2020   PAF (paroxysmal atrial fibrillation) (HCC) 06/13/2020   Essential hypertension 06/13/2020   Elevated troponin 06/13/2020   Parkinson's disease (HCC) 12/12/2012    ONSET DATE: 08/15/2023 (Date of referral); has been dx  with PD since 2007  REFERRING DIAG:  G20.B1 (ICD-10-CM) - Parkinson's disease with dyskinesia, without mention of fluctuations  G20.B2 (ICD-10-CM) - Parkinson's disease with dyskinesia, with fluctuations  K59.00 (ICD-10-CM) - Constipation, unspecified  R29.818 (ICD-10-CM) - Other symptoms and signs involving the nervous system  R29.898 (ICD-10-CM) - Other symptoms and signs involving the musculoskeletal system  I49.9 (ICD-10-CM) - Cardiac arrhythmia, unspecified  R29.3 (ICD-10-CM) - Abnormal posture   THERAPY DIAG:  Other lack of coordination  Abnormal posture  Other symptoms and signs involving the musculoskeletal system  Other symptoms and signs involving the nervous system  Unsteadiness on feet  Rationale for Evaluation and Treatment: Rehabilitation  SUBJECTIVE:   SUBJECTIVE STATEMENT: No new falls and less pain in neck today. Wife reports he has stayed active but has noticed small changes/difficulty (handwriting, buttons, using screwdriver)  Pt accompanied by: significant other - Karen  PERTINENT HISTORY: PMH - hypertension, HLD, PAF, CAD, s/p hernia repair in 2012, and s/p multiple back surgeries   PRECAUTIONS: Fall  WEIGHT BEARING RESTRICTIONS: No  PAIN:  Are you having pain? Yes: NPRS scale: 1-2/10 Pain location: neck Pain description: stiff; numb Aggravating factors: turning head Relieving factors: medications; CBD cream  FALLS: Has patient fallen in last 6 months? 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  MOBILITY STATUS: Independent - uses cane  POSTURE COMMENTS:  rounded shoulders and forward head  ACTIVITY TOLERANCE: Activity tolerance: good  FUNCTIONAL OUTCOME MEASURES: Fastening/unfastening 3 buttons: 43 seconds Physical performance test: PPT#2 (simulated eating) 12 seconds & PPT#4 (donning/doffing jacket): 10 seconds  COORDINATION: 9 Hole Peg test: Right: 31 sec; Left: 29 sec Box and Blocks:  Right 47 blocks, Left 50 blocks Tremors: Resting, action, Right, Left, and head  UE ROM:  WFL  UE MMT:   WFL  SENSATION: WFL  MUSCLE TONE: WFL  COGNITION: Overall cognitive status: Within functional limits for tasks assessed  OBSERVATIONS: Bradykinesia, Dyskinesias, and Postural tremors                                                                                                                   TREATMENT:   Pt issued PWR! Hands and reviewed extensively. See pt instructions for details.   Pt also issued handwriting strategies for PD and reviewed and practiced writing with increased legibility and size. Pt required cues to slow down, aim big, and focus on each letter. Pt also with motor planning deficits w/ letters  Therapist demo hooking/unhooking buttons and demo strategies for hooking/unhooking buttons including opening hands big before each button. Also discussed strategies for using screw driver and opening/closing twist lids w/ big turns.   Also discussed common symptoms of PD and staying ahead of PD, how to slow down progression. Discussed non motor symptoms that can also prevent years before PD diagnosis.    PATIENT EDUCATION: Education details: see above Person educated: Patient and Spouse Education method: Explanation, Demonstration,  Verbal cues, and Handouts Education comprehension: verbalized understanding, returned demonstration, verbal cues required, and needs further education  HOME EXERCISE PROGRAM: 08/27/23: PWR! Hands, handwriting strategies  GOALS:  SHORT TERM GOALS: Target date: 09/18/2023    Pt will be independent with PD specific HEP.  Baseline: not yet initiated Goal status: IN PROGRESS  2.  Pt will verbalize understanding of adapted strategies to maximize safety and independence with ADLs/IADLs.  Baseline: not yet initiated Goal status: IN PROGRESS  LONG TERM GOALS: Target date: 09/26/2023    Pt will verbalize understanding of ways to prevent future PD related complications and PD community resources.  Baseline: not yet initiated Goal status: INITIAL  2.  Pt will write a short paragraph with no significant decrease in size and maintain 100% legibility.  Baseline: 50% legibility with moderate micrographia Goal status: INITIAL  3.  Pt will verbalize understanding of ways to keep thinking skills  sharp and ways to compensate for STM changes in the future.  Baseline: not yet initiated Goal status: INITIAL  4.  Pt will demonstrate improved ease with fastening buttons as evidenced by decreasing 3 button/unbutton time to 35 seconds or less.  Baseline: 43 seconds Goal status: INITIAL  5.  Pt will be able to place at least 50 blocks using R hand with completion of Box and Blocks test.  Baseline: 47 blocks Goal status: INITIAL  ASSESSMENT:  CLINICAL IMPRESSION: Patient seen today for occupational therapy treatment for PD. Hx includes hypertension, HLD, PAF, CAD, s/p hernia repair in 2012, and s/p multiple back surgeries . Pt with greater understanding of PD symptoms and strategies to combat this. Patient currently presents below baseline level of functioning demonstrating functional deficits and impairments as noted below. Pt would benefit from continued skilled OT services in the outpatient  setting to work on impairments as noted below to help pt return to PLOF as able.    PERFORMANCE DEFICITS: in functional skills including ADLs, IADLs, coordination, Fine motor control, Gross motor control, and UE functional use.   IMPAIRMENTS: are limiting patient from ADLs, IADLs, and leisure.   COMORBIDITIES:  may have co-morbidities  that affects occupational performance. Patient will benefit from skilled OT to address above impairments and improve overall function.  MODIFICATION OR ASSISTANCE TO COMPLETE EVALUATION: Min-Moderate modification of tasks or assist with assess necessary to complete an evaluation.  OT OCCUPATIONAL PROFILE AND HISTORY: Detailed assessment: Review of records and additional review of physical, cognitive, psychosocial history related to current functional performance.  CLINICAL DECISION MAKING: Moderate - several treatment options, min-mod task modification necessary  REHAB POTENTIAL: Fair given PD prognosis  EVALUATION COMPLEXITY: Moderate    PLAN:  OT FREQUENCY: 2x/week  OT DURATION: 4 weeks (extended to 9 total visits due to scheduling issues)  PLANNED INTERVENTIONS: 40981 OT Re-evaluation, 97535 self care/ADL training, 19147 therapeutic exercise, 97530 therapeutic activity, 97112 neuromuscular re-education, functional mobility training, and patient/family education  RECOMMENDED OTHER SERVICES: Recommend ST referral for evaluation and treatment of cognition and speech.   CONSULTED AND AGREED WITH PLAN OF CARE: Patient and family member/caregiver  PLAN FOR NEXT SESSION: coordination HEP, ADL strategies (practice jacket, buttons, cutting food, opening twist jars)    Sheran Lawless, OT 08/27/2023, 9:33 AM

## 2023-08-27 NOTE — Patient Instructions (Addendum)
 PWR! Hand Exercises Perform each exercise at least 10 repetitions 1x/day, and PWR! PUSH throughout the day when you are having trouble using your hands (picking up small objects, writing, eating, typing, buttoning, etc.). ** Make each movement big and deliberate; feel the movement.  PWR! UP: Fists to open fingers BIG  PWR! Rock:  Move wrists up and down [employer]! Twist: Twist palms up and down BIG  PWR! Step: Step your thumb to index finger while keeping other fingers straight. Flick fingers out BIG (thumb out/straighten fingers). Repeat with other fingers.  PWR! PUSH: Push hands out BIG. Elbows straight, wrists up, fingers open and spread apart BIG.        Suggestions for Handwriting Changes Many people with Parkinson's notice changes in their handwriting.  Handwriting often becomes small and cramped, and can become more difficult to control when writing for longer periods of time.  This handwriting change is called micrographia.  Why does micrographia occur?  Parkinson's can cause slowing of movement, and feelings of muscle stiffness in the hands and fingers.  Loss of automatic motion also affects the easy, flowing motion of handwriting.  This can impact even simple writing tasks such as signing your name or writing a shopping list.  Attempts to write quickly without thinking about forming each letter contributes to small, cramped handwriting, and may cause the hand to develop a feeling of tightness.  How can I make writing easier?  Make a deliberate effort to form each letter.  This can be hard to do at first, but is very effective in improving size and legibility of handwriting.  Use a pen grip (round or triangular shaped rubber or foam cylinders available at stationery stores or where writing materials are found) or a larger size pen to keep your hand more relaxed.  Try printing rather than writing in a cursive style. Printing causes you to pause briefly between each letter, keeping  writing more legible.   Using lined paper may provide a "visual target" to keep all letters big when writing.   A ballpoint pen typically works better than felt tip or "rolling writer"/gel styles.  Rest your hand if it begins to feel "tight".  Pause briefly when you see your handwriting becoming smaller.  Avoid hurrying or trying to write long passages if you are feeling stressed or fatigued.  Practice helps!  Remind yourself to slow down, aim big, and pause often!  Perform "flicks"/PWR! Hands if your hand feels tight, your writing gets smaller, before you start writing, or if tremors increase.  Involving your team: An occupational therapist can provide assessment and individual recommendations for improvement of your handwriting.  This handout was adapted from [email or web address] [employer]

## 2023-08-29 ENCOUNTER — Encounter: Payer: Self-pay | Admitting: Physical Therapy

## 2023-08-29 ENCOUNTER — Ambulatory Visit: Admitting: Physical Therapy

## 2023-08-29 VITALS — BP 125/59 | HR 56

## 2023-08-29 DIAGNOSIS — R29818 Other symptoms and signs involving the nervous system: Secondary | ICD-10-CM

## 2023-08-29 DIAGNOSIS — R2689 Other abnormalities of gait and mobility: Secondary | ICD-10-CM | POA: Diagnosis not present

## 2023-08-29 DIAGNOSIS — R471 Dysarthria and anarthria: Secondary | ICD-10-CM | POA: Diagnosis not present

## 2023-08-29 DIAGNOSIS — R1310 Dysphagia, unspecified: Secondary | ICD-10-CM | POA: Diagnosis not present

## 2023-08-29 DIAGNOSIS — R293 Abnormal posture: Secondary | ICD-10-CM

## 2023-08-29 DIAGNOSIS — R29898 Other symptoms and signs involving the musculoskeletal system: Secondary | ICD-10-CM | POA: Diagnosis not present

## 2023-08-29 DIAGNOSIS — G20B2 Parkinson's disease with dyskinesia, with fluctuations: Secondary | ICD-10-CM | POA: Diagnosis not present

## 2023-08-29 DIAGNOSIS — R2681 Unsteadiness on feet: Secondary | ICD-10-CM

## 2023-08-29 DIAGNOSIS — M5459 Other low back pain: Secondary | ICD-10-CM | POA: Diagnosis not present

## 2023-08-29 DIAGNOSIS — R278 Other lack of coordination: Secondary | ICD-10-CM | POA: Diagnosis not present

## 2023-08-29 NOTE — Patient Instructions (Signed)
 Tips to reduce freezing episodes with standing or walking:  Don't try to fight the freeze: if you begin taking slower, faster, smaller steps, STOP, get your posture tall, and RESET your posture and balance.  Take a deep breath before taking the BIG step to start again. Stand tall with your feet wide, so that you can rock and weight shift through your hips. March in place, with high knee stepping, to get started walking again. Use auditory cues:  Count out loud, think of a familiar tune or song or cadence, use pocket metronome, to use rhythm to get started walking again. Use visual cues:  Use a line to step over, use laser pointer line to step over, (using BIG steps) to start walking again. Use visual targets to keep your posture tall (look ahead and focus on an object or target at eye level). As you approach where your destination with walking, count your steps out loud and/or focus on your target with your eyes until you are fully there. Use appropriate assistive device, as advised by your physical therapist to assist with taking longer, consistent steps.   Ways to help with turning when walking: Think about making wider 'U' turns when walking and taking nice BIG steps!  When having to take more narrow turns (such as when going to sit down in a chair) - think about picking up feet big from the floor!

## 2023-08-29 NOTE — Therapy (Signed)
 OUTPATIENT PHYSICAL THERAPY NEURO TREATMENT   Patient Name: DUANE EARNSHAW MRN: [id number] DOB:07-04-1950, 73 y.o., male Today's Date: 08/29/2023   PCP: Lonie Peak, PA-C REFERRING PROVIDER: Huston Foley, MD  END OF SESSION:  PT End of Session - 08/29/23 0803     Visit Number 3    Number of Visits 13    Date for PT Re-Evaluation 10/16/23    Authorization Type [employer]    PT Start Time 0801    PT Stop Time 0843    PT Time Calculation (min) 42 min    Equipment Utilized During Treatment Gait belt    Activity Tolerance Patient tolerated treatment well    Behavior During Therapy WFL for tasks assessed/performed             Past Medical History:  Diagnosis Date   CAD (coronary artery disease), native coronary artery 09/06/2020   Coronary CTA was done showing a coronary calcium score of 201 placing him at the 60th percentile for age and gender.  He was noted to have 75 to 99% stenosis of the mid LAD with scattered nonobstructive plaquing elsewhere.  FFR 0.7 in D1 and 0.73 at the apex>>medical management   HLD (hyperlipidemia) 09/06/2020   Hypertension    Parkinson disease [facility])    Past Surgical History:  Procedure Laterality Date   BACK SURGERY     2019.2023, january 2024 lumbar surgeries with dr. pool   HERNIA REPAIR     ventral hernia repair with mesh x2   LUMBAR LAMINECTOMY/DECOMPRESSION MICRODISCECTOMY Left 09/19/2022   Procedure: Microdiscectomy - left - Lumbar Four-Lumbar Five;  Surgeon: Julio Sicks, MD;  Location: [facility] OR;  Service: Neurosurgery;  Laterality: Left;  3C   Patient Active Problem List   Diagnosis Date Noted   Recurrent herniation of lumbar disc 09/19/2022   CAD (coronary artery disease), native coronary artery 09/06/2020   HLD (hyperlipidemia) 09/06/2020   PAF (paroxysmal atrial fibrillation) (HCC) 06/13/2020   Essential hypertension 06/13/2020   Elevated troponin 06/13/2020   Parkinson's disease (HCC) 12/12/2012    ONSET DATE: 08/15/2023  (referral)  REFERRING DIAG: G20.B2 (ICD-10-CM) - Parkinson's disease with dyskinesia and fluctuating manifestations (HCC) G20.B1 (ICD-10-CM) - 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 Tried the exercises yesterday. No falls.   Pt accompanied by: self and   Wife   PERTINENT HISTORY: hypertension, HLD, PAF, CAD, s/p hernia repair in 2012, and s/p multiple back surgeries. Diagnosed w/PD in 2007   PAIN:  Are you having pain?  No, just feeling sore form therapy the other day and working on his tractor   PRECAUTIONS: Fall  RED FLAGS: None   WEIGHT BEARING RESTRICTIONS: No  FALLS: Has patient fallen in last 6 months?  No, but several near misses   LIVING ENVIRONMENT: Lives with: lives with their spouse Lives in: House/apartment Stairs: Yes: Internal: 15 steps; on right going up and External: 15 steps; on right going up, on left going up, and can reach both Has following equipment at home: Single point cane and Walker - 2 wheeled  PLOF: Independent  PATIENT GOALS: " Stabilize"   OBJECTIVE:  Note: Objective measures were completed at Evaluation unless otherwise noted.  DIAGNOSTIC FINDINGS: MRI of Lumbar spine from 08/2022  IMPRESSION: 1. New prominent marrow edema in the left sacral ala, suspicious for insufficiency  fracture. 2. New left subarticular disc extrusion at L4-L5 potentially affecting the descending left L5 nerve root. 3. Interval posterior decompression at L5-S1 with improved now mild left lateral recess stenosis at L5-S1. 4. Unchanged severe bilateral neuroforaminal stenosis at L4-L5 and L5-S1.  COGNITION: Overall cognitive status: Within functional limits for tasks assessed   SENSATION: Pt reports frequent numbness of L foot, thinks due to history of low back surgeries.   COORDINATION: Heel to shin test: WNL bilaterally    POSTURE: rounded shoulders, forward head, and difficult to assess due to dyskinesias   LOWER EXTREMITY ROM:     Active  Right Eval Left Eval  Hip flexion    Hip extension    Hip abduction    Hip adduction    Hip internal rotation    Hip external rotation    Knee flexion    Knee extension    Ankle dorsiflexion    Ankle plantarflexion    Ankle inversion    Ankle eversion     (Blank rows = not tested)  LOWER EXTREMITY MMT:  Tested in seated position   MMT Right Eval Left Eval  Hip flexion 5 5  Hip extension    Hip abduction 5 5  Hip adduction 5 5  Hip internal rotation    Hip external rotation    Knee flexion 5 5  Knee extension 5 5  Ankle dorsiflexion 5 5  Ankle plantarflexion    Ankle inversion    Ankle eversion    (Blank rows = not tested)  BED MOBILITY:  Independent per pt   TRANSFERS: Assistive device utilized: None  Sit to stand: Modified independence Stand to sit: Modified independence Chair to chair: Modified independence   GAIT: Gait pattern: step through pattern, decreased stride length, shuffling, festinating, lateral hip instability, trunk flexed, and wide BOS Distance walked: Various clinic distances  Assistive device utilized: Single point cane and None Level of assistance: SBA and CGA Comments: Significant freezing w/turns and walking into doorways. Pt w/forward flexed posture and fighting his freeze, requiring  CGA to avoid anterior LOB   PATIENT SURVEYS:  Freezing of gait questionnaire 14/24  TREATMENT     Therapeutic Activity:   Vitals:   08/29/23 0806  BP: (!) 125/59  Pulse: (!) 56    Vitals assessed, WFL for therapy Discussed freezing strategies due to pt having significant freezing with turns and into doorways  Educated on stopping, standing with tall posture, rocking/shifting weight and then taking a big step to get moving again. Also educated that when pt going through doorway, can also use a visual target in the distance to work on tall posture. Worked on walking through doorways in clinic with pt having no freezing episodes, pt reports he freezes at home as doorways are more narrow. Practiced 3 reps of freezing strategies before going through a doorway  Practiced turning strategies during session with no AD as it is another trigger for pt's freezing Worked on wider 'U' turns during gait and taking larger steps, performed as a TUG 4 reps with wider based turns with pt having no freezing episodes with this, also educated on marching turns (when turning more narrow) when going to sit back in chair, performed 4 reps total. Pt demonstrated good carry over from education with no freezing episodes during session  Pt reports feeling unstable and moving fast when going down inclines in his yard when he does not have his AD, educated to make sure that he uses his walking stick/cane when outside on unlevel surfaces (pt reports this does make him feel more steady)  Provided handout with freezing and turning strategies for home.   Pt ambulates into and out of clinic with walking stick/SPC with mod I, performed gait during session with no AD with supervision   Therapeutic Exercise:  SciFit with BUE/BLE Multi-Peaks at Gear 4.0 for 8 minutes for reciprocal movement patterns, incr  amplitude of stepping, neural priming. Cues for R knee ADD as it tends to ABDduct. Pt reporting RPE as 5/10 afterwards   NMR:  Pt performs PWR! Moves in standing position with chair in front 10 reps of each mirroring therapist to maximize amplitude of movements   PWR! Up for improved posture  PWR! Rock for improved weighshifting  PWR! Twist for improved trunk rotation   PWR! Step for improved step initiation - cues for incr foot clearance esp with RLE   Cues for looking at hands when reaching. Pt reporting RPE as 6-7/10 afterwards    PATIENT EDUCATION: Education details: Freezing and turning strategies, see therapeutic activity section above  Person educated: Patient and Spouse Education method: Medical illustrator Education comprehension: verbalized understanding  HOME EXERCISE PROGRAM: Standing PWR! Moves with chair in front 2 x 10 reps 3x week   GOALS: Goals reviewed with patient? Yes  SHORT TERM GOALS: Target date: 09/18/2023   Pt will be independent with initial HEP for improved strength, balance, transfers and gait.  Baseline: not established on eval  Goal status: INITIAL  2.  Pt will verbalize and demonstrate freezing strategies to implement at home and in community for improved mobility and reduced fall risk  Baseline:  Goal status: INITIAL  3.  MiniBest to be assessed and LTG updated  Baseline: assessed on 4/7 Goal status: MET   LONG TERM GOALS: Target date: 10/02/2023   Patient will improve miniBEST score to 20/28 to indicate progress towards a decreased risk of falls and improved dynamic and static stability.   Baseline: 16/28 Goal status: INITIAL  2.  Pt will score </= 10/24 on FOGQ for improved freezing strategies and reduced fall risk  Baseline:  Goal status: INITIAL  ASSESSMENT:  CLINICAL IMPRESSION: Reviewed standing PWR moves from previous session with pt performing well with larger amplitude movements. Pt is challenged with RLE  foot clearance back to midline with PWR Step. Also focused on freezing strategies with focus on stopping, resetting with tall posture, and weight shifting before taking a big step. Worked on turning strategies with wider based U turns during gait and marching turns for more narrow spaces. Pt demonstrated understanding during session and pt having no freezing episodes during session even with no AD. Will continue per POC.   OBJECTIVE IMPAIRMENTS: Abnormal gait, decreased balance, decreased knowledge of use of DME, decreased mobility, difficulty walking, impaired sensation, postural dysfunction, pain, and dyskinesias     ACTIVITY LIMITATIONS: carrying, lifting, bending, standing, squatting, transfers, locomotion level, and caring for others  PARTICIPATION LIMITATIONS: medication management, driving, shopping, community activity, and yard work  PERSONAL FACTORS: Fitness, Past/current experiences, and 1 comorbidity: PD w/dyskinesias  are also affecting patient's functional outcome.   REHAB POTENTIAL: Good  CLINICAL DECISION MAKING: Evolving/moderate complexity  EVALUATION COMPLEXITY: Moderate  PLAN:  PT FREQUENCY: 2x/week  PT DURATION: 6 weeks  PLANNED INTERVENTIONS: 97164- PT Re-evaluation, 97110-Therapeutic exercises, 97530- Therapeutic activity, [id number]- Neuromuscular re-education, 97535- Self Care, 16109- Manual therapy, [phone]- Gait training, [phone]- Aquatic Therapy, Patient/Family education, Balance training, Dry Needling, Vestibular training, and DME instructions  PLAN FOR NEXT SESSION: review freezing strategies and turning, AD recommendations as appropriate (uses cane during day sometimes and rollator sometimes at night and no AD other times), review PWR! Moves as needed, stepping strategy (all directions were challenging on miniBEST), work on balance with turns   Drake Leach, PT, DPT 08/29/2023, 12:10 PM

## 2023-09-03 ENCOUNTER — Ambulatory Visit: Admitting: Physical Therapy

## 2023-09-03 VITALS — BP 131/67 | HR 74

## 2023-09-03 DIAGNOSIS — R278 Other lack of coordination: Secondary | ICD-10-CM | POA: Diagnosis not present

## 2023-09-03 DIAGNOSIS — R293 Abnormal posture: Secondary | ICD-10-CM | POA: Diagnosis not present

## 2023-09-03 DIAGNOSIS — R471 Dysarthria and anarthria: Secondary | ICD-10-CM | POA: Diagnosis not present

## 2023-09-03 DIAGNOSIS — G20B2 Parkinson's disease with dyskinesia, with fluctuations: Secondary | ICD-10-CM

## 2023-09-03 DIAGNOSIS — R29818 Other symptoms and signs involving the nervous system: Secondary | ICD-10-CM | POA: Diagnosis not present

## 2023-09-03 DIAGNOSIS — M5459 Other low back pain: Secondary | ICD-10-CM | POA: Diagnosis not present

## 2023-09-03 DIAGNOSIS — R2681 Unsteadiness on feet: Secondary | ICD-10-CM | POA: Diagnosis not present

## 2023-09-03 DIAGNOSIS — R29898 Other symptoms and signs involving the musculoskeletal system: Secondary | ICD-10-CM | POA: Diagnosis not present

## 2023-09-03 DIAGNOSIS — R1310 Dysphagia, unspecified: Secondary | ICD-10-CM | POA: Diagnosis not present

## 2023-09-03 DIAGNOSIS — R2689 Other abnormalities of gait and mobility: Secondary | ICD-10-CM

## 2023-09-03 NOTE — Therapy (Signed)
 OUTPATIENT PHYSICAL THERAPY NEURO TREATMENT   Patient Name: Jack Cobb MRN: [id number] DOB:August 04, 1950, 73 y.o., male Today's Date: 09/03/2023   PCP: Aloha Arnold, PA-C REFERRING PROVIDER: Debbra Fairy, MD  END OF SESSION:  PT End of Session - 09/03/23 0802     Visit Number 4    Number of Visits 13    Date for PT Re-Evaluation 10/16/23    Authorization Type [employer] Medicare    PT Start Time 0801    PT Stop Time 0843    PT Time Calculation (min) 42 min    Equipment Utilized During Treatment Gait belt    Activity Tolerance Patient tolerated treatment well    Behavior During Therapy WFL for tasks assessed/performed              Past Medical History:  Diagnosis Date   CAD (coronary artery disease), native coronary artery 09/06/2020   Coronary CTA was done showing a coronary calcium score of 201 placing him at the 60th percentile for age and gender.  He was noted to have 75 to 99% stenosis of the mid LAD with scattered nonobstructive plaquing elsewhere.  FFR 0.7 in D1 and 0.73 at the apex>>medical management   HLD (hyperlipidemia) 09/06/2020   Hypertension    Parkinson disease [facility])    Past Surgical History:  Procedure Laterality Date   BACK SURGERY     2019.2023, january 2024 lumbar surgeries with dr. pool   HERNIA REPAIR     ventral hernia repair with mesh x2   LUMBAR LAMINECTOMY/DECOMPRESSION MICRODISCECTOMY Left 09/19/2022   Procedure: Microdiscectomy - left - Lumbar Four-Lumbar Five;  Surgeon: Agustina Aldrich, MD;  Location: [facility] OR;  Service: Neurosurgery;  Laterality: Left;  3C   Patient Active Problem List   Diagnosis Date Noted   Recurrent herniation of lumbar disc 09/19/2022   CAD (coronary artery disease), native coronary artery 09/06/2020   HLD (hyperlipidemia) 09/06/2020   PAF (paroxysmal atrial fibrillation) (HCC) 06/13/2020   Essential hypertension 06/13/2020   Elevated troponin 06/13/2020   Parkinson's disease (HCC) 12/12/2012    ONSET DATE: 08/15/2023  (referral)  REFERRING DIAG: G20.B2 (ICD-10-CM) - Parkinson's disease with dyskinesia and fluctuating manifestations (HCC) G20.B1 (ICD-10-CM) - 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 Had several near falls but no true falls. Denies pain today.   Pt accompanied by: self and   Wife   PERTINENT HISTORY: hypertension, HLD, PAF, CAD, s/p hernia repair in 2012, and s/p multiple back surgeries. Diagnosed w/PD in 2007   PAIN:  Are you having pain?  No, just feeling sore form therapy the other day and working on his tractor   PRECAUTIONS: Fall  RED FLAGS: None   WEIGHT BEARING RESTRICTIONS: No  FALLS: Has patient fallen in last 6 months?  No, but several near misses   LIVING ENVIRONMENT: Lives with: lives with their spouse Lives in: House/apartment Stairs: Yes: Internal: 15 steps; on right going up and External: 15 steps; on right going up, on left going up, and can reach both Has following equipment at home: Single point cane and Walker - 2 wheeled  PLOF: Independent  PATIENT GOALS: " Stabilize"   OBJECTIVE:  Note: Objective measures were completed at Evaluation unless otherwise noted.  DIAGNOSTIC FINDINGS: MRI of Lumbar spine from 08/2022  IMPRESSION: 1. New prominent marrow edema in the left sacral ala,  suspicious for insufficiency fracture. 2. New left subarticular disc extrusion at L4-L5 potentially affecting the descending left L5 nerve root. 3. Interval posterior decompression at L5-S1 with improved now mild left lateral recess stenosis at L5-S1. 4. Unchanged severe bilateral neuroforaminal stenosis at L4-L5 and L5-S1.  COGNITION: Overall cognitive status: Within functional limits for tasks assessed   SENSATION: Pt reports frequent numbness of L foot, thinks due to history of low back surgeries.   COORDINATION: Heel to shin test: WNL bilaterally    POSTURE: rounded shoulders, forward head, and difficult to assess due to dyskinesias   LOWER EXTREMITY ROM:     Active  Right Eval Left Eval  Hip flexion    Hip extension    Hip abduction    Hip adduction    Hip internal rotation    Hip external rotation    Knee flexion    Knee extension    Ankle dorsiflexion    Ankle plantarflexion    Ankle inversion    Ankle eversion     (Blank rows = not tested)  LOWER EXTREMITY MMT:  Tested in seated position   MMT Right Eval Left Eval  Hip flexion 5 5  Hip extension    Hip abduction 5 5  Hip adduction 5 5  Hip internal rotation    Hip external rotation    Knee flexion 5 5  Knee extension 5 5  Ankle dorsiflexion 5 5  Ankle plantarflexion    Ankle inversion    Ankle eversion    (Blank rows = not tested)  BED MOBILITY:  Independent per pt   TRANSFERS: Assistive device utilized: None  Sit to stand: Modified independence Stand to sit: Modified independence Chair to chair: Modified independence   GAIT: Gait pattern: step through pattern, decreased stride length, shuffling, festinating, lateral hip instability, trunk flexed, and wide BOS Distance walked: Various clinic distances  Assistive device utilized: Single point cane and None Level of assistance: SBA and CGA Comments: Significant freezing w/turns and walking into doorways. Pt w/forward flexed posture and  fighting his freeze, requiring CGA to avoid anterior LOB   PATIENT SURVEYS:  Freezing of gait questionnaire 14/24  VITALS  Vitals:   09/03/23 0821 09/03/23 0822  BP: 131/66 131/67  Pulse: 77 74  TREATMENT     Self-care Encouraged pt and wife to reach out to Dr. Omar Bibber regarding medication management, as pt's dyskinesias have been worse recently following medication change. Pt's wife reports they have appointment w/movement disorder specialist in May. Encouraged pt to keep this appointment but still contact Dr. Omar Bibber rather than waiting until May as pt has had multiple near misses.  Wife inquiring about pt wearing back brace to facilitate upright posture. Discouraged use of this as pt will be more successful if using his own musculature for postural control unless he is having severe pain. Wife and pt verbalized understanding.  Assessed vitals in seated and standing position (see above) and WNL w/no orthostatics noted.   NMR  The following were performed for improved postural control on declines, eccentric control w/anterior weight shift and stability w/high amplitude movement: From mat height, sit to stands w/heels elevated on blue balance beam, x10 reps. Pt initially performing w/BUE support, so cued for no UE support x8 reps. Pt required CGA-min A for stability due to significant anterior instability and poor hip extension. Mod cues for glute activation to facilitate hip extension at top of stand and pt able to stabilize more independently.  Progressed to 10 reps w/10# slam ball throw for added instability challenge. Cued pt to use momentum of hip extension to throw ball, which was more difficult for pt. CGA-min A for safety.  Educated pt on importance of proper body mechanics w/transfers and facilitating hip extension to reduce festination and anterior instability,  pt verbalized understanding.   RAMP:  Level of Assistance: SBA Assistive device utilized: None Ramp Comments: Practiced ascending/descending ramp x6 reps for improved postural control and freezing management w/turns. Min cues for upright posture throughout and reminders to stop and rest w/freezing. Pt able to manage posture well but demonstrated moderate freezing w/turns and frequently fighting his freeze rather that stopping and resetting. Pt able to teach back freezing strategies but reports difficulty replicating this at home.     PATIENT EDUCATION: Education details: Freezing and turning strategies, see self-care section above  Person educated: Patient and Spouse Education method: Medical illustrator Education comprehension: verbalized understanding and needs further education  HOME EXERCISE PROGRAM: Standing PWR! Moves with chair in front 2 x 10 reps 3x week   GOALS: Goals reviewed with patient? Yes  SHORT TERM GOALS: Target date: 09/18/2023   Pt will be independent with initial HEP for improved strength, balance, transfers and gait.  Baseline: not established on eval  Goal status: INITIAL  2.  Pt will verbalize and demonstrate freezing strategies to implement at home and in community for improved mobility and reduced fall risk  Baseline:  Goal status: INITIAL  3.  MiniBest to be assessed and LTG updated  Baseline: assessed on 4/7 Goal status: MET   LONG TERM GOALS: Target date: 10/02/2023   Patient will improve miniBEST score to 20/28 to indicate progress towards a decreased risk of falls and improved dynamic and static stability.   Baseline: 16/28 Goal status: INITIAL  2.  Pt will score </= 10/24 on FOGQ for improved freezing strategies and reduced fall risk  Baseline:  Goal status: INITIAL   ASSESSMENT:  CLINICAL IMPRESSION: Emphasis of skilled PT session on pt education regarding medication management, improved stability w/anterior weight shift  and postural control. Pt reports having a rough weekend due to dyskinesias so encouraged pt to reach out to neurologist as this appears to have worsened since recent medication change. Pt required min cues to  remember freezing strategies w/turns this date, as he tends to fight his freeze and festinate. Pt challenged w/eccentric control w/anterior weight shift but did improve w/cues to facilitate hip extension. Will continue per POC.   OBJECTIVE IMPAIRMENTS: Abnormal gait, decreased balance, decreased knowledge of use of DME, decreased mobility, difficulty walking, impaired sensation, postural dysfunction, pain, and dyskinesias     ACTIVITY LIMITATIONS: carrying, lifting, bending, standing, squatting, transfers, locomotion level, and caring for others  PARTICIPATION LIMITATIONS: medication management, driving, shopping, community activity, and yard work  PERSONAL FACTORS: Fitness, Past/current experiences, and 1 comorbidity: PD w/dyskinesias  are also affecting patient's functional outcome.   REHAB POTENTIAL: Good  CLINICAL DECISION MAKING: Evolving/moderate complexity  EVALUATION COMPLEXITY: Moderate  PLAN:  PT FREQUENCY: 2x/week  PT DURATION: 6 weeks  PLANNED INTERVENTIONS: 97164- PT Re-evaluation, 97110-Therapeutic exercises, 97530- Therapeutic activity, [id number]- Neuromuscular re-education, 97535- Self Care, 16109- Manual therapy, [phone]- Gait training, [phone]- Aquatic Therapy, Patient/Family education, Balance training, Dry Needling, Vestibular training, and DME instructions  PLAN FOR NEXT SESSION: review freezing strategies and turning, AD recommendations as appropriate (uses cane during day sometimes and rollator sometimes at night and no AD other times), review PWR! Moves as needed, stepping strategy (all directions were challenging on miniBEST), work on balance with turns. Did they reach out to Dr. Omar Bibber?   Krystofer Hevener E Estel Scholze, PT, DPT 09/03/2023, 8:44 AM

## 2023-09-05 ENCOUNTER — Ambulatory Visit: Admitting: Physical Therapy

## 2023-09-05 ENCOUNTER — Encounter: Payer: Self-pay | Admitting: Physical Therapy

## 2023-09-05 VITALS — BP 101/56 | HR 73

## 2023-09-05 DIAGNOSIS — G20B2 Parkinson's disease with dyskinesia, with fluctuations: Secondary | ICD-10-CM | POA: Diagnosis not present

## 2023-09-05 DIAGNOSIS — R29898 Other symptoms and signs involving the musculoskeletal system: Secondary | ICD-10-CM | POA: Diagnosis not present

## 2023-09-05 DIAGNOSIS — R2689 Other abnormalities of gait and mobility: Secondary | ICD-10-CM | POA: Diagnosis not present

## 2023-09-05 DIAGNOSIS — M5459 Other low back pain: Secondary | ICD-10-CM | POA: Diagnosis not present

## 2023-09-05 DIAGNOSIS — R2681 Unsteadiness on feet: Secondary | ICD-10-CM | POA: Diagnosis not present

## 2023-09-05 DIAGNOSIS — R1310 Dysphagia, unspecified: Secondary | ICD-10-CM | POA: Diagnosis not present

## 2023-09-05 DIAGNOSIS — R278 Other lack of coordination: Secondary | ICD-10-CM | POA: Diagnosis not present

## 2023-09-05 DIAGNOSIS — R293 Abnormal posture: Secondary | ICD-10-CM

## 2023-09-05 DIAGNOSIS — R29818 Other symptoms and signs involving the nervous system: Secondary | ICD-10-CM | POA: Diagnosis not present

## 2023-09-05 DIAGNOSIS — R471 Dysarthria and anarthria: Secondary | ICD-10-CM | POA: Diagnosis not present

## 2023-09-05 NOTE — Therapy (Signed)
 OUTPATIENT PHYSICAL THERAPY NEURO TREATMENT   Patient Name: Jack Cobb MRN: [id number] DOB:13-Oct-1950, 73 y.o., male Today's Date: 09/05/2023   PCP: Aloha Arnold, PA-C REFERRING PROVIDER: Debbra Fairy, MD  END OF SESSION:  PT End of Session - 09/05/23 0803     Visit Number 5    Number of Visits 13    Date for PT Re-Evaluation 10/16/23    Authorization Type [employer] Medicare    PT Start Time 0802    PT Stop Time 0843    PT Time Calculation (min) 41 min    Equipment Utilized During Treatment Gait belt    Activity Tolerance Patient tolerated treatment well    Behavior During Therapy WFL for tasks assessed/performed              Past Medical History:  Diagnosis Date   CAD (coronary artery disease), native coronary artery 09/06/2020   Coronary CTA was done showing a coronary calcium score of 201 placing him at the 60th percentile for age and gender.  He was noted to have 75 to 99% stenosis of the mid LAD with scattered nonobstructive plaquing elsewhere.  FFR 0.7 in D1 and 0.73 at the apex>>medical management   HLD (hyperlipidemia) 09/06/2020   Hypertension    Parkinson disease [facility])    Past Surgical History:  Procedure Laterality Date   BACK SURGERY     2019.2023, january 2024 lumbar surgeries with dr. pool   HERNIA REPAIR     ventral hernia repair with mesh x2   LUMBAR LAMINECTOMY/DECOMPRESSION MICRODISCECTOMY Left 09/19/2022   Procedure: Microdiscectomy - left - Lumbar Four-Lumbar Five;  Surgeon: Agustina Aldrich, MD;  Location: [facility] OR;  Service: Neurosurgery;  Laterality: Left;  3C   Patient Active Problem List   Diagnosis Date Noted   Recurrent herniation of lumbar disc 09/19/2022   CAD (coronary artery disease), native coronary artery 09/06/2020   HLD (hyperlipidemia) 09/06/2020   PAF (paroxysmal atrial fibrillation) (HCC) 06/13/2020   Essential hypertension 06/13/2020   Elevated troponin 06/13/2020   Parkinson's disease (HCC) 12/12/2012    ONSET DATE: 08/15/2023  (referral)  REFERRING DIAG: G20.B2 (ICD-10-CM) - Parkinson's disease with dyskinesia and fluctuating manifestations (HCC) G20.B1 (ICD-10-CM) - 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 Was using his walker in the house and his walking stick outside. Denies lightheadedness. No falls, had some close ones. Taking the Mirapex 3 times a day now (previously was 2 times a day). Planning on telling Dr. Omar Bibber this. Reports feeling pretty good this morning.   Pt accompanied by: self and   Wife   PERTINENT HISTORY: hypertension, HLD, PAF, CAD, s/p hernia repair in 2012, and s/p multiple back surgeries. Diagnosed w/PD in 2007   PAIN:  Are you having pain?  No, just feeling sore form therapy the other day and working on his tractor   PRECAUTIONS: Fall  RED FLAGS: None   WEIGHT BEARING RESTRICTIONS: No  FALLS: Has patient fallen in last 6 months?  No, but several near misses   LIVING ENVIRONMENT: Lives with: lives with their spouse Lives in: House/apartment Stairs: Yes: Internal: 15 steps; on right going up and External: 15 steps; on right going up, on left going up, and can reach both Has following equipment at home: Single point cane and Walker - 2 wheeled  PLOF: Independent  PATIENT GOALS: " Stabilize"   OBJECTIVE:   Note: Objective measures were completed at Evaluation unless otherwise noted.  DIAGNOSTIC FINDINGS: MRI of Lumbar spine from 08/2022  IMPRESSION: 1. New prominent marrow edema in the left sacral ala, suspicious for insufficiency fracture. 2. New left subarticular disc extrusion at L4-L5 potentially affecting the descending left L5 nerve root. 3. Interval posterior decompression at L5-S1 with improved now mild left lateral recess stenosis at L5-S1. 4. Unchanged severe bilateral neuroforaminal stenosis at L4-L5 and L5-S1.  COGNITION: Overall cognitive status: Within functional limits for tasks assessed   SENSATION: Pt reports frequent numbness of L foot, thinks due to history of low back surgeries.   COORDINATION: Heel to shin test: WNL bilaterally    POSTURE: rounded shoulders, forward head, and difficult to assess due to dyskinesias   LOWER EXTREMITY ROM:     Active  Right Eval Left Eval  Hip flexion    Hip extension    Hip abduction    Hip adduction    Hip internal rotation    Hip external rotation    Knee flexion    Knee extension    Ankle dorsiflexion    Ankle plantarflexion    Ankle inversion    Ankle eversion     (Blank rows = not tested)  LOWER EXTREMITY MMT:  Tested in seated position   MMT Right Eval Left Eval  Hip flexion 5 5  Hip extension    Hip abduction 5 5  Hip adduction 5 5  Hip internal rotation    Hip external rotation    Knee flexion 5 5  Knee extension 5 5  Ankle dorsiflexion 5 5  Ankle plantarflexion    Ankle inversion    Ankle eversion    (Blank rows = not tested)  BED MOBILITY:  Independent per pt   TRANSFERS: Assistive device utilized: None  Sit to stand: Modified independence Stand to sit: Modified independence Chair to chair: Modified independence   GAIT: Gait pattern: step through pattern, decreased stride length, shuffling, festinating, lateral hip instability, trunk flexed, and wide BOS Distance walked: Various  clinic distances  Assistive device utilized: Single point cane and None Level of assistance: SBA and CGA Comments: Significant freezing w/turns and walking into doorways. Pt w/forward flexed posture and fighting his freeze, requiring CGA to avoid anterior LOB   PATIENT  SURVEYS:  Freezing of gait questionnaire 14/24                                                                                                                                TREATMENT     Self-care  Vitals:   09/05/23 0808 09/05/23 0809  BP: (!) 111/53 (!) 101/56  Pulse: 72 73   Sitting, Standing   Continued to encourage pt and wife to reach out to Dr. Omar Bibber regarding medication management, as pt's dyskinesias have been worse recently following medication change. Pt reports that he increased his Miraprex back to 3 times a day (instead of 2 times a day) how he was taking it before. Educated to make sure he lets his neurologist aware of this. At end of session, pt's spouse also reports that pt's symptoms have been worse since his back surgeries Assessed vitals in seated and standing position (see above) pt with lower BP, but no orthostatics noted, pt not reporting any lightheadedness  Reviewed freezing episodes with gait - making sure to stop and reset and then shift weight/rock before taking a big step to continue again, pt reporting that it is a lot to think about   Therapeutic Exercise:  SciFit with BUE/BLE Multi-Peaks at Gear 4.0 for 8 minutes for reciprocal movement patterns, incr amplitude of stepping, neural priming. Cues for R knee ADD as it tends to ABDduct. Pt reporting RPE as 5/10 afterwards   NMR  With 4 blaze pods in a circle on random color setting working on turning, weight shifting, visual scanning, and foot clearance, CGA as needed for balance:  Performed 3 bouts of 1 minute each: 8 hits, 7 hits, 6 hits  Pt performs with wide BOS and incr forward posture  Cues throughout for big steps/foot clearance when  going to initiate a turn, pt having freezing episodes where his feet are stuck when trying to turn, with cues provided to weight shift before initiating a big step  With 4" foam beam: Alternating forward stepping and weight shift over obstacle without UE support 10 reps each side Lateral stepping over beam and weight shift 10 reps each side  Pt reporting RPE as 7-8/10 with above exercises   Alternating posterior stepping 10 reps each side with cues for weight shifting, pt initially stepping back with tall posture with cues for hip hinge for counter balance    PATIENT EDUCATION: Education details: Reviewed freezing/turning strategies, see self-care section above  Person educated: Patient and Spouse Education method: Medical illustrator Education comprehension: verbalized understanding and needs further education  HOME EXERCISE PROGRAM: Standing PWR! Moves with chair in front 2 x 10 reps 3x week   GOALS: Goals reviewed with patient? Yes  SHORT TERM GOALS: Target date: 09/18/2023   Pt will be independent with initial HEP for improved strength, balance, transfers and gait.  Baseline: not established on eval  Goal status: INITIAL  2.  Pt will verbalize and  demonstrate freezing strategies to implement at home and in community for improved mobility and reduced fall risk  Baseline:  Goal status: INITIAL  3.  MiniBest to be assessed and LTG updated  Baseline: assessed on 4/7 Goal status: MET   LONG TERM GOALS: Target date: 10/02/2023   Patient will improve miniBEST score to 20/28 to indicate progress towards a decreased risk of falls and improved dynamic and static stability.   Baseline: 16/28 Goal status: INITIAL  2.  Pt will score </= 10/24 on FOGQ for improved freezing strategies and reduced fall risk  Baseline:  Goal status: INITIAL   ASSESSMENT:  CLINICAL IMPRESSION: Today's skilled session focused on SciFit at start of session to work on larger amplitude and  reciprocal movement patterns, turning and stepping strategies. When working on turning, pt performs with wide BOS, forward flexed posture, and tends to have freezing. Cues to stop and weight shifting to help take a bigger step when turning. Pt reporting RPE as 7-8/10 with forward/lateral stepping tasks over obstacles. Will continue per POC.   OBJECTIVE IMPAIRMENTS: Abnormal gait, decreased balance, decreased knowledge of use of DME, decreased mobility, difficulty walking, impaired sensation, postural dysfunction, pain, and dyskinesias     ACTIVITY LIMITATIONS: carrying, lifting, bending, standing, squatting, transfers, locomotion level, and caring for others  PARTICIPATION LIMITATIONS: medication management, driving, shopping, community activity, and yard work  PERSONAL FACTORS: Fitness, Past/current experiences, and 1 comorbidity: PD w/dyskinesias  are also affecting patient's functional outcome.   REHAB POTENTIAL: Good  CLINICAL DECISION MAKING: Evolving/moderate complexity  EVALUATION COMPLEXITY: Moderate  PLAN:  PT FREQUENCY: 2x/week  PT DURATION: 6 weeks  PLANNED INTERVENTIONS: 97164- PT Re-evaluation, 97110-Therapeutic exercises, 97530- Therapeutic activity, [id number]- Neuromuscular re-education, 97535- Self Care, 29562- Manual therapy, [phone]- Gait training, [phone]- Aquatic Therapy, Patient/Family education, Balance training, Dry Needling, Vestibular training, and DME instructions  PLAN FOR NEXT SESSION: review freezing strategies and turning, AD recommendations as appropriate (uses cane during day sometimes and rollator sometimes at night and no AD other times),   review PWR! Moves as needed, stepping strategy (all directions were challenging on miniBEST), work on balance with turns. Did they reach out to Dr. Omar Bibber? Add balance to HEP as appropriate   Seabron [place]ress, PT, DPT 09/05/2023, 8:44 AM

## 2023-09-07 ENCOUNTER — Encounter: Payer: Self-pay | Admitting: Neurology

## 2023-09-11 ENCOUNTER — Encounter: Payer: Self-pay | Admitting: Occupational Therapy

## 2023-09-11 ENCOUNTER — Ambulatory Visit: Admitting: Occupational Therapy

## 2023-09-11 ENCOUNTER — Ambulatory Visit: Admitting: Physical Therapy

## 2023-09-11 DIAGNOSIS — R2681 Unsteadiness on feet: Secondary | ICD-10-CM

## 2023-09-11 DIAGNOSIS — R293 Abnormal posture: Secondary | ICD-10-CM | POA: Diagnosis not present

## 2023-09-11 DIAGNOSIS — R2689 Other abnormalities of gait and mobility: Secondary | ICD-10-CM | POA: Diagnosis not present

## 2023-09-11 DIAGNOSIS — R1310 Dysphagia, unspecified: Secondary | ICD-10-CM | POA: Diagnosis not present

## 2023-09-11 DIAGNOSIS — R471 Dysarthria and anarthria: Secondary | ICD-10-CM | POA: Diagnosis not present

## 2023-09-11 DIAGNOSIS — R278 Other lack of coordination: Secondary | ICD-10-CM | POA: Diagnosis not present

## 2023-09-11 DIAGNOSIS — M5459 Other low back pain: Secondary | ICD-10-CM | POA: Diagnosis not present

## 2023-09-11 DIAGNOSIS — G20B2 Parkinson's disease with dyskinesia, with fluctuations: Secondary | ICD-10-CM | POA: Diagnosis not present

## 2023-09-11 DIAGNOSIS — R29818 Other symptoms and signs involving the nervous system: Secondary | ICD-10-CM

## 2023-09-11 DIAGNOSIS — R29898 Other symptoms and signs involving the musculoskeletal system: Secondary | ICD-10-CM | POA: Diagnosis not present

## 2023-09-11 NOTE — Patient Instructions (Addendum)
 Coordination Exercises  Perform the following exercises for 10-15 minutes 1 times per day. Perform with both hand(s). Perform using big movements.  Flipping Cards: Place deck of cards on the table. Flip cards over one at a time by opening your hand big to grasp and then turn your palm up big. Deal cards: Hold 1/2 or whole deck in your hand. Use thumb to push card off top of deck with one big push. Rotate ball with fingertips: Pick up with fingers/thumb and move as much as you can with each turn/movement (clockwise and counter-clockwise). Toss ball in the air and catch with the same hand: Toss big/high. Rotate 2 golf balls in your hand: Both directions. Pick up coins and place in coin bank or container: Pick up with big, intentional movements. Do not drag coin to the edge. Pick up 5 coins one at a time and hold in palm. Then, move coins from palm to fingertips one at a time to stack. Practice writing: Slow down, write big, and focus on forming each letter.    Performing Daily Activities with Big Movements  Pick at least 2 activities a day and perform with BIG, DELIBERATE movements/effort.  Try different activities each day. This can make the activity easier and turn daily activities into exercise to prevent problems in the future!  If you are standing during the activity, make sure to keep feet apart and stand with good/big/posture.  Examples: Dressing  Pull-over shirt:  good posture, bring shirt to head (don't bring head down), deliberately push arms into sleeves Jacket:  stand with feet apart, deliberately push arms into sleeves Underwear/Pants/Shoes/Socks:  Sit, lean forward, and push each foot in deliberately 1 at a time Open hands to pull down shirt/put on socks/pull up pants--get more material in your hand  Buttoning - Open hands big before fastening each button, deliberate movement (angry buttons--push button through hole), unfasten by using pull-push method   Bathing - Wash/dry  with long strokes  Brushing your teeth - Big, slow movements  Cutting food - Long deliberate cuts, put tip of knife down in front of food  Eating - Hold utensil in the middle (not the end), hold fork straight up and down to stab food  Picking up a cup/bottle - Open hand up big and get object all the way in palm  Opening jar/bottle - Move as much as you can with each turn, twist wrist  Putting on seatbelt - Feet apart, twist when reaching across body, look at where you are reaching  Hanging up clothes/getting clothes down from closet - Reach with big effort, open hand, straighten elbow  Putting away groceries/dishes - Reach with big effort  Wiping counter/table - Move in big, long strokes, open hand  Stirring while cooking - Exaggerate movement  Cleaning windows - Feet apart, move in big, long strokes  Sweeping/Raking- Feet apart and one foot in front of the other, move arms in big, long strokes  Vacuuming - Feet apart and one in front of the other, push with big movement  Folding clothes - Exaggerate arm movements  Washing car - Move in big, long strokes, feet apart  Changing light bulb or Using a screwdriver - Move as much as you can with each turn, twist wrist  Walking into a store/restaurant - Walk with big steps, good posture, swing arms if able  Standing up from a chair/recliner/sofa - Scoot forward, lean forward, and stand with big effort  Picking up something from floor/reaching in low  cabinet - Get close to object, position feet apart and one in front of the other  Perform "Flicks"/hand stretches (PWR! Hands): Close hands then flick out your fingers with focus on opening hands, pulling wrists back, and extending elbows like you are pushing.

## 2023-09-11 NOTE — Therapy (Signed)
 OUTPATIENT PHYSICAL THERAPY NEURO TREATMENT   Patient Name: Jack Cobb MRN: [id number] DOB:[birth year], 73 y.o., male Today's Date: 09/11/2023   PCP: Aloha Arnold, PA-C REFERRING PROVIDER: Debbra Fairy, MD  END OF SESSION:  PT End of Session - 09/11/23 0803     Visit Number 6    Number of Visits 13    Date for PT Re-Evaluation 10/16/23    Authorization Type [employer] Medicare    PT Start Time 0802    PT Stop Time 0841    PT Time Calculation (min) 39 min    Equipment Utilized During Treatment Gait belt    Activity Tolerance Patient tolerated treatment well    Behavior During Therapy WFL for tasks assessed/performed               Past Medical History:  Diagnosis Date   CAD (coronary artery disease), native coronary artery 09/06/2020   Coronary CTA was done showing a coronary calcium  score of 201 placing him at the 60th percentile for age and gender.  He was noted to have 75 to 99% stenosis of the mid LAD with scattered nonobstructive plaquing elsewhere.  FFR 0.7 in D1 and 0.73 at the apex>>medical management   HLD (hyperlipidemia) 09/06/2020   Hypertension    Parkinson disease [facility])    Past Surgical History:  Procedure Laterality Date   BACK SURGERY     2019.2023, january 2024 lumbar surgeries with dr. pool   HERNIA REPAIR     ventral hernia repair with mesh x2   LUMBAR LAMINECTOMY/DECOMPRESSION MICRODISCECTOMY Left 09/19/2022   Procedure: Microdiscectomy - left - Lumbar Four-Lumbar Five;  Surgeon: Agustina Aldrich, MD;  Location: [facility] OR;  Service: Neurosurgery;  Laterality: Left;  3C   Patient Active Problem List   Diagnosis Date Noted   Recurrent herniation of lumbar disc 09/19/2022   CAD (coronary artery disease), native coronary artery 09/06/2020   HLD (hyperlipidemia) 09/06/2020   PAF (paroxysmal atrial fibrillation) (HCC) 06/13/2020   Essential hypertension 06/13/2020   Elevated troponin 06/13/2020   Parkinson's disease (HCC) 12/12/2012    ONSET DATE:  08/15/2023 (referral)  REFERRING DIAG: G20.B2 (ICD-10-CM) - Parkinson's disease with dyskinesia and fluctuating manifestations (HCC) G20.B1 (ICD-10-CM) - 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 States he has not noticed a difference w/medication change. No falls, but several near misses. Still challenged w/turns.   Pt accompanied by: self and   DIL, Jack Cobb   PERTINENT HISTORY: hypertension, HLD, PAF, CAD, s/p hernia repair in 2012, and s/p multiple back surgeries. Diagnosed w/PD in 2007   PAIN:  Are you having pain?  No, just feeling sore form therapy the other day and working on his tractor   PRECAUTIONS: Fall  RED FLAGS: None   WEIGHT BEARING RESTRICTIONS: No  FALLS: Has patient fallen in last 6 months?  No, but several near misses   LIVING ENVIRONMENT: Lives with: lives with their spouse Lives in: House/apartment Stairs: Yes: Internal: 15 steps; on right going up and External: 15 steps; on right going up, on left going up, and can reach both Has following equipment at home: Single point cane and Walker - 2 wheeled  PLOF: Independent  PATIENT GOALS: " Stabilize"   OBJECTIVE:  Note: Objective measures were completed at Evaluation unless otherwise noted.  DIAGNOSTIC FINDINGS: MRI of Lumbar spine from 08/2022  IMPRESSION: 1. New prominent marrow  edema in the left sacral ala, suspicious for insufficiency fracture. 2. New left subarticular disc extrusion at L4-L5 potentially affecting the descending left L5 nerve root. 3. Interval posterior decompression at L5-S1 with improved now mild left lateral recess stenosis at L5-S1. 4. Unchanged severe bilateral neuroforaminal stenosis at L4-L5 and L5-S1.  COGNITION: Overall cognitive status: Within functional limits for tasks assessed   SENSATION: Pt reports frequent numbness of L foot, thinks due to history of low back surgeries.   COORDINATION: Heel to shin test: WNL bilaterally    POSTURE: rounded shoulders, forward head, and difficult to assess due to dyskinesias   LOWER EXTREMITY ROM:     Active  Right Eval Left Eval  Hip flexion    Hip extension    Hip abduction    Hip adduction    Hip internal rotation    Hip external rotation    Knee flexion    Knee extension    Ankle dorsiflexion    Ankle plantarflexion    Ankle inversion    Ankle eversion     (Blank rows = not tested)  LOWER EXTREMITY MMT:  Tested in seated position   MMT Right Eval Left Eval  Hip flexion 5 5  Hip extension    Hip abduction 5 5  Hip adduction 5 5  Hip internal rotation    Hip external rotation    Knee flexion 5 5  Knee extension 5 5  Ankle dorsiflexion 5 5  Ankle plantarflexion    Ankle inversion    Ankle eversion    (Blank rows = not tested)  BED MOBILITY:  Independent per pt   TRANSFERS: Assistive device utilized: None  Sit to stand: Modified independence Stand to sit: Modified independence Chair to chair: Modified independence   GAIT: Gait pattern: step through pattern, decreased stride length, shuffling, festinating, lateral hip instability, trunk flexed, and wide BOS Distance walked: Various clinic distances  Assistive device utilized: Single point cane and None Level of assistance: SBA and CGA Comments: Significant freezing w/turns and walking into doorways. Pt  w/forward flexed posture and fighting his freeze, requiring CGA to avoid anterior LOB.   PATIENT SURVEYS:  Freezing of gait questionnaire 14/24  TREATMENT     Therapeutic Activity:  SciFit with BUE/BLE Multi-Peaks at Gear 8.0 for 8 minutes for reciprocal movement patterns, incr amplitude of stepping, neural priming. Pt reporting RPE as 7/10 afterwards.  The following were performed for improved postural control, spinal mobility and periscapular strength. Pt requires max multimodal cues to perform properly and max verbal cues required to slow down as pt moves impulsively: Quadruped thread the needles, x8 reps per side  Child's pose stretch, 3x20s holds. Max cues to have pt hold pose  Prone supermans, x10 reps. Pt unable to perform due to periscapular weakness  Single arm bent over rows x15 reps per side w/12# KB  NMR  In // bars for improved lateral weight shifting, step clearance and postural control:  On rocker board in A/P direction using mirror for visual biofeedback on body position  Alt retro step w/no UE support, x8 reps per side. Max multimodal cues for upright posture as pt wants to hunch forward. Min A for safety  Alt fwd step w/no UE support. Pt w/improved postural control in fwd direction.  Pt reported 4/10 back pain w/activity, requiring seated rest break  W/heels on blue balance beam: Alt fwd step w/8# med ball slams, x8 reps per side. Max multimodal cues for pt to properly perform as well as alternate feet, as pt only stepping w/LLE. Min A for steadying assist. Mod verbal cues to reach [facility] prior to ball throw for improved posture and power, which was difficult for pt.  RPE 5/10  Back pain at 1/10 at end of session   PATIENT EDUCATION: Education details: Continue HEP  Person educated: Patient Education method: Explanation Education comprehension:  verbalized understanding  HOME EXERCISE PROGRAM: Standing PWR! Moves with chair in front 2 x 10 reps 3x week   GOALS: Goals reviewed with patient? Yes  SHORT TERM GOALS: Target date: 09/18/2023   Pt will be independent with initial HEP for improved strength, balance, transfers and gait.  Baseline: not established on eval  Goal status: INITIAL  2.  Pt will verbalize and demonstrate freezing strategies to implement at home and in community for improved mobility and reduced fall risk  Baseline:  Goal status: INITIAL  3.  MiniBest to be assessed and LTG updated  Baseline: assessed on 4/7 Goal status: MET   LONG TERM GOALS: Target date: 10/02/2023   Patient will improve miniBEST score to 20/28 to indicate progress towards a decreased risk of falls and improved dynamic and static stability.   Baseline: 16/28 Goal status: INITIAL  2.  Pt will score </= 10/24 on FOGQ for improved freezing strategies and reduced fall risk  Baseline:  Goal status: INITIAL   ASSESSMENT:  CLINICAL IMPRESSION: Emphasis of skilled PT session on postural control, lateral weight shifting and spinal mobility. Pt reports he has not noted a change in balance w/medication change. Pt w/significant forward flexed posture and low back pain, so attempted spinal mobility exercises but pt requires max multimodal cues to properly perform, so did not add to HEP this date. Pt requires mod cues to slow down w/activities and continues to fight through his freeze, but reports he has been working on this at home. Will continue per POC.   OBJECTIVE IMPAIRMENTS: Abnormal gait, decreased balance, decreased knowledge of use of DME, decreased mobility, difficulty walking, impaired sensation, postural dysfunction, pain, and dyskinesias     ACTIVITY LIMITATIONS: carrying, lifting, bending, standing, squatting, transfers, locomotion level, and caring for others  PARTICIPATION LIMITATIONS: medication management, driving, shopping,  community activity, and yard work  PERSONAL FACTORS: Fitness, Past/current experiences, and 1 comorbidity: PD w/dyskinesias  are also affecting patient's functional outcome.   REHAB POTENTIAL: Good  CLINICAL DECISION MAKING: Evolving/moderate complexity  EVALUATION COMPLEXITY: Moderate  PLAN:  PT FREQUENCY: 2x/week  PT DURATION: 6 weeks  PLANNED INTERVENTIONS: 97164- PT Re-evaluation, 97110-Therapeutic exercises, 97530- Therapeutic activity, [id number]- Neuromuscular re-education, 97535- Self Care, 62130- Manual therapy, [phone]- Gait training, [phone]- Aquatic Therapy, Patient/Family education, Balance training, Dry Needling, Vestibular training, and DME instructions  PLAN FOR NEXT SESSION: review freezing strategies and turning, AD recommendations as appropriate (uses cane during day sometimes and rollator sometimes at night and no AD other times),   review PWR! Moves as needed, stepping strategy (all directions were challenging on miniBEST), work on balance with turns. Did they reach out to Dr. Omar Bibber? Add balance to HEP as appropriate   Keelee Yankey E Zak Gondek, PT, DPT 09/11/2023, 8:45 AM

## 2023-09-11 NOTE — Therapy (Unsigned)
 OUTPATIENT SPEECH LANGUAGE PATHOLOGY PARKINSON'S EVALUATION   Patient Name: Jack Cobb MRN: [id number] DOB:04/05/1951, 73 y.o., male Today's Date: 09/14/2023  PCP: Aloha Arnold, PA-C REFERRING PROVIDER: Debbra Fairy, MD  END OF SESSION:  End of Session - 09/14/23 0808     Visit Number 1    Number of Visits 9    Date for SLP Re-Evaluation 10/26/23   to accomodate scheduling   Authorization Type UHC auth required    SLP Start Time 0800    SLP Stop Time  0845    SLP Time Calculation (min) 45 min    Activity Tolerance Patient tolerated treatment well             Past Medical History:  Diagnosis Date   CAD (coronary artery disease), native coronary artery 09/06/2020   Coronary CTA was done showing a coronary calcium  score of 201 placing him at the 60th percentile for age and gender.  He was noted to have 75 to 99% stenosis of the mid LAD with scattered nonobstructive plaquing elsewhere.  FFR 0.7 in D1 and 0.73 at the apex>>medical management   HLD (hyperlipidemia) 09/06/2020   Hypertension    Parkinson disease [facility])    Past Surgical History:  Procedure Laterality Date   BACK SURGERY     2019.2023, january 2024 lumbar surgeries with dr. pool   HERNIA REPAIR     ventral hernia repair with mesh x2   LUMBAR LAMINECTOMY/DECOMPRESSION MICRODISCECTOMY Left 09/19/2022   Procedure: Microdiscectomy - left - Lumbar Four-Lumbar Five;  Surgeon: Agustina Aldrich, MD;  [facility];  Service: Neurosurgery;  Laterality: Left;  3C   Patient Active Problem List   Diagnosis Date Noted   Recurrent herniation of lumbar disc 09/19/2022   CAD (coronary artery disease), native coronary artery 09/06/2020   HLD (hyperlipidemia) 09/06/2020   PAF (paroxysmal atrial fibrillation) (HCC) 06/13/2020   Essential hypertension 06/13/2020   Elevated troponin 06/13/2020   Parkinson's disease (HCC) 12/12/2012    ONSET DATE: 08/22/2023 referral date  REFERRING DIAG:  G20.B2 (ICD-10-CM) - Parkinson's  disease with dyskinesia, with fluctuations (HCC)  R47.1 (ICD-10-CM) - Dysarthria    THERAPY DIAG:  Dysarthria  Cognitive communication deficit  Dysphagia, unspecified type  Rationale for Evaluation and Treatment: Rehabilitation  SUBJECTIVE:   SUBJECTIVE STATEMENT: Pt reports speech highly variable day to day. Also endorses challenges with saliva management, occasional difficulty swallowing  Pt accompanied by: significant other  PERTINENT HISTORY: HTN, HLD, CAD, PAF, dx with PD 2007 with no hx of tx   PAIN:  Are you having pain? No  FALLS: Has patient fallen in last 6 months?  See PT evaluation for details  LIVING ENVIRONMENT: Lives with: lives with their spouse Lives in: House/apartment  PLOF: Independent, retired   PATIENT GOALS: improved communication  OBJECTIVE:  Note: Objective measures were completed at Evaluation unless otherwise noted.  COGNITION: Overall cognitive status: Within functional limits for tasks assessed Areas of impairment: Memory Comments: per pt report, mild changes in memory. Inability to complete mental math per baseline. Still uses saw mill and have trouble with measurements and calculations   MOTOR SPEECH: assessed across variety of speech tasks: reading, word repetition, generative discourse sample Overall motor speech: impaired Level of impairment: Word Rate of Speech: Variable Dysfluencies: none evidenced Phonation: breathy and low vocal intensity  Sustained phonation duration: 12 seconds Oral reading loudness average: 68 dB Conversational loudness average: 67 dB Voice Quality: hoarse, breathy, and strained  S/Z ratio: 11/9 Respiration: speaking on residual  capacity Word and Phrasal Stress: reduced use of stress Resonance: WFL Articulation: Impaired: word Diadochokinetic Rate (DDK): irregular rhythm and poorly sequenced Intelligibility: Intelligible Motor planning: Appears intact Effective technique: slow rate, increased vocal  intensity, over articulate, and pacing  Stimulability trials: Given SLP modeling and usual mod cues, loudness average increased to 76dB (range of 72 to 78 dB) at (loud "ah", word, sentence, paragraph, conversation) level.    ORAL MOTOR EXAMINATION: Overall status: Reduced lingual coordination/ROM   PATIENT REPORTED OUTCOME MEASURES (PROM): The Communicative Participation Item Bank        Does your condition interfere with... Pt Rating   ...talking with people you know 0   ...communicating when you need to say something quickly 0   ...talking with people you do not know 1   ...communicating when you are out in your community 0   ...asking questions in a conversation 1   ....communicating in a small group of people 2   ...having a long conversation 2   ...giving detailed infomrmation 1   ...getting your turn in a fast moving conversation 0   ...trying to persuade a friend or family member to see a different point of view 2  3= Not at all; 2=A little; 1=Quite a bit; 0=Very much                                                                                                                            TREATMENT DATE:  09/11/2023: Evaluation completed and generated POC. SLP introduced Speak Out program and principle of intentional speech to aid in improve vocal quality and increased intensity. Led pt through warm up exercises with provision of usual SLP model and mod-A for accurate completion of exercises. Max-A needed for completion of glides and modification provided for use of stair steps.   PATIENT EDUCATION: Education details: see above Person educated: Patient and Spouse Education method: Explanation, Facilities manager, Verbal cues, and Handouts Education comprehension: verbalized understanding, returned demonstration, and needs further education  HOME EXERCISE PROGRAM: Watch PD voice project video    GOALS: Goals reviewed with patient? Yes  SHORT TERM GOALS: Target date:  10/12/2023  Pt will complete HEP 5/7 days per week over 1 week period with mod-I  Baseline: Goal status: INITIAL  2.  Pt will meet or exceed target dB for Speak Out exercises up to reading level in 2/2 sessions with min-A Baseline:  Goal status: INITIAL  3.  Pt will complete clinical swallow evaluation Baseline:  Goal status: INITIAL  4.  Pt will average 70+ dB over 8 minute conversation with rare verbal or visual cues  Baseline:  Goal status: INITIAL   LONG TERM GOALS: Target date: 10/26/2023  Pt will verbalize HEP plan for ongoing practice following ST d/c  Baseline:  Goal status: INITIAL  2.  Pt will meet or exceed target dB for Speak Out cognitive and conversational exercises up to  in 2/2 sessions with min-A Baseline:  Goal status:  INITIAL  3.  Pt will average 70+ dB over 25 minute conversation with rare verbal or visual cues  Baseline:  Goal status: INITIAL  4.  Pt will teach back saliva management strategies with mod-I  Baseline:  Goal status: INITIAL   ASSESSMENT:  CLINICAL IMPRESSION: Patient is a 73 y.o. M who was seen today for motor speech in setting of PD. Dx 2007 with no prior therapy. Pt reports variable communication success with challenges in being heard in presence of background noise and perception of slurred speech, more so in afternoon. Endorsing mild swallowing changes and sialorrhea, warranting further evaluation, to be completed next scheduled session. Evaluation reveals moderate dysarthria. Pt's speech is c/b reduced breath support, impercise articulation, low vocal intensity and occasional breathy vocal quality. Initiated training for [employer] with pt able to demonstrate completion of warm up exercises with clinician model and feedback. I recommend skilled ST to address dysarthria, evaluate for swallowing.   OBJECTIVE IMPAIRMENTS: Objective impairments include dysarthria and voice disorder. These impairments are limiting patient from  effectively communicating at home and in community.Factors affecting potential to achieve goals and functional outcome are co-morbidities and previous level of function.. Patient will benefit from skilled SLP services to address above impairments and improve overall function.  REHAB POTENTIAL: Good  PLAN:  SLP FREQUENCY: 2x/week  SLP DURATION: 6 weeks  PLANNED INTERVENTIONS: Aspiration precaution training, Pharyngeal strengthening exercises, Diet toleration management , Cueing hierachy, Internal/external aids, Functional tasks, SLP instruction and feedback, Compensatory strategies, Patient/family education, [phone] Treatment of speech (30 or 45 min) , and [id number] Treatment of swallowing function   Alston Jerry, CCC-SLP 09/14/2023, 9:25 AM

## 2023-09-11 NOTE — Therapy (Signed)
 OUTPATIENT OCCUPATIONAL THERAPY PARKINSON'S TREATMENT  Patient Name: Jack Cobb MRN: [id number] DOB:02/26/1951, 73 y.o., male Today's Date: 09/11/2023  PCP: Aloha Arnold, PA-C  REFERRING PROVIDER: Debbra Fairy, MD  END OF SESSION:  OT End of Session - 09/11/23 0847     Visit Number 3    Number of Visits 9    Date for OT Re-Evaluation 09/26/23    Authorization Type [employer] Medicare - [employer] req    OT Start Time 0845    OT Stop Time 0930    OT Time Calculation (min) 45 min    Activity Tolerance Patient tolerated treatment well    Behavior During Therapy [facility] for tasks assessed/performed             Past Medical History:  Diagnosis Date   CAD (coronary artery disease), native coronary artery 09/06/2020   Coronary CTA was done showing a coronary calcium  score of 201 placing him at the 60th percentile for age and gender.  He was noted to have 75 to 99% stenosis of the mid LAD with scattered nonobstructive plaquing elsewhere.  FFR 0.7 in D1 and 0.73 at the apex>>medical management   HLD (hyperlipidemia) 09/06/2020   Hypertension    Parkinson disease [facility])    Past Surgical History:  Procedure Laterality Date   BACK SURGERY     2019.2023, january 2024 lumbar surgeries with dr. pool   HERNIA REPAIR     ventral hernia repair with mesh x2   LUMBAR LAMINECTOMY/DECOMPRESSION MICRODISCECTOMY Left 09/19/2022   Procedure: Microdiscectomy - left - Lumbar Four-Lumbar Five;  Surgeon: Agustina Aldrich, MD;  Location: [facility] OR;  Service: Neurosurgery;  Laterality: Left;  3C   Patient Active Problem List   Diagnosis Date Noted   Recurrent herniation of lumbar disc 09/19/2022   CAD (coronary artery disease), native coronary artery 09/06/2020   HLD (hyperlipidemia) 09/06/2020   PAF (paroxysmal atrial fibrillation) (HCC) 06/13/2020   Essential hypertension 06/13/2020   Elevated troponin 06/13/2020   Parkinson's disease (HCC) 12/12/2012    ONSET DATE: 08/15/2023 (Date of referral); has been dx  with PD since 2007  REFERRING DIAG:  G20.B1 (ICD-10-CM) - Parkinson's disease with dyskinesia, without mention of fluctuations  G20.B2 (ICD-10-CM) - Parkinson's disease with dyskinesia, with fluctuations  K59.00 (ICD-10-CM) - Constipation, unspecified  R29.818 (ICD-10-CM) - Other symptoms and signs involving the nervous system  R29.898 (ICD-10-CM) - Other symptoms and signs involving the musculoskeletal system  I49.9 (ICD-10-CM) - Cardiac arrhythmia, unspecified  R29.3 (ICD-10-CM) - Abnormal posture   THERAPY DIAG:  Unsteadiness on feet  Other abnormalities of gait and mobility  Abnormal posture  Other lack of coordination  Other symptoms and signs involving the nervous system  Other symptoms and signs involving the musculoskeletal system  Rationale for Evaluation and Treatment: Rehabilitation  SUBJECTIVE:   SUBJECTIVE STATEMENT: No new falls and no pain in neck today.  Wife reports he has stayed active but has noticed small changes/difficulty (handwriting, buttons, using screwdriver)  Pt accompanied by: significant other - daughter Landa Pine)  PERTINENT HISTORY: PMH - hypertension, HLD, PAF, CAD, s/p hernia repair in 2012, and s/p multiple back surgeries   PRECAUTIONS: Fall  WEIGHT BEARING RESTRICTIONS: No  PAIN:  Are you having pain? Yes: NPRS scale: 1-2/10 Pain location: neck Pain description: stiff; numb Aggravating factors: turning head Relieving factors: medications; CBD cream  FALLS: Has patient fallen in last 6 months? No  LIVING ENVIRONMENT: Lives with: lives with their spouse Lives in: House/apartment Stairs: Yes: Internal: 15 steps; can  reach both and External: 15 steps; can reach both Has following equipment at home: Single point cane and Walker - 2 wheeled  PLOF: Independent; retired [employer]; not driving for the last year  PATIENT GOALS: maintain current level of function  OBJECTIVE:  Note: Objective measures were completed at  Evaluation unless otherwise noted.  HAND DOMINANCE: Right  ADLs: Overall ADLs: mod I  IADLs: Light housekeeping: mod I Meal Prep: mod I Community mobility: dependent Medication management: setup Financial management: mod I Handwriting: 50% legible and Severe micrographia  MOBILITY STATUS: Independent - uses cane  POSTURE COMMENTS:  rounded shoulders and forward head  ACTIVITY TOLERANCE: Activity tolerance: good  FUNCTIONAL OUTCOME MEASURES: Fastening/unfastening 3 buttons: 43 seconds Physical performance test: PPT#2 (simulated eating) 12 seconds & PPT#4 (donning/doffing jacket): 10 seconds  COORDINATION: 9 Hole Peg test: Right: 31 sec; Left: 29 sec Box and Blocks:  Right 47 blocks, Left 50 blocks Tremors: Resting, action, Right, Left, and head  UE ROM:  WFL  UE MMT:   WFL  SENSATION: WFL  MUSCLE TONE: WFL  COGNITION: Overall cognitive status: Within functional limits for tasks assessed  OBSERVATIONS: Bradykinesia, Dyskinesias, and Postural tremors                                                                                                                   TREATMENT:   Reviewed PWR! Hands with patient/daughter  Pt issued coordination HEP - see pt instructions for details. Pt with motor planning deficits for rotating ball in fingertips and rotating golf balls in hands.   Pt return demo of hooking/unhooking buttons after reviewing strategies and opening twist lids.   Also issued and reviewed big ADL strategies - see pt instructions for details Focused on buttons, twist lids, handwriting, opening hand big around objects, cutting food, and putting on seatbelt  PATIENT EDUCATION: Education details: see above Person educated: Patient and Child(ren) Education method: Explanation, Demonstration, Verbal cues, and Handouts Education comprehension: verbalized understanding, returned demonstration, verbal cues required, and needs further education  HOME EXERCISE  PROGRAM: 08/27/23: [place]! Hands, handwriting strategies 09/11/23: coordination HEP, big ADL strategies  GOALS:  SHORT TERM GOALS: Target date: 09/18/2023    Pt will be independent with PD specific HEP.  Baseline: not yet initiated Goal status: IN PROGRESS  2.  Pt will verbalize understanding of adapted strategies to maximize safety and independence with ADLs/IADLs.  Baseline: not yet initiated Goal status: IN PROGRESS   LONG TERM GOALS: Target date: 09/26/2023    Pt will verbalize understanding of ways to prevent future PD related complications and PD community resources.  Baseline: not yet initiated Goal status: INITIAL  2.  Pt will write a short paragraph with no significant decrease in size and maintain 100% legibility.  Baseline: 50% legibility with moderate micrographia Goal status: INITIAL  3.  Pt will verbalize understanding of ways to keep thinking skills sharp and ways to compensate for STM changes in the future.  Baseline: not yet initiated Goal status: INITIAL  4.  Pt will demonstrate improved ease with fastening buttons as evidenced by decreasing 3 button/unbutton time to 35 seconds or less.  Baseline: 43 seconds Goal status: INITIAL  5.  Pt will be able to place at least 50 blocks using R hand with completion of Box and Blocks test.  Baseline: 47 blocks Goal status: INITIAL   ASSESSMENT:  CLINICAL IMPRESSION: Patient seen today for occupational therapy treatment for PD. Hx includes hypertension, HLD, PAF, CAD, s/p hernia repair in 2012, and s/p multiple back surgeries . Pt with greater understanding of PD symptoms and strategies to combat this. Patient currently presents below baseline level of functioning demonstrating functional deficits and impairments as noted below. Pt would benefit from continued skilled OT services in the outpatient setting to work on impairments as noted below to help pt return to PLOF as able.    PERFORMANCE DEFICITS: in functional  skills including ADLs, IADLs, coordination, Fine motor control, Gross motor control, and UE functional use.   IMPAIRMENTS: are limiting patient from ADLs, IADLs, and leisure.   COMORBIDITIES:  may have co-morbidities  that affects occupational performance. Patient will benefit from skilled OT to address above impairments and improve overall function.  MODIFICATION OR ASSISTANCE TO COMPLETE EVALUATION: Min-Moderate modification of tasks or assist with assess necessary to complete an evaluation.  OT OCCUPATIONAL PROFILE AND HISTORY: Detailed assessment: Review of records and additional review of physical, cognitive, psychosocial history related to current functional performance.  CLINICAL DECISION MAKING: Moderate - several treatment options, min-mod task modification necessary  REHAB POTENTIAL: Fair given PD prognosis  EVALUATION COMPLEXITY: Moderate    PLAN:  OT FREQUENCY: 2x/week  OT DURATION: 4 weeks (extended to 9 total visits due to scheduling issues)  PLANNED INTERVENTIONS: 96045 OT Re-evaluation, 97535 self care/ADL training, 40981 therapeutic exercise, 97530 therapeutic activity, 97112 neuromuscular re-education, functional mobility training, and patient/family education  RECOMMENDED OTHER SERVICES: Recommend ST referral for evaluation and treatment of cognition and speech.   CONSULTED AND AGREED WITH PLAN OF CARE: Patient and family member/caregiver  PLAN FOR NEXT SESSION: PWR! Seated, practice jacket   Velinda Getting, OT 09/11/2023, 8:47 AM

## 2023-09-14 ENCOUNTER — Ambulatory Visit: Admitting: Physical Therapy

## 2023-09-14 ENCOUNTER — Ambulatory Visit: Admitting: Occupational Therapy

## 2023-09-14 ENCOUNTER — Ambulatory Visit: Admitting: Speech Pathology

## 2023-09-14 ENCOUNTER — Encounter: Payer: Self-pay | Admitting: Speech Pathology

## 2023-09-14 DIAGNOSIS — R1310 Dysphagia, unspecified: Secondary | ICD-10-CM

## 2023-09-14 DIAGNOSIS — R29898 Other symptoms and signs involving the musculoskeletal system: Secondary | ICD-10-CM

## 2023-09-14 DIAGNOSIS — R2681 Unsteadiness on feet: Secondary | ICD-10-CM | POA: Diagnosis not present

## 2023-09-14 DIAGNOSIS — G20B2 Parkinson's disease with dyskinesia, with fluctuations: Secondary | ICD-10-CM

## 2023-09-14 DIAGNOSIS — R471 Dysarthria and anarthria: Secondary | ICD-10-CM

## 2023-09-14 DIAGNOSIS — R41841 Cognitive communication deficit: Secondary | ICD-10-CM

## 2023-09-14 DIAGNOSIS — R29818 Other symptoms and signs involving the nervous system: Secondary | ICD-10-CM

## 2023-09-14 DIAGNOSIS — R293 Abnormal posture: Secondary | ICD-10-CM | POA: Diagnosis not present

## 2023-09-14 DIAGNOSIS — R2689 Other abnormalities of gait and mobility: Secondary | ICD-10-CM

## 2023-09-14 DIAGNOSIS — R278 Other lack of coordination: Secondary | ICD-10-CM

## 2023-09-14 DIAGNOSIS — M5459 Other low back pain: Secondary | ICD-10-CM | POA: Diagnosis not present

## 2023-09-14 NOTE — Patient Instructions (Signed)
SPEAK OUT! is a structured program targeting voice in patient's with Parkinson's. This program was developed by [employer]. It is evidence based and based on principles of motor learning. The nonprofit provides free materials to patients being treated by a Speak Out! certified SLP.   www.parkinsonvoiceproject.org for more information  Learn about Parkinson's webinar: https://parkinsonvoiceproject.org/education-training/monthly-webinar/ -- highly recommend watching this webinar!!  Order your stimulus booklet: [phone] Your provider: Cannan Beeck SLP, [facility]  This book is free!! They do offer a "pay if forward" option where you can donate to the non-profit organization so they can continue serving the Parkinson's population and providing these valuable resources to patients.    

## 2023-09-14 NOTE — Therapy (Signed)
 OUTPATIENT OCCUPATIONAL THERAPY PARKINSON'S TREATMENT  Patient Name: Jack Cobb MRN: [id number] DOB:1951-05-05, 73 y.o., male Today's Date: 09/14/2023  PCP: Aloha Arnold, PA-C  REFERRING PROVIDER: Debbra Fairy, MD  END OF SESSION:  OT End of Session - 09/14/23 [phone]     Visit Number 4    Number of Visits 9    Date for OT Re-Evaluation 09/26/23    Authorization Type [employer] Medicare - [employer] req    OT Start Time [phone]    OT Stop Time 1017    OT Time Calculation (min) 43 min    Activity Tolerance Patient tolerated treatment well    Behavior During Therapy [facility] for tasks assessed/performed             Past Medical History:  Diagnosis Date   CAD (coronary artery disease), native coronary artery 09/06/2020   Coronary CTA was done showing a coronary calcium  score of 201 placing him at the 60th percentile for age and gender.  He was noted to have 75 to 99% stenosis of the mid LAD with scattered nonobstructive plaquing elsewhere.  FFR 0.7 in D1 and 0.73 at the apex>>medical management   HLD (hyperlipidemia) 09/06/2020   Hypertension    Parkinson disease [facility])    Past Surgical History:  Procedure Laterality Date   BACK SURGERY     2019.2023, january 2024 lumbar surgeries with dr. pool   HERNIA REPAIR     ventral hernia repair with mesh x2   LUMBAR LAMINECTOMY/DECOMPRESSION MICRODISCECTOMY Left 09/19/2022   Procedure: Microdiscectomy - left - Lumbar Four-Lumbar Five;  Surgeon: Agustina Aldrich, MD;  Location: [facility] OR;  Service: Neurosurgery;  Laterality: Left;  3C   Patient Active Problem List   Diagnosis Date Noted   Recurrent herniation of lumbar disc 09/19/2022   CAD (coronary artery disease), native coronary artery 09/06/2020   HLD (hyperlipidemia) 09/06/2020   PAF (paroxysmal atrial fibrillation) (HCC) 06/13/2020   Essential hypertension 06/13/2020   Elevated troponin 06/13/2020   Parkinson's disease (HCC) 12/12/2012    ONSET DATE: 08/15/2023 (Date of referral); has been dx  with PD since 2007  REFERRING DIAG:  G20.B1 (ICD-10-CM) - Parkinson's disease with dyskinesia, without mention of fluctuations  G20.B2 (ICD-10-CM) - Parkinson's disease with dyskinesia, with fluctuations  K59.00 (ICD-10-CM) - Constipation, unspecified  R29.818 (ICD-10-CM) - Other symptoms and signs involving the nervous system  R29.898 (ICD-10-CM) - Other symptoms and signs involving the musculoskeletal system  I49.9 (ICD-10-CM) - Cardiac arrhythmia, unspecified  R29.3 (ICD-10-CM) - Abnormal posture   THERAPY DIAG:  Other lack of coordination  Other symptoms and signs involving the nervous system  Other symptoms and signs involving the musculoskeletal system  Parkinson's disease with dyskinesia, with fluctuations (HCC)  Rationale for Evaluation and Treatment: Rehabilitation  SUBJECTIVE:   SUBJECTIVE STATEMENT: He is about due to take his PD medication at the start of his therapy session. His low back pain increases during the off time of his medication.   Pt accompanied by: significant other   PERTINENT HISTORY: PMH - hypertension, HLD, PAF, CAD, s/p hernia repair in 2012, and s/p multiple back surgeries   PRECAUTIONS: Fall  WEIGHT BEARING RESTRICTIONS: No  PAIN:  Are you having pain? Yes: NPRS scale: 5/10 Pain location: low back Pain description: stiff; sore Aggravating factors: medication timing Relieving factors: medications; CBD cream  FALLS: Has patient fallen in last 6 months? 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  MOBILITY STATUS: Independent - uses cane  POSTURE COMMENTS:  rounded shoulders and forward head  ACTIVITY TOLERANCE: Activity tolerance: good  FUNCTIONAL OUTCOME MEASURES: Fastening/unfastening 3 buttons: 43 seconds Physical performance test: PPT#2 (simulated eating) 12 seconds & PPT#4 (donning/doffing jacket): 10 seconds  COORDINATION: 9 Hole Peg test: Right: 31 sec; Left: 29 sec Box and Blocks:  Right 47 blocks, Left 50 blocks Tremors: Resting, action, Right, Left, and head  UE ROM:  WFL  UE MMT:   WFL  SENSATION: WFL  MUSCLE TONE: WFL  COGNITION: Overall cognitive status: Within functional limits for tasks assessed  OBSERVATIONS: Bradykinesia, Dyskinesias, and Postural tremors                                                                                                                   TREATMENT:   OT educated pt on table top play of Solitaire for BUE ROM, coordination, visual processing, scanning, and sequencing. Pt required minimal cues for proper play.   Pt participated in 1 games of Uzzle at level(s) 1 using RUE and LUE for eye-hand coordination and cognition by matching game pieces to visual patterns. Pt demonstrating ability to solve level(s) 1 with mod A from therapist.  OT initiated PWR! Sitting. Education provided on the importance of completion and why these exercises are important given PD progression.     PATIENT EDUCATION: Education details: see above Person educated: Patient and Spouse Education method: Explanation, Demonstration, Verbal cues, and Handouts Education comprehension: verbalized understanding, returned demonstration, verbal cues required, and needs further education  HOME EXERCISE PROGRAM: 08/27/23: PWR!  Hands, handwriting strategies 09/11/23: coordination HEP, big ADL strategies 09/14/2023: PWR! Sitting  GOALS:  SHORT TERM GOALS: Target date: 09/18/2023    Pt will be independent with PD specific HEP.  Baseline: not yet initiated Goal status: IN PROGRESS  2.  Pt will verbalize understanding of adapted strategies to maximize safety and independence with ADLs/IADLs.  Baseline: not yet initiated Goal status: IN PROGRESS   LONG TERM GOALS: Target date: 09/26/2023    Pt will verbalize understanding of ways to prevent future PD related complications and PD community resources.  Baseline: not yet initiated Goal status: INITIAL  2.  Pt will write a short paragraph with no significant decrease in size and maintain 100% legibility.  Baseline: 50% legibility with moderate micrographia Goal status: INITIAL  3.  Pt will verbalize understanding of ways to keep thinking skills sharp and ways to compensate for STM changes in the future.  Baseline: not yet initiated Goal status: INITIAL  4.  Pt will demonstrate improved ease with fastening buttons as evidenced by decreasing 3 button/unbutton time to 35 seconds or less.  Baseline: 43 seconds Goal status: INITIAL  5.  Pt will be able to place at least 50 blocks using R hand with completion of Box and Blocks test.  Baseline: 47 blocks Goal status: INITIAL   ASSESSMENT:  CLINICAL IMPRESSION: Patient demonstrates good understanding of HEP though will require repeat education for carryover. Pt appears to be receptive to functional, game based coordination activities to be complete as part of PD symptom management.   PERFORMANCE DEFICITS: in functional skills including ADLs, IADLs, coordination, Fine motor control, Gross motor control, and UE functional use.   IMPAIRMENTS: are limiting patient from ADLs, IADLs, and leisure.   COMORBIDITIES:  may have co-morbidities  that affects occupational performance. Patient will benefit from skilled  OT to address above impairments and improve overall function.  REHAB POTENTIAL: Fair given PD prognosis  PLAN:  OT FREQUENCY: 2x/week  OT DURATION: 4 weeks (extended to 9 total visits due to scheduling issues)  PLANNED INTERVENTIONS: 97168 OT Re-evaluation, 97535 self care/ADL training, 78469 therapeutic exercise, 97530 therapeutic activity, 97112 neuromuscular re-education, functional mobility training, and patient/family education  RECOMMENDED OTHER SERVICES: Recommend ST referral for evaluation and treatment of cognition and speech.   CONSULTED AND AGREED WITH PLAN OF CARE: Patient and family member/caregiver  PLAN FOR NEXT SESSION: PWR! Seated, practice jacket   Altamease Asters, OT 09/14/2023, 10:19 AM

## 2023-09-14 NOTE — Therapy (Signed)
 OUTPATIENT PHYSICAL THERAPY NEURO TREATMENT   Patient Name: Jack Cobb MRN: [id number] DOB:[birth year], 73 y.o., male Today's Date: 09/14/2023   PCP: Aloha Arnold, PA-C REFERRING PROVIDER: Debbra Fairy, MD  END OF SESSION:  PT End of Session - 09/14/23 0845     Visit Number 7    Number of Visits 13    Date for PT Re-Evaluation 10/16/23    Authorization Type [employer] Medicare    Authorization Time Period 08/21/23 - 10/02/23 13 PT visits    PT Start Time 0844    PT Stop Time 0927    PT Time Calculation (min) 43 min    Equipment Utilized During Treatment Gait belt    Activity Tolerance Patient tolerated treatment well    Behavior During Therapy [facility] for tasks assessed/performed                Past Medical History:  Diagnosis Date   CAD (coronary artery disease), native coronary artery 09/06/2020   Coronary CTA was done showing a coronary calcium  score of 201 placing him at the 60th percentile for age and gender.  He was noted to have 75 to 99% stenosis of the mid LAD with scattered nonobstructive plaquing elsewhere.  FFR 0.7 in D1 and 0.73 at the apex>>medical management   HLD (hyperlipidemia) 09/06/2020   Hypertension    Parkinson disease [facility])    Past Surgical History:  Procedure Laterality Date   BACK SURGERY     2019.2023, january 2024 lumbar surgeries with dr. pool   HERNIA REPAIR     ventral hernia repair with mesh x2   LUMBAR LAMINECTOMY/DECOMPRESSION MICRODISCECTOMY Left 09/19/2022   Procedure: Microdiscectomy - left - Lumbar Four-Lumbar Five;  Surgeon: Agustina Aldrich, MD;  Location: [facility] OR;  Service: Neurosurgery;  Laterality: Left;  3C   Patient Active Problem List   Diagnosis Date Noted   Recurrent herniation of lumbar disc 09/19/2022   CAD (coronary artery disease), native coronary artery 09/06/2020   HLD (hyperlipidemia) 09/06/2020   PAF (paroxysmal atrial fibrillation) (HCC) 06/13/2020   Essential hypertension 06/13/2020   Elevated troponin 06/13/2020    Parkinson's disease (HCC) 12/12/2012    ONSET DATE: 08/15/2023 (referral)  REFERRING DIAG: G20.B2 (ICD-10-CM) - Parkinson's disease with dyskinesia and fluctuating manifestations (HCC) G20.B1 (ICD-10-CM) - 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 States his neck is sore but back feels okay. Has been working on his HEP when he can. Still struggling w/freezing w/turns.   Pt accompanied by: self and   Wife  PERTINENT HISTORY: hypertension, HLD, PAF, CAD, s/p hernia repair in 2012, and s/p multiple back surgeries. Diagnosed w/PD in 2007   PAIN:  Are you having pain?  No, just feeling sore form therapy the other day and working on his tractor   PRECAUTIONS: Fall  RED FLAGS: None   WEIGHT BEARING RESTRICTIONS: No  FALLS: Has patient fallen in last 6 months?  No, but several near misses   LIVING ENVIRONMENT: Lives with: lives with their spouse Lives in: House/apartment Stairs: Yes: Internal: 15 steps; on right going up and External: 15 steps; on right going up, on left going up, and can reach both Has following equipment at home: Single point cane and Walker - 2 wheeled  PLOF: Independent  PATIENT GOALS: " Stabilize"   OBJECTIVE:  Note: Objective measures were completed at Evaluation unless otherwise noted.  DIAGNOSTIC FINDINGS: MRI of Lumbar spine  from 08/2022  IMPRESSION: 1. New prominent marrow edema in the left sacral ala, suspicious for insufficiency fracture. 2. New left subarticular disc extrusion at L4-L5 potentially affecting the descending left L5 nerve root. 3. Interval posterior decompression at L5-S1 with improved now mild left lateral recess stenosis at L5-S1. 4. Unchanged severe bilateral neuroforaminal stenosis at L4-L5 and L5-S1.  COGNITION: Overall cognitive status: Within functional limits for tasks assessed   SENSATION: Pt reports frequent numbness of L foot, thinks due to history of low back surgeries.   COORDINATION: Heel to shin test: WNL bilaterally    POSTURE: rounded shoulders, forward head, and difficult to assess due to dyskinesias   LOWER EXTREMITY ROM:     Active  Right Eval Left Eval  Hip flexion    Hip extension    Hip abduction    Hip adduction    Hip internal rotation    Hip external rotation    Knee flexion    Knee extension    Ankle dorsiflexion    Ankle plantarflexion    Ankle inversion    Ankle eversion     (Blank rows = not tested)  LOWER EXTREMITY MMT:  Tested in seated position   MMT Right Eval Left Eval  Hip flexion 5 5  Hip extension    Hip abduction 5 5  Hip adduction 5 5  Hip internal rotation    Hip external rotation    Knee flexion 5 5  Knee extension 5 5  Ankle dorsiflexion 5 5  Ankle plantarflexion    Ankle inversion    Ankle eversion    (Blank rows = not tested)  BED MOBILITY:  Independent per pt   TRANSFERS: Assistive device utilized: None  Sit to stand: Modified independence Stand to sit: Modified independence Chair to chair: Modified independence   GAIT: Gait pattern: step through pattern, decreased stride length, shuffling, festinating, lateral hip instability, trunk flexed, and wide BOS Distance walked: Various clinic distances  Assistive device utilized: Single point cane and None Level of assistance: SBA and CGA Comments:  Significant freezing w/turns and walking into doorways. Pt w/forward flexed posture and fighting his freeze, requiring CGA to avoid anterior LOB.   PATIENT SURVEYS:  Freezing of gait questionnaire 14/24  TREATMENT     Therapeutic Activity:  SciFit with BUE/BLE Multi-Peaks at Gear 9.0 for 8 minutes for reciprocal movement patterns, incr amplitude of stepping, neural priming. When turning to sit on chair, pt did not fully turn and plopped down w/feet twisted. Informed pt this is not safe, pt unaware that he did this. Pt reporting soreness as 5/10 afterwards.  The following were added to HEP (see bolded below) for improved spinal mobility, postural control and pain modulation:  Quadruped cat cows, x14 reps. Pt demonstrates almost absent lumbar lordosis w/movement.  Sidelying open books, x15 reps per side w/min A to stabilize pelvis. Educated wife on how to do this at home or use pillows to prevent rotation of hips. Pt w/decreased mobility rotating to L side > R    NMR  At rebounder, attempted to stand w/side facing rebounder w/alt fwd step and rotation to thow/catch 2kg ball, but pt unable to perform without max A due to instability. Regressed to alt fwd step w/ball throw/catch while facing rebounder, x8 reps per side, for improved step length/clearance, anticipatory balance strategies, lateral weight shifting, postural control and retro stepping. Pt required CGA when stepping w/LLE and min-mod A when stepping w/RLE. Noted significant forward flexed posture when stepping w/RLE but improved posture when stepping w/LLE. Pt did improve w/cues to maintain upright posture when stepping w/RLE but reported pain in low back w/this, so halted activity  Seated deadlifts with black resistance band, x20 reps, for improved posterior chain strength and postural control.  Mod verbal cues for pt  to perform properly, as pt pulling band w/arms rather than posterior chain. Pt denied pain w/activity.  Lengthy discussion regarding importance of body mechanics w/movement, as pt tends to flex forward w/freezing or balance difficulties, making his balance worse. For example, when turning around mat tables in gym, pt began to freeze and rather than stop and reset, reached forward for a chair and pushed through the freeze. Pt unaware of himself doing this, but pt's wife verbalized agreement and pt reports it is "a lot to think about".   PATIENT EDUCATION: Education details: updates to HEP, see NMR above   Person educated: Patient and Spouse Education method: Explanation and Verbal cues Education comprehension: verbalized understanding, verbal cues required, and needs further education  HOME EXERCISE PROGRAM: Standing PWR! Moves with chair in front 2 x 10 reps 3x week   Access Code: [email or web address] URL: [email or web address] Date: 09/14/2023 Prepared by: Burleigh Carp Egidio Lofgren  Exercises - Cat Cow  - 1 x daily - 7 x weekly - 3 sets - 10 reps - Sidelying Open Book Thoracic Lumbar Rotation and Extension  - 1 x daily - 7 x weekly - 3 sets - 10 reps  GOALS: Goals reviewed with patient? Yes  SHORT TERM GOALS: Target date: 09/18/2023   Pt will be independent with initial HEP for improved strength, balance, transfers and gait.  Baseline: not established on eval  Goal status: INITIAL  2.  Pt will verbalize and demonstrate freezing strategies to implement at home and in community for improved mobility and reduced fall risk  Baseline:  Goal status: INITIAL  3.  MiniBest to be assessed and LTG updated  Baseline: assessed on 4/7 Goal status: MET   LONG TERM GOALS: Target date: 10/02/2023   Patient will improve miniBEST score to 20/28 to indicate progress towards a decreased risk of falls and improved dynamic and static stability.   Baseline: 16/28 Goal status: INITIAL  2.  Pt will  score </=  10/24 on FOGQ for improved freezing strategies and reduced fall risk  Baseline:  Goal status: INITIAL   ASSESSMENT:  CLINICAL IMPRESSION: Emphasis of skilled PT session on postural control, anticipatory balance strategies and spinal mobility. Pt continues to be limited by freezing and postural instability, demonstrating poor freezing strategies and stating it is "a lot to think about it". Pt demonstrates excessive forward flexed posture, especially when stepping with RLE, resulting in festination and strain on low back. Added some gentle mobility exercises to HEP to address low back pain, but pt unable to verbalize if they were helpful or not. Will continue per POC.   OBJECTIVE IMPAIRMENTS: Abnormal gait, decreased balance, decreased knowledge of use of DME, decreased mobility, difficulty walking, impaired sensation, postural dysfunction, pain, and dyskinesias     ACTIVITY LIMITATIONS: carrying, lifting, bending, standing, squatting, transfers, locomotion level, and caring for others  PARTICIPATION LIMITATIONS: medication management, driving, shopping, community activity, and yard work  PERSONAL FACTORS: Fitness, Past/current experiences, and 1 comorbidity: PD w/dyskinesias  are also affecting patient's functional outcome.   REHAB POTENTIAL: Good  CLINICAL DECISION MAKING: Evolving/moderate complexity  EVALUATION COMPLEXITY: Moderate  PLAN:  PT FREQUENCY: 2x/week  PT DURATION: 6 weeks  PLANNED INTERVENTIONS: 97164- PT Re-evaluation, 97110-Therapeutic exercises, 97530- Therapeutic activity, [id number]- Neuromuscular re-education, 97535- Self Care, 84132- Manual therapy, [phone]- Gait training, [phone]- Aquatic Therapy, Patient/Family education, Balance training, Dry Needling, Vestibular training, and DME instructions  PLAN FOR NEXT SESSION: review freezing strategies and turning, AD recommendations as appropriate (uses cane during day sometimes and rollator sometimes at night and no  AD other times), postural control   review PWR! Moves as needed, stepping strategy (all directions were challenging on miniBEST), work on balance with turns. Add balance to HEP as appropriate   Yaqueline Gutter E Adrian Dinovo, PT, DPT 09/14/2023, 10:04 AM

## 2023-09-17 ENCOUNTER — Ambulatory Visit: Admitting: Occupational Therapy

## 2023-09-17 ENCOUNTER — Ambulatory Visit: Admitting: Physical Therapy

## 2023-09-17 ENCOUNTER — Encounter: Payer: Self-pay | Admitting: Occupational Therapy

## 2023-09-17 DIAGNOSIS — G20B2 Parkinson's disease with dyskinesia, with fluctuations: Secondary | ICD-10-CM | POA: Diagnosis not present

## 2023-09-17 DIAGNOSIS — R29898 Other symptoms and signs involving the musculoskeletal system: Secondary | ICD-10-CM | POA: Diagnosis not present

## 2023-09-17 DIAGNOSIS — R1310 Dysphagia, unspecified: Secondary | ICD-10-CM | POA: Diagnosis not present

## 2023-09-17 DIAGNOSIS — R29818 Other symptoms and signs involving the nervous system: Secondary | ICD-10-CM | POA: Diagnosis not present

## 2023-09-17 DIAGNOSIS — R2681 Unsteadiness on feet: Secondary | ICD-10-CM

## 2023-09-17 DIAGNOSIS — R293 Abnormal posture: Secondary | ICD-10-CM

## 2023-09-17 DIAGNOSIS — R2689 Other abnormalities of gait and mobility: Secondary | ICD-10-CM

## 2023-09-17 DIAGNOSIS — R278 Other lack of coordination: Secondary | ICD-10-CM | POA: Diagnosis not present

## 2023-09-17 DIAGNOSIS — M5459 Other low back pain: Secondary | ICD-10-CM | POA: Diagnosis not present

## 2023-09-17 DIAGNOSIS — R471 Dysarthria and anarthria: Secondary | ICD-10-CM | POA: Diagnosis not present

## 2023-09-17 NOTE — Patient Instructions (Addendum)
 To DON jacket:   1) Wide stance 2) Hold facing tag out with hands big at sleeves 3) Swing around big like a cape to Rt side 4) Punch Rt arm in as you continue to pull behind you with Lt hand (around and BEHIND head) 5) Punch Lt arm in   To DOFF jacket:   1) Wide stance 2) Hold hands big at belly button level 3) Turn and twist to Lt to take off same shoulder 4) Turn and twist to Rt to take off same shoulder 5) Reach big behind you and grab cuff of sleeve BIG and yank off one hand, then the other                     Ways to prevent future Parkinson's related complications:  1.   Exercise regularly/daily. Challenge yourself SAFELY and try to get different types of exercise including: PWR! Moves, Cardio (cycling), strength and balance training, and stretches (Yoga)   2.   Focus on BIGGER movements during daily activities- really reach overhead, straighten elbows and extend fingers  3.   When dressing (especially jacket/coat) or reaching for your seatbelt make sure to use your body to assist by twisting while you reach and looking at where you are reaching - this can help to minimize stress on the shoulder and reduce the risk of a rotator cuff tear  4.   Swing your arms when you walk (unless using walker)! People with PD are at increased risk for frozen shoulder and swinging your arms can reduce this risk.  5. Drink plenty of water and eat a high fiber diet (fresh fruits and veggies, nuts/seeds, beans, some fish). Avoid canned foods, reduce sugar intake and dairy when possible  6. Do NOT take your Parkinson's medication around meals (take at least 30 min before a meal, or 1 hour after a meal), especially protein as it blocks the absorption of the medicine and will not work effectively  7. Keep your feet apart when you are standing (wider stance) to allow you to have better balance and to reach further with your arms. Also make sure your feet are apart before standing up.    8. Stay social! - Go out with friends, play card games with others, join a community group or community PD exercise class  9. Get a good night's sleep! Staying active during the day and developing a good bedtime routine can help with this.   10. Communicate with your neurologist any concerns and maintain overall health - keep blood pressure regulated, reduce stress/anxiety, good nutrition/gut health, etc.

## 2023-09-17 NOTE — Therapy (Signed)
 OUTPATIENT PHYSICAL THERAPY NEURO TREATMENT   Patient Name: Jack Cobb MRN: [id number] DOB:[birth year], 73 y.o., male Today's Date: 09/17/2023   PCP: Aloha Arnold, PA-C REFERRING PROVIDER: Debbra Fairy, MD  END OF SESSION:  PT End of Session - 09/17/23 0853     Visit Number 8    Number of Visits 13    Date for PT Re-Evaluation 10/16/23    Authorization Type UHC Medicare    Authorization Time Period 08/21/23 - 10/02/23 13 PT visits    PT Start Time 0850    PT Stop Time 0929    PT Time Calculation (min) 39 min    Equipment Utilized During Treatment --    Activity Tolerance Patient tolerated treatment well    Behavior During Therapy [facility] for tasks assessed/performed                 Past Medical History:  Diagnosis Date   CAD (coronary artery disease), native coronary artery 09/06/2020   Coronary CTA was done showing a coronary calcium  score of 201 placing him at the 60th percentile for age and gender.  He was noted to have 75 to 99% stenosis of the mid LAD with scattered nonobstructive plaquing elsewhere.  FFR 0.7 in D1 and 0.73 at the apex>>medical management   HLD (hyperlipidemia) 09/06/2020   Hypertension    Parkinson disease [facility])    Past Surgical History:  Procedure Laterality Date   BACK SURGERY     2019.2023, january 2024 lumbar surgeries with dr. pool   HERNIA REPAIR     ventral hernia repair with mesh x2   LUMBAR LAMINECTOMY/DECOMPRESSION MICRODISCECTOMY Left 09/19/2022   Procedure: Microdiscectomy - left - Lumbar Four-Lumbar Five;  Surgeon: Agustina Aldrich, MD;  Location: [facility] OR;  Service: Neurosurgery;  Laterality: Left;  3C   Patient Active Problem List   Diagnosis Date Noted   Recurrent herniation of lumbar disc 09/19/2022   CAD (coronary artery disease), native coronary artery 09/06/2020   HLD (hyperlipidemia) 09/06/2020   PAF (paroxysmal atrial fibrillation) (HCC) 06/13/2020   Essential hypertension 06/13/2020   Elevated troponin 06/13/2020    Parkinson's disease (HCC) 12/12/2012    ONSET DATE: 08/15/2023 (referral)  REFERRING DIAG: G20.B2 (ICD-10-CM) - Parkinson's disease with dyskinesia and fluctuating manifestations (HCC) G20.B1 (ICD-10-CM) - 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 States his back was really painful following last session, took tylenol  which helped a little. Did not work on his exercises much this weekend but was active outside working on his tractor. No falls.   Pt accompanied by: self and   Wife  PERTINENT HISTORY: hypertension, HLD, PAF, CAD, s/p hernia repair in 2012, and s/p multiple back surgeries. Diagnosed w/PD in 2007   PAIN:  Are you having pain?  No, just feeling sore form therapy the other day and working on his tractor   PRECAUTIONS: Fall  RED FLAGS: None   WEIGHT BEARING RESTRICTIONS: No  FALLS: Has patient fallen in last 6 months?  No, but several near misses   LIVING ENVIRONMENT: Lives with: lives with their spouse Lives in: House/apartment Stairs: Yes: Internal: 15 steps; on right going up and External: 15 steps; on right going up, on left going up, and can reach both Has following equipment at home: Single point cane and Walker - 2 wheeled  PLOF: Independent  PATIENT GOALS: " Stabilize"   OBJECTIVE:  Note: Objective measures were completed at Evaluation  unless otherwise noted.  DIAGNOSTIC FINDINGS: MRI of Lumbar spine from 08/2022  IMPRESSION: 1. New prominent marrow edema in the left sacral ala, suspicious for insufficiency fracture. 2. New left subarticular disc extrusion at L4-L5 potentially affecting the descending left L5 nerve root. 3. Interval posterior decompression at L5-S1 with improved now mild left lateral recess stenosis at L5-S1. 4. Unchanged severe bilateral neuroforaminal stenosis at L4-L5 and L5-S1.  COGNITION: Overall cognitive status: Within functional limits for tasks assessed   SENSATION: Pt reports frequent numbness of L foot, thinks due to history of low back surgeries.   COORDINATION: Heel to shin test: WNL bilaterally    POSTURE: rounded shoulders, forward head, and difficult to assess due to dyskinesias   LOWER EXTREMITY ROM:     Active  Right Eval Left Eval  Hip flexion    Hip extension    Hip abduction    Hip adduction    Hip internal rotation    Hip external rotation    Knee flexion    Knee extension    Ankle dorsiflexion    Ankle plantarflexion    Ankle inversion    Ankle eversion     (Blank rows = not tested)  LOWER EXTREMITY MMT:  Tested in seated position   MMT Right Eval Left Eval  Hip flexion 5 5  Hip extension    Hip abduction 5 5  Hip adduction 5 5  Hip internal rotation    Hip external rotation    Knee flexion 5 5  Knee extension 5 5  Ankle dorsiflexion 5 5  Ankle plantarflexion    Ankle inversion    Ankle eversion    (Blank rows = not tested)  BED MOBILITY:  Independent per pt   TRANSFERS: Assistive device utilized: None  Sit to stand: Modified independence Stand to sit: Modified independence Chair to chair: Modified independence   GAIT: Gait pattern: step through pattern, decreased stride length, shuffling, festinating, lateral hip instability, trunk flexed, and wide BOS Distance walked: Various clinic distances  Assistive device utilized: Single  point cane and None Level of assistance: SBA and CGA Comments: Significant freezing w/turns and walking into doorways. Pt w/forward flexed posture and fighting his freeze, requiring CGA to avoid anterior LOB.   PATIENT SURVEYS:  Freezing of gait questionnaire 14/24  TREATMENT     Therapeutic Activity:  Majority of session spent providing education regarding movement of the spine, importance of working on spinal extension and the role posture plays on balance. Pt demonstrates severe flexion preference w/poor tolerance to extension. Pt reports he does "stretch out" on the countertop occasionally and demonstrated for therapist. Pt essentially performing modified plank on countertop. Inquired about pt performing prone lying, but pt states his surgeon told him not to lay on his stomach after surgery several years ago, so has not tried it. Assessed pt's tolerance to prone lying and pt unable to prop up on elbows but could lie flat w/pillow under head. Provided grade III mobilizations to lumbar spine which pt reported relief with. Added prone lying to HEP (see bolded below) and encouraged pt to attempt to hold daily for at least 3-5 minutes.  Seated pallof presses with orange resistance band, x12 reps per side, for improved core stability and back pain modulation. Pt more challenged when band anchored to R side.    PATIENT EDUCATION: Education details: See above, updates to HEP  Person educated: Patient and Spouse Education method: Explanation, Demonstration, Verbal cues, and Handouts Education comprehension: verbalized understanding, returned demonstration, verbal cues required, and needs further education  HOME EXERCISE PROGRAM: Standing PWR! Moves with chair in front 2 x 10 reps 3x week   Access Code: [email or web address] URL: [email or web address] Date:  09/14/2023 Prepared by: Burleigh Carp Ingvald Theisen  Exercises - Cat Cow  - 1 x daily - 7 x weekly - 3 sets - 10 reps - Sidelying Open Book Thoracic Lumbar Rotation and Extension  - 1 x daily - 7 x weekly - 3 sets - 10 reps - Prone Press Up On Elbows  - 1 x daily - 7 x weekly - 3-5 minutes hold  GOALS: Goals reviewed with patient? Yes  SHORT TERM GOALS: Target date: 09/18/2023   Pt will be independent with initial HEP for improved strength, balance, transfers and gait.  Baseline: not established on eval  Goal status: INITIAL  2.  Pt will verbalize and demonstrate freezing strategies to implement at home and in community for improved mobility and reduced fall risk  Baseline:  Goal status: INITIAL  3.  MiniBest to be assessed and LTG updated  Baseline: assessed on 4/7 Goal status: MET   LONG TERM GOALS: Target date: 10/02/2023   Patient will improve miniBEST score to 20/28 to indicate progress towards a decreased risk of falls and improved dynamic and static stability.   Baseline: 16/28 Goal status: INITIAL  2.  Pt will score </= 10/24 on FOGQ for improved freezing strategies and reduced fall risk  Baseline:  Goal status: INITIAL   ASSESSMENT:  CLINICAL IMPRESSION: Emphasis of skilled PT session on pt education regarding importance of spinal mobility and postural control and updating HEP. Pt reports he was extremely sore in his back following last session and could not do his exercises, yet was able to work on his tractor. Attempted to provide extensive education on importance of mobility and working on spinal extension, as pt has severe forward flexed posture and sits w/PPT. Pt not receptive to education despite use of pictures and analogies, but was agreeable to try prone lying for at least 3-5 minutes pr day to work on gentle spinal extension. Will continue per POC.   OBJECTIVE IMPAIRMENTS: Abnormal gait, decreased balance, decreased knowledge of use of DME, decreased mobility,  difficulty walking, impaired sensation, postural dysfunction, pain, and dyskinesias     ACTIVITY LIMITATIONS:  carrying, lifting, bending, standing, squatting, transfers, locomotion level, and caring for others  PARTICIPATION LIMITATIONS: medication management, driving, shopping, community activity, and yard work  PERSONAL FACTORS: Fitness, Past/current experiences, and 1 comorbidity: PD w/dyskinesias  are also affecting patient's functional outcome.   REHAB POTENTIAL: Good  CLINICAL DECISION MAKING: Evolving/moderate complexity  EVALUATION COMPLEXITY: Moderate  PLAN:  PT FREQUENCY: 2x/week  PT DURATION: 6 weeks  PLANNED INTERVENTIONS: 97164- PT Re-evaluation, 97110-Therapeutic exercises, 97530- Therapeutic activity, [id number]- Neuromuscular re-education, 97535- Self Care, 54098- Manual therapy, [id number]- Gait training, [phone]- Aquatic Therapy, Patient/Family education, Balance training, Dry Needling, Vestibular training, and DME instructions  PLAN FOR NEXT SESSION: Has he been working on prone lying? Spinal extension and core stability, anterior pelvic tilt.  review freezing strategies and turning, AD recommendations as appropriate (uses cane during day sometimes and rollator sometimes at night and no AD other times), postural control   review PWR! Moves as needed, stepping strategy (all directions were challenging on miniBEST), work on balance with turns. Add balance to HEP as appropriate   Aimy Sweeting E Mallory Enriques, PT, DPT 09/17/2023, 9:30 AM

## 2023-09-17 NOTE — Therapy (Signed)
 OUTPATIENT OCCUPATIONAL THERAPY PARKINSON'S TREATMENT  Patient Name: Jack Cobb MRN: [id number] DOB:1950-08-10, 73 y.o., male Today's Date: 09/17/2023  PCP: Aloha Arnold, PA-C  REFERRING PROVIDER: Debbra Fairy, MD  END OF SESSION:  OT End of Session - 09/17/23 0803     Visit Number 5    Number of Visits 9    Date for OT Re-Evaluation 09/26/23    Authorization Type [employer] Medicare - [employer] req    OT Start Time 0801    OT Stop Time 0845    OT Time Calculation (min) 44 min    Activity Tolerance Patient tolerated treatment well    Behavior During Therapy [facility] for tasks assessed/performed             Past Medical History:  Diagnosis Date   CAD (coronary artery disease), native coronary artery 09/06/2020   Coronary CTA was done showing a coronary calcium  score of 201 placing him at the 60th percentile for age and gender.  He was noted to have 75 to 99% stenosis of the mid LAD with scattered nonobstructive plaquing elsewhere.  FFR 0.7 in D1 and 0.73 at the apex>>medical management   HLD (hyperlipidemia) 09/06/2020   Hypertension    Parkinson disease [facility])    Past Surgical History:  Procedure Laterality Date   BACK SURGERY     2019.2023, january 2024 lumbar surgeries with dr. pool   HERNIA REPAIR     ventral hernia repair with mesh x2   LUMBAR LAMINECTOMY/DECOMPRESSION MICRODISCECTOMY Left 09/19/2022   Procedure: Microdiscectomy - left - Lumbar Four-Lumbar Five;  Surgeon: Agustina Aldrich, MD;  Location: [facility] OR;  Service: Neurosurgery;  Laterality: Left;  3C   Patient Active Problem List   Diagnosis Date Noted   Recurrent herniation of lumbar disc 09/19/2022   CAD (coronary artery disease), native coronary artery 09/06/2020   HLD (hyperlipidemia) 09/06/2020   PAF (paroxysmal atrial fibrillation) (HCC) 06/13/2020   Essential hypertension 06/13/2020   Elevated troponin 06/13/2020   Parkinson's disease (HCC) 12/12/2012    ONSET DATE: 08/15/2023 (Date of referral); has been dx  with PD since 2007  REFERRING DIAG:  G20.B1 (ICD-10-CM) - Parkinson's disease with dyskinesia, without mention of fluctuations  G20.B2 (ICD-10-CM) - Parkinson's disease with dyskinesia, with fluctuations  K59.00 (ICD-10-CM) - Constipation, unspecified  R29.818 (ICD-10-CM) - Other symptoms and signs involving the nervous system  R29.898 (ICD-10-CM) - Other symptoms and signs involving the musculoskeletal system  I49.9 (ICD-10-CM) - Cardiac arrhythmia, unspecified  R29.3 (ICD-10-CM) - Abnormal posture   THERAPY DIAG:  Unsteadiness on feet  Abnormal posture  Other lack of coordination  Other symptoms and signs involving the nervous system  Other symptoms and signs involving the musculoskeletal system  Rationale for Evaluation and Treatment: Rehabilitation  SUBJECTIVE:   SUBJECTIVE STATEMENT: No new falls. His low back pain increases during the off time of his medication.   Pt accompanied by: significant other   PERTINENT HISTORY: PMH - hypertension, HLD, PAF, CAD, s/p hernia repair in 2012, and s/p multiple back surgeries   PRECAUTIONS: Fall  WEIGHT BEARING RESTRICTIONS: No  PAIN:  Are you having pain? Yes: NPRS scale: 3/10 Pain location: low back Pain description: stiff; sore Aggravating factors: medication timing Relieving factors: medications; CBD cream  FALLS: Has patient fallen in last 6 months? No  LIVING ENVIRONMENT: Lives with: lives with their spouse Lives in: House/apartment Stairs: Yes: Internal: 15 steps; can reach both and External: 15 steps; can reach both Has following equipment at home: Single point cane  and Walker - 2 wheeled  PLOF: Independent; retired [employer]; not driving for the last year  PATIENT GOALS: maintain current level of function  OBJECTIVE:  Note: Objective measures were completed at Evaluation unless otherwise noted.  HAND DOMINANCE: Right  ADLs: Overall ADLs: mod I  IADLs: Light housekeeping: mod I Meal Prep:  mod I Community mobility: dependent Medication management: setup Financial management: mod I Handwriting: 50% legible and Severe micrographia  MOBILITY STATUS: Independent - uses cane  POSTURE COMMENTS:  rounded shoulders and forward head  ACTIVITY TOLERANCE: Activity tolerance: good  FUNCTIONAL OUTCOME MEASURES: Fastening/unfastening 3 buttons: 43 seconds Physical performance test: PPT#2 (simulated eating) 12 seconds & PPT#4 (donning/doffing jacket): 10 seconds  COORDINATION: 9 Hole Peg test: Right: 31 sec; Left: 29 sec Box and Blocks:  Right 47 blocks, Left 50 blocks Tremors: Resting, action, Right, Left, and head  UE ROM:  WFL  UE MMT:   WFL  SENSATION: WFL  MUSCLE TONE: WFL  COGNITION: Overall cognitive status: Within functional limits for tasks assessed  OBSERVATIONS: Bradykinesia, Dyskinesias, and Postural tremors                                                                                                                   TREATMENT:   Pt shown cape method (see pt instructions for details) for jacket and practiced multiple times d/t difficulty with new method. Pt w/ better success the more he practiced. Recommended practicing at home daily (even during hot days)  Pt issued ways to prevent future related PD complications and reviewed with pt/wife. See pt instructions for details. Discussed avoiding protein when taking PD meds as this may be decreasing effectiveness and increasing "off" times b/t medication times  Reviewed PWR! Seated w/ modifications needed for PWR! Rock (d/t neck pain looking up to Lt) and instructed pt to stop PWR! Twist seated if back pain increases. Pt instructed to continue PWR! Twist standing however as this is easier to modify prn with back pain.   When asked, pt has been on carbidopa -levodopa  for 18 years and taking multiple times per day - pt demo writhing dyskinetic movements probably d/t duration/length and frequency of being on meds.  Pt/wife reports being sent to movement disorder specialist in [place]    PATIENT EDUCATION: Education details: see above Person educated: Patient and Spouse Education method: Explanation, Demonstration, Verbal cues, and Handouts Education comprehension: verbalized understanding, returned demonstration, verbal cues required, and needs further education  HOME EXERCISE PROGRAM: 08/27/23: [place]! Hands, handwriting strategies 09/11/23: coordination HEP, big ADL strategies 09/14/2023: PWR! Sitting 09/17/2023: Cape method, ways to prevent PD complications, POP meeting info  GOALS:  SHORT TERM GOALS: Target date: 09/18/2023    Pt will be independent with PD specific HEP.  Baseline: not yet initiated Goal status: MET  2.  Pt will verbalize understanding of adapted strategies to maximize safety and independence with ADLs/IADLs.  Baseline: not yet initiated Goal status: MET   LONG TERM GOALS: Target date: 09/26/2023    Pt will verbalize  understanding of ways to prevent future PD related complications and PD community resources.  Baseline: not yet initiated Goal status: IN PROGRESS (still needs community resources)  2.  Pt will write a short paragraph with no significant decrease in size and maintain 100% legibility.  Baseline: 50% legibility with moderate micrographia Goal status: IN PROGRESS  3.  Pt will verbalize understanding of ways to keep thinking skills sharp and ways to compensate for STM changes in the future.  Baseline: not yet initiated Goal status: INITIAL  4.  Pt will demonstrate improved ease with fastening buttons as evidenced by decreasing 3 button/unbutton time to 35 seconds or less.  Baseline: 43 seconds Goal status: INITIAL  5.  Pt will be able to place at least 50 blocks using R hand with completion of Box and Blocks test.  Baseline: 47 blocks Goal status: INITIAL   ASSESSMENT:  CLINICAL IMPRESSION: Patient demonstrates good understanding of HEP though will  require repeat education for carryover. Pt progressing towards goals PERFORMANCE DEFICITS: in functional skills including ADLs, IADLs, coordination, Fine motor control, Gross motor control, and UE functional use.   IMPAIRMENTS: are limiting patient from ADLs, IADLs, and leisure.   COMORBIDITIES:  may have co-morbidities  that affects occupational performance. Patient will benefit from skilled OT to address above impairments and improve overall function.  REHAB POTENTIAL: Fair given PD prognosis  PLAN:  OT FREQUENCY: 2x/week  OT DURATION: 4 weeks (extended to 9 total visits due to scheduling issues)  PLANNED INTERVENTIONS: 97168 OT Re-evaluation, 97535 self care/ADL training, 84132 therapeutic exercise, 97530 therapeutic activity, 97112 neuromuscular re-education, functional mobility training, and patient/family education  RECOMMENDED OTHER SERVICES: Recommend ST referral for evaluation and treatment of cognition and speech.   CONSULTED AND AGREED WITH PLAN OF CARE: Patient and family member/caregiver  PLAN FOR NEXT SESSION: issue and review community resources, issue memory strategies and ways to keep thinking skills sharp, practice handwriting and buttons and check progress towards these goals   Velinda Getting, OT 09/17/2023, 8:03 AM

## 2023-09-19 ENCOUNTER — Encounter: Admitting: Occupational Therapy

## 2023-09-19 ENCOUNTER — Ambulatory Visit: Admitting: Physical Therapy

## 2023-09-24 ENCOUNTER — Encounter: Payer: Self-pay | Admitting: Occupational Therapy

## 2023-09-24 ENCOUNTER — Ambulatory Visit: Admitting: Occupational Therapy

## 2023-09-24 ENCOUNTER — Encounter: Payer: Self-pay | Admitting: Physical Therapy

## 2023-09-24 ENCOUNTER — Ambulatory Visit: Attending: Neurology | Admitting: Physical Therapy

## 2023-09-24 DIAGNOSIS — R29898 Other symptoms and signs involving the musculoskeletal system: Secondary | ICD-10-CM

## 2023-09-24 DIAGNOSIS — R471 Dysarthria and anarthria: Secondary | ICD-10-CM | POA: Insufficient documentation

## 2023-09-24 DIAGNOSIS — M5459 Other low back pain: Secondary | ICD-10-CM | POA: Diagnosis not present

## 2023-09-24 DIAGNOSIS — R293 Abnormal posture: Secondary | ICD-10-CM

## 2023-09-24 DIAGNOSIS — R41841 Cognitive communication deficit: Secondary | ICD-10-CM | POA: Diagnosis present

## 2023-09-24 DIAGNOSIS — R29818 Other symptoms and signs involving the nervous system: Secondary | ICD-10-CM | POA: Insufficient documentation

## 2023-09-24 DIAGNOSIS — R2681 Unsteadiness on feet: Secondary | ICD-10-CM | POA: Insufficient documentation

## 2023-09-24 DIAGNOSIS — R2689 Other abnormalities of gait and mobility: Secondary | ICD-10-CM | POA: Diagnosis not present

## 2023-09-24 DIAGNOSIS — R278 Other lack of coordination: Secondary | ICD-10-CM

## 2023-09-24 DIAGNOSIS — R1310 Dysphagia, unspecified: Secondary | ICD-10-CM | POA: Diagnosis not present

## 2023-09-24 NOTE — Therapy (Signed)
 OUTPATIENT PHYSICAL THERAPY NEURO TREATMENT   Patient Name: Jack SWEEZEY MRN: [id number] DOB:02/07/1951, 73 y.o., male Today's Date: 09/24/2023   PCP: Aloha Arnold, PA-C REFERRING PROVIDER: Debbra Fairy, MD  END OF SESSION:  PT End of Session - 09/24/23 0848     Visit Number 9    Number of Visits 13    Date for PT Re-Evaluation 10/16/23    Authorization Type UHC Medicare    Authorization Time Period 08/21/23 - 10/02/23 13 PT visits    PT Start Time 0846    PT Stop Time 0927    PT Time Calculation (min) 41 min    Equipment Utilized During Treatment Gait belt    Activity Tolerance Patient tolerated treatment well    Behavior During Therapy [facility] for tasks assessed/performed                 Past Medical History:  Diagnosis Date   CAD (coronary artery disease), native coronary artery 09/06/2020   Coronary CTA was done showing a coronary calcium  score of 201 placing him at the 60th percentile for age and gender.  He was noted to have 75 to 99% stenosis of the mid LAD with scattered nonobstructive plaquing elsewhere.  FFR 0.7 in D1 and 0.73 at the apex>>medical management   HLD (hyperlipidemia) 09/06/2020   Hypertension    Parkinson disease [facility])    Past Surgical History:  Procedure Laterality Date   BACK SURGERY     2019.2023, january 2024 lumbar surgeries with dr. pool   HERNIA REPAIR     ventral hernia repair with mesh x2   LUMBAR LAMINECTOMY/DECOMPRESSION MICRODISCECTOMY Left 09/19/2022   Procedure: Microdiscectomy - left - Lumbar Four-Lumbar Five;  Surgeon: Agustina Aldrich, MD;  Location: [facility] OR;  Service: Neurosurgery;  Laterality: Left;  3C   Patient Active Problem List   Diagnosis Date Noted   Recurrent herniation of lumbar disc 09/19/2022   CAD (coronary artery disease), native coronary artery 09/06/2020   HLD (hyperlipidemia) 09/06/2020   PAF (paroxysmal atrial fibrillation) (HCC) 06/13/2020   Essential hypertension 06/13/2020   Elevated troponin 06/13/2020    Parkinson's disease (HCC) 12/12/2012    ONSET DATE: 08/15/2023 (referral)  REFERRING DIAG: G20.B2 (ICD-10-CM) - Parkinson's disease with dyskinesia and fluctuating manifestations (HCC) G20.B1 (ICD-10-CM) - 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 Was sitting on a low bucket when painting a fence. Got up and fell backwards. Did not hurt himself, just his pride. Likes the exercises when he is laying on his stomach. Reports it feels good like he is getting a good stretch. Freezing episodes have not changed much yet.   Pt accompanied by: self and   Wife  PERTINENT HISTORY: hypertension, HLD, PAF, CAD, s/p hernia repair in 2012, and s/p multiple back surgeries. Diagnosed w/PD in 2007   PAIN:  Are you having pain?  No   PRECAUTIONS: Fall  RED FLAGS: None   WEIGHT BEARING RESTRICTIONS: No  FALLS: Has patient fallen in last 6 months?  No, but several near misses   LIVING ENVIRONMENT: Lives with: lives with their spouse Lives in: House/apartment Stairs: Yes: Internal: 15 steps; on right going up and External: 15 steps; on right going up, on left going up, and can reach both Has following equipment at home: Single point cane and Walker - 2 wheeled  PLOF: Independent  PATIENT GOALS: " Stabilize"   OBJECTIVE:  Note: Objective measures were completed at Evaluation  unless otherwise noted.  DIAGNOSTIC FINDINGS: MRI of Lumbar spine from 08/2022  IMPRESSION: 1. New prominent marrow edema in the left sacral ala, suspicious for insufficiency fracture. 2. New left subarticular disc extrusion at L4-L5 potentially affecting the descending left L5 nerve root. 3. Interval posterior decompression at L5-S1 with improved now mild left lateral recess stenosis at L5-S1. 4. Unchanged severe bilateral neuroforaminal stenosis at L4-L5 and L5-S1.  COGNITION: Overall cognitive status: Within functional limits for tasks assessed   SENSATION: Pt reports frequent numbness of L foot, thinks due to history of low back surgeries.   COORDINATION: Heel to shin test: WNL bilaterally    POSTURE: rounded shoulders, forward head, and difficult to assess due to dyskinesias   LOWER EXTREMITY ROM:     Active  Right Eval Left Eval  Hip flexion    Hip extension    Hip abduction    Hip adduction    Hip internal rotation    Hip external rotation    Knee flexion    Knee extension    Ankle dorsiflexion    Ankle plantarflexion    Ankle inversion    Ankle eversion     (Blank rows = not tested)  LOWER EXTREMITY MMT:  Tested in seated position   MMT Right Eval Left Eval  Hip flexion 5 5  Hip extension    Hip abduction 5 5  Hip adduction 5 5  Hip internal rotation    Hip external rotation    Knee flexion 5 5  Knee extension 5 5  Ankle dorsiflexion 5 5  Ankle plantarflexion    Ankle inversion    Ankle eversion    (Blank rows = not tested)  BED MOBILITY:  Independent per pt   TRANSFERS: Assistive device utilized: None  Sit to stand: Modified independence Stand to sit: Modified independence Chair to chair: Modified independence   GAIT: Gait pattern: step through pattern, decreased stride length, shuffling, festinating, lateral hip instability, trunk flexed, and wide BOS Distance walked: Various clinic distances  Assistive device utilized: Single  point cane and None Level of assistance: SBA and CGA Comments: Significant freezing w/turns and walking into doorways. Pt w/forward flexed posture and fighting his freeze, requiring CGA to avoid anterior LOB.   PATIENT SURVEYS:  Freezing of gait questionnaire 14/24  TREATMENT     Therapeutic Exercise:  SciFit with BUE/BLE Multi-Peaks at Gear 8.0 for 8 minutes for reciprocal movement patterns, incr amplitude of stepping, neural priming. When turning to sit down, cued to turn all the way around and pick up his feet before sitting on SciFit chair. Pt reporting RPE of 7/10 when going up the hills   NMR:  In prone position to work on spinal mobility and posture:  Prone PWR Up 2 x 10 reps with 3-5 second holds, pt reporting no pain when performing this, just incr stiffness, added to pt's HEP  Prone PWR Rock 2 x 10 reps (staying on forearms and reaching across body) In quadruped position to work on spinal mobility, core strengthening, and posture:  Alternating UE lifts 10 reps, alternating BLE lifts 10 reps, needs verbal/tactile cues for proper alignment when performing  2 x 10 reps bird dogs with CGA, pt more challenged when shifting weight to his L side   Pt performs PWR! Moves in quadruped position:  PWR! Up for improved posture 2 x 10 reps, pt reporting RPE as 7/10, cues for full hip extension and scap retraction, pt able to demo improved hip extension with this   Attempted seated anterior/posterior pelvic tilts 10 reps at edge of mat table, pt with limited mobility into anterior pelvic tilt   Performed 10 reps sit <> stands with incr forward lean and hip and trunk extension in standing and holding for a few seconds before sitting back down   PATIENT EDUCATION: Education details: Continue HEP, with prone lying can add Prone PWR Up with coming onto forearms (pt  reporting he did not need a photo with these), continued education on the role posture plays with balance  Person educated: Patient and Spouse Education method: Explanation, Demonstration, and Verbal cues Education comprehension: verbalized understanding, returned demonstration, verbal cues required, and needs further education  HOME EXERCISE PROGRAM: Standing PWR! Moves with chair in front 2 x 10 reps 3x week   Access Code: [email or web address] URL: [email or web address] Date: 09/14/2023 Prepared by: Burleigh Carp Plaster  Exercises - Cat Cow  - 1 x daily - 7 x weekly - 3 sets - 10 reps - Sidelying Open Book Thoracic Lumbar Rotation and Extension  - 1 x daily - 7 x weekly - 3 sets - 10 reps - Prone Press Up On Elbows  - 1 x daily - 7 x weekly - 3-5 minutes hold  GOALS: Goals reviewed with patient? Yes  SHORT TERM GOALS: Target date: 09/18/2023   Pt will be independent with initial HEP for improved strength, balance, transfers and gait.  Baseline: not established on eval  Goal status: INITIAL  2.  Pt will verbalize and demonstrate freezing strategies to implement at home and in community for improved mobility and reduced fall risk  Baseline:  Goal status: INITIAL  3.  MiniBest to be assessed and LTG updated  Baseline: assessed on 4/7 Goal status: MET   LONG TERM GOALS: Target date: 10/02/2023   Patient will improve miniBEST score to 20/28 to indicate progress towards a decreased risk of falls and improved dynamic and static stability.   Baseline: 16/28 Goal status: INITIAL  2.  Pt will score </= 10/24 on FOGQ for improved freezing strategies and reduced fall risk  Baseline:  Goal status: INITIAL   ASSESSMENT:  CLINICAL IMPRESSION: Today's skilled session focused on SciFit for larger amplitude movements/neural priming and quadruped/prone position exercises for spinal mobility/core strengthening/and posture. Pt able to tolerate prone position well  and able to perform some  PWR moves in prone when propped up on his forearms. Verbally added prone PWR Up to HEP (pt did not need photo) to continue to work on spinal extension. Pt challenged by bird dogs for postural control and needing CGA for balance with pt having more difficulty with shifting weight to L side. Pt with no pain during session, just reporting stiffness. Will continue per POC.   OBJECTIVE IMPAIRMENTS: Abnormal gait, decreased balance, decreased knowledge of use of DME, decreased mobility, difficulty walking, impaired sensation, postural dysfunction, pain, and dyskinesias     ACTIVITY LIMITATIONS: carrying, lifting, bending, standing, squatting, transfers, locomotion level, and caring for others  PARTICIPATION LIMITATIONS: medication management, driving, shopping, community activity, and yard work  PERSONAL FACTORS: Fitness, Past/current experiences, and 1 comorbidity: PD w/dyskinesias  are also affecting patient's functional outcome.   REHAB POTENTIAL: Good  CLINICAL DECISION MAKING: Evolving/moderate complexity  EVALUATION COMPLEXITY: Moderate  PLAN:  PT FREQUENCY: 2x/week  PT DURATION: 6 weeks  PLANNED INTERVENTIONS: 97164- PT Re-evaluation, 97110-Therapeutic exercises, 97530- Therapeutic activity, [id number]- Neuromuscular re-education, 97535- Self Care, 91478- Manual therapy, [id number]- Gait training, [phone]- Aquatic Therapy, Patient/Family education, Balance training, Dry Needling, Vestibular training, and DME instructions  PLAN FOR NEXT SESSION: Will need 10th visit PN. Spinal extension and core stability, anterior pelvic tilt.  review freezing strategies and turning, AD recommendations as appropriate (uses cane during day sometimes and rollator sometimes at night and no AD other times), postural control   review PWR! Moves as needed, stepping strategy (all directions were challenging on miniBEST), work on balance with turns. Add balance to HEP as appropriate   [place]ron [place], PT, DPT 09/24/2023,  9:37 AM

## 2023-09-24 NOTE — Patient Instructions (Signed)
 Memory Compensation Strategies  Use "WARM" strategy. W= write it down A=  associate it R=  repeat it M=  make a mental picture  You can keep a Memory Notebook. Use a 3-ring notebook with sections for the following:  calendar, important names and phone numbers, medications, doctors' names/phone numbers, "to do list"/reminders, and a section to journal what you did each day  Use a calendar to write appointments down.  Write yourself a schedule for the day.  This can be placed on the calendar or in a separate section of the Memory Notebook.  Keeping a regular schedule can help memory.  Use medication organizer with sections for each day or morning/evening pills  You may need help loading it  Keep a basket, or pegboard by the door.   Place items that you need to take out with you in the basket or on the pegboard.  You may also want to include a message board for reminders.  Use sticky notes. Place sticky notes with reminders in a place where the task is performed.  For example:  "turn off the stove" placed by the stove, "lock the door" placed on the door at eye level, "take your medications" on the bathroom mirror or by the place where you normally take your medications  Use alarms, timers, and/or a reminder app. Use while cooking to remind yourself to check on food or as a reminder to take your medicine, or as a reminder to make a call, or as a reminder to perform another task, etc.  Use a voice recorder app or small tape recorder to record important information and notes for yourself. Go back at the end of the day and listen to these.  Keeping Thinking Skills Sharp:  1. Jigsaw puzzles  2. Card/board games  3. Talking on the phone/social events  4. [email or web address]  5. Online games  6. Word serches/crossword puzzles  7.  Logic puzzles  8. Aerobic exercise (stationary bike)  9. Eating balanced diet (fruits & veggies)  10. Drink water  11. Try something new--new recipe,  hobby  12. Crafts  13. Do a variety of activities that are challenging  14.  Plan weekly meals and write a grocery list  15. Add cognitive activities to walking/exercising (think of animal/food/city with each letter of the alphabet, counting backwards, thinking of as many vegetables as you can, etc.).--Only do this  If safe (no freezing/falls).

## 2023-09-24 NOTE — Therapy (Signed)
 OUTPATIENT OCCUPATIONAL THERAPY PARKINSON'S TREATMENT  Patient Name: Jack Cobb MRN: [id number] DOB:[birth year], 73 y.o., male Today's Date: 09/24/2023  PCP: Aloha Arnold, PA-C  REFERRING PROVIDER: Debbra Fairy, MD  END OF SESSION:  OT End of Session - 09/24/23 0805     Visit Number 6    Number of Visits 9    Date for OT Re-Evaluation 09/26/23    Authorization Type [employer] Medicare - [employer] req    OT Start Time 0802    OT Stop Time 0845    OT Time Calculation (min) 43 min    Activity Tolerance Patient tolerated treatment well    Behavior During Therapy [facility] for tasks assessed/performed             Past Medical History:  Diagnosis Date   CAD (coronary artery disease), native coronary artery 09/06/2020   Coronary CTA was done showing a coronary calcium  score of 201 placing him at the 60th percentile for age and gender.  He was noted to have 75 to 99% stenosis of the mid LAD with scattered nonobstructive plaquing elsewhere.  FFR 0.7 in D1 and 0.73 at the apex>>medical management   HLD (hyperlipidemia) 09/06/2020   Hypertension    Parkinson disease [facility])    Past Surgical History:  Procedure Laterality Date   BACK SURGERY     2019.2023, january 2024 lumbar surgeries with dr. pool   HERNIA REPAIR     ventral hernia repair with mesh x2   LUMBAR LAMINECTOMY/DECOMPRESSION MICRODISCECTOMY Left 09/19/2022   Procedure: Microdiscectomy - left - Lumbar Four-Lumbar Five;  Surgeon: Agustina Aldrich, MD;  Location: [facility] OR;  Service: Neurosurgery;  Laterality: Left;  3C   Patient Active Problem List   Diagnosis Date Noted   Recurrent herniation of lumbar disc 09/19/2022   CAD (coronary artery disease), native coronary artery 09/06/2020   HLD (hyperlipidemia) 09/06/2020   PAF (paroxysmal atrial fibrillation) (HCC) 06/13/2020   Essential hypertension 06/13/2020   Elevated troponin 06/13/2020   Parkinson's disease (HCC) 12/12/2012    ONSET DATE: 08/15/2023 (Date of referral); has been dx  with PD since 2007  REFERRING DIAG:  G20.B1 (ICD-10-CM) - Parkinson's disease with dyskinesia, without mention of fluctuations  G20.B2 (ICD-10-CM) - Parkinson's disease with dyskinesia, with fluctuations  K59.00 (ICD-10-CM) - Constipation, unspecified  R29.818 (ICD-10-CM) - Other symptoms and signs involving the nervous system  R29.898 (ICD-10-CM) - Other symptoms and signs involving the musculoskeletal system  I49.9 (ICD-10-CM) - Cardiac arrhythmia, unspecified  R29.3 (ICD-10-CM) - Abnormal posture   THERAPY DIAG:  Unsteadiness on feet  Abnormal posture  Other lack of coordination  Other symptoms and signs involving the nervous system  Other symptoms and signs involving the musculoskeletal system  Rationale for Evaluation and Treatment: Rehabilitation  SUBJECTIVE:   SUBJECTIVE STATEMENT: I fell last Wednesday when I was painting (standing up from sitting on a bucket) but no injuries, and no pain today  Pt accompanied by: significant other   PERTINENT HISTORY: PMH - hypertension, HLD, PAF, CAD, s/p hernia repair in 2012, and s/p multiple back surgeries   PRECAUTIONS: Fall  WEIGHT BEARING RESTRICTIONS: No  PAIN:  Are you having pain? Yes: NPRS scale: 3/10 Pain location: low back Pain description: stiff; sore Aggravating factors: medication timing Relieving factors: medications; CBD cream  FALLS: Has patient fallen in last 6 months? 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  HAND DOMINANCE: Right  ADLs: Overall ADLs: mod  I  IADLs: Light housekeeping: mod I Meal Prep: mod I Community mobility: dependent Medication management: setup Financial management: mod I Handwriting: 50% legible and Severe micrographia  MOBILITY STATUS: Independent - uses cane  POSTURE COMMENTS:  rounded shoulders and forward head  ACTIVITY TOLERANCE: Activity tolerance: good  FUNCTIONAL OUTCOME MEASURES: Fastening/unfastening 3 buttons: 43 seconds Physical performance test: PPT#2 (simulated eating) 12 seconds & PPT#4 (donning/doffing jacket): 10 seconds  COORDINATION: 9 Hole Peg test: Right: 31 sec; Left: 29 sec Box and Blocks:  Right 47 blocks, Left 50 blocks Tremors: Resting, action, Right, Left, and head  UE ROM:  WFL  UE MMT:   WFL  SENSATION: WFL  MUSCLE TONE: WFL  COGNITION: Overall cognitive status: Within functional limits for tasks assessed  OBSERVATIONS: Bradykinesia, Dyskinesias, and Postural tremors                                                                                                                   TREATMENT:   Pt issued community and online PD resources and reviewed.   Pt issued ways to keep thinking skills sharp and memory strategies and reviewed how to practically implement.   Discussed remaining goals and to continue practicing handwriting and buttons. Pt reports handwriting is better    PATIENT EDUCATION: Education details: see above Person educated: Patient and Spouse Education method: Explanation, Demonstration, Verbal cues, and Handouts Education comprehension: verbalized understanding, returned demonstration, verbal cues required, and needs further education  HOME EXERCISE PROGRAM: 08/27/23: PWR! Hands, handwriting strategies 09/11/23: coordination HEP, big ADL strategies 09/14/2023: PWR! Sitting 09/17/2023: [place] method, ways to prevent PD complications, POP meeting info 09/24/23: community and online PD resources, memory strategies and ways to keep thinking skills  sharp  GOALS:  SHORT TERM GOALS: Target date: 09/18/2023    Pt will be independent with PD specific HEP.  Baseline: not yet initiated Goal status: MET  2.  Pt will verbalize understanding of adapted strategies to maximize safety and independence with ADLs/IADLs.  Baseline: not yet initiated Goal status: MET   LONG TERM GOALS: Target date: 09/26/2023    Pt will verbalize understanding of ways to prevent future PD related complications and PD community resources.  Baseline: not yet initiated Goal status: MET  2.  Pt will write a short paragraph with no significant decrease in size and maintain 100% legibility.  Baseline: 50% legibility with moderate micrographia Goal status: IN PROGRESS  3.  Pt will verbalize understanding of ways to keep thinking skills sharp and ways to compensate for STM changes in the future.  Baseline: not yet initiated Goal status: MET   4.  Pt will demonstrate improved ease with fastening buttons as evidenced by decreasing 3  button/unbutton time to 35 seconds or less.  Baseline: 43 seconds Goal status: IN PROGRESS  5.  Pt will be able to place at least 50 blocks using R hand with completion of Box and Blocks test.  Baseline: 47 blocks Goal status: INITIAL   ASSESSMENT:  CLINICAL IMPRESSION: Patient has met all STG's and some LTG's at this time. Pt with dyskinesias and pt/family reports still trying to work out medication dosage - sees movement disorder specialist soon PERFORMANCE DEFICITS: in functional skills including ADLs, IADLs, coordination, Fine motor control, Gross motor control, and UE functional use.   IMPAIRMENTS: are limiting patient from ADLs, IADLs, and leisure.   COMORBIDITIES:  may have co-morbidities  that affects occupational performance. Patient will benefit from skilled OT to address above impairments and improve overall function.  REHAB POTENTIAL: Fair given PD prognosis  PLAN:  OT FREQUENCY: 2x/week  OT DURATION: 4  weeks (extended to 9 total visits due to scheduling issues)  PLANNED INTERVENTIONS: 97168 OT Re-evaluation, 97535 self care/ADL training, 16109 therapeutic exercise, 97530 therapeutic activity, 97112 neuromuscular re-education, functional mobility training, and patient/family education  RECOMMENDED OTHER SERVICES: Recommend ST referral for evaluation and treatment of cognition and speech.   CONSULTED AND AGREED WITH PLAN OF CARE: Patient and family member/caregiver  PLAN FOR NEXT SESSION: assess remaining goals and d/c next session   Velinda Getting, OT 09/24/2023, 8:06 AM

## 2023-09-26 ENCOUNTER — Ambulatory Visit: Admitting: Physical Therapy

## 2023-09-26 ENCOUNTER — Ambulatory Visit: Admitting: Occupational Therapy

## 2023-09-26 ENCOUNTER — Encounter: Payer: Self-pay | Admitting: Occupational Therapy

## 2023-09-26 DIAGNOSIS — R2681 Unsteadiness on feet: Secondary | ICD-10-CM

## 2023-09-26 DIAGNOSIS — R29818 Other symptoms and signs involving the nervous system: Secondary | ICD-10-CM

## 2023-09-26 DIAGNOSIS — M5459 Other low back pain: Secondary | ICD-10-CM | POA: Diagnosis not present

## 2023-09-26 DIAGNOSIS — R293 Abnormal posture: Secondary | ICD-10-CM

## 2023-09-26 DIAGNOSIS — R471 Dysarthria and anarthria: Secondary | ICD-10-CM | POA: Diagnosis not present

## 2023-09-26 DIAGNOSIS — R1310 Dysphagia, unspecified: Secondary | ICD-10-CM | POA: Diagnosis not present

## 2023-09-26 DIAGNOSIS — R278 Other lack of coordination: Secondary | ICD-10-CM

## 2023-09-26 DIAGNOSIS — R29898 Other symptoms and signs involving the musculoskeletal system: Secondary | ICD-10-CM

## 2023-09-26 DIAGNOSIS — R2689 Other abnormalities of gait and mobility: Secondary | ICD-10-CM | POA: Diagnosis not present

## 2023-09-26 NOTE — Therapy (Signed)
 OUTPATIENT OCCUPATIONAL THERAPY PARKINSON'S TREATMENT/DISCHARGE  Patient Name: Jack Cobb MRN: [id number] DOB:26-Jun-1950, 73 y.o., male Today's Date: 09/26/2023  PCP: Aloha Arnold, PA-C  REFERRING PROVIDER: Debbra Fairy, MD   OCCUPATIONAL THERAPY DISCHARGE SUMMARY  Visits from Start of Care: 7  Current functional level related to goals / functional outcomes: SEE BELOW   Remaining deficits: Dyskinesias Postural tremors Coordination balance   Education / Equipment: See below HEP section for extensive education and HEP   Patient agrees to discharge. Patient goals were met (except 1 LTG). Patient is being discharged due to being pleased with the current functional level..     END OF SESSION:  OT End of Session - 09/26/23 0805     Visit Number 7    Number of Visits 9    Date for OT Re-Evaluation 09/26/23    Authorization Type [employer] Medicare - [employer] req    OT Start Time 1914    OT Stop Time 0845    OT Time Calculation (min) 41 min    Activity Tolerance Patient tolerated treatment well    Behavior During Therapy WFL for tasks assessed/performed             Past Medical History:  Diagnosis Date   CAD (coronary artery disease), native coronary artery 09/06/2020   Coronary CTA was done showing a coronary calcium  score of 201 placing him at the 60th percentile for age and gender.  He was noted to have 75 to 99% stenosis of the mid LAD with scattered nonobstructive plaquing elsewhere.  FFR 0.7 in D1 and 0.73 at the apex>>medical management   HLD (hyperlipidemia) 09/06/2020   Hypertension    Parkinson disease [facility])    Past Surgical History:  Procedure Laterality Date   BACK SURGERY     2019.2023, january 2024 lumbar surgeries with dr. pool   HERNIA REPAIR     ventral hernia repair with mesh x2   LUMBAR LAMINECTOMY/DECOMPRESSION MICRODISCECTOMY Left 09/19/2022   Procedure: Microdiscectomy - left - Lumbar Four-Lumbar Five;  Surgeon: Agustina Aldrich, MD;  Location: [facility]  OR;  Service: Neurosurgery;  Laterality: Left;  3C   Patient Active Problem List   Diagnosis Date Noted   Recurrent herniation of lumbar disc 09/19/2022   CAD (coronary artery disease), native coronary artery 09/06/2020   HLD (hyperlipidemia) 09/06/2020   PAF (paroxysmal atrial fibrillation) (HCC) 06/13/2020   Essential hypertension 06/13/2020   Elevated troponin 06/13/2020   Parkinson's disease (HCC) 12/12/2012    ONSET DATE: 08/15/2023 (Date of referral); has been dx with PD since 2007  REFERRING DIAG:  G20.B1 (ICD-10-CM) - Parkinson's disease with dyskinesia, without mention of fluctuations  G20.B2 (ICD-10-CM) - Parkinson's disease with dyskinesia, with fluctuations  K59.00 (ICD-10-CM) - Constipation, unspecified  R29.818 (ICD-10-CM) - Other symptoms and signs involving the nervous system  R29.898 (ICD-10-CM) - Other symptoms and signs involving the musculoskeletal system  I49.9 (ICD-10-CM) - Cardiac arrhythmia, unspecified  R29.3 (ICD-10-CM) - Abnormal posture   THERAPY DIAG:  Unsteadiness on feet  Abnormal posture  Other lack of coordination  Other symptoms and signs involving the nervous system  Other symptoms and signs involving the musculoskeletal system  Rationale for Evaluation and Treatment: Rehabilitation  SUBJECTIVE:   SUBJECTIVE STATEMENT: I am stiff as a board and sore 5/10 pain all over, but besides that, I'm good. I haven't fallen since last time  Pt accompanied by: significant other   PERTINENT HISTORY: PMH - hypertension, HLD, PAF, CAD, s/p hernia repair in 2012, and s/p multiple  back surgeries   PRECAUTIONS: Fall  WEIGHT BEARING RESTRICTIONS: No  PAIN:  Are you having pain? Yes: NPRS scale: 3/10 Pain location: low back Pain description: stiff; sore Aggravating factors: medication timing Relieving factors: medications; CBD cream  FALLS: Has patient fallen in last 6 months? 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  HAND DOMINANCE: Right  ADLs: Overall ADLs: mod I  IADLs: Light housekeeping: mod I Meal Prep: mod I Community mobility: dependent Medication management: setup Financial management: mod I Handwriting: 50% legible and Severe micrographia  MOBILITY STATUS: Independent - uses cane  POSTURE COMMENTS:  rounded shoulders and forward head  ACTIVITY TOLERANCE: Activity tolerance: good  FUNCTIONAL OUTCOME MEASURES: Fastening/unfastening 3 buttons: 43 seconds Physical performance test: PPT#2 (simulated eating) 12 seconds & PPT#4 (donning/doffing jacket): 10 seconds  COORDINATION: 9 Hole Peg test: Right: 31 sec; Left: 29 sec Box and Blocks:  Right 47 blocks, Left 50 blocks Tremors: Resting, action, Right, Left, and head  UE ROM:  WFL  UE MMT:   WFL  SENSATION: WFL  MUSCLE TONE: WFL  COGNITION: Overall cognitive status: Within functional limits for tasks assessed  OBSERVATIONS: Bradykinesia, Dyskinesias, and Postural tremors                                                                                                                   TREATMENT:   Pt brought in binder and therapist hole punched some exercises and placed in binder (organized w/ ex's in front and education in back)   Assessed remaining LTG's:  Pt writing paragraph maintaining size and 95% legibility 3 button/unbutton test = 28 sec Box & Blocks Rt = 48  Reviewed ADL strategies, safety with shaving (pt just recently purchased electric shaver), getting things/wallet out of pockets, general recommendations  for preventing and/or minimizing PD related complications, and setting up PD screens in 6 months  PATIENT EDUCATION: Education details: see above Person educated: Patient and Spouse Education method: Explanation, Demonstration, Verbal cues, and Handouts Education comprehension: verbalized understanding, returned demonstration, verbal cues required, and needs further education  HOME EXERCISE PROGRAM: 08/27/23: [place]! Hands, handwriting strategies 09/11/23: coordination HEP, big ADL strategies 09/14/2023: PWR! Sitting 09/17/2023: [place] method, ways to prevent PD complications, POP meeting info 09/24/23: community and online PD resources, memory strategies and ways to keep thinking skills sharp   GOALS:  SHORT TERM GOALS: Target date: 09/18/2023    Pt will be independent with PD specific HEP.  Baseline: not yet initiated Goal status: MET  2.  Pt will verbalize understanding of adapted strategies to maximize safety and independence with ADLs/IADLs.  Baseline: not yet initiated Goal  status: MET   LONG TERM GOALS: Target date: 09/26/2023    Pt will verbalize understanding of ways to prevent future PD related complications and PD community resources.  Baseline: not yet initiated Goal status: MET  2.  Pt will write a short paragraph with no significant decrease in size and maintain 100% legibility.  Baseline: 50% legibility with moderate micrographia Goal status: PARTIALLY MET (maintained size, 95% legible)   3.  Pt will verbalize understanding of ways to keep thinking skills sharp and ways to compensate for STM changes in the future.  Baseline: not yet initiated Goal status: MET   4.  Pt will demonstrate improved ease with fastening buttons as evidenced by decreasing 3 button/unbutton time to 35 seconds or less.  Baseline: 43 seconds Goal status: MET 28.89 sec - timed w/ his own shirt on  5.  Pt will be able to place at least 50 blocks using R hand with completion of Box and Blocks  test.  Baseline: 47 blocks Goal status: NOT MET (48 blocks)    ASSESSMENT:  CLINICAL IMPRESSION: Patient has met all STG's and 4/5 LTG's at this time. Pt with dyskinesias and pt/family reports still trying to work out medication dosage - sees movement disorder specialist this coming Monday.  PERFORMANCE DEFICITS: in functional skills including ADLs, IADLs, coordination, Fine motor control, Gross motor control, and UE functional use.   IMPAIRMENTS: are limiting patient from ADLs, IADLs, and leisure.   COMORBIDITIES:  may have co-morbidities  that affects occupational performance. Patient will benefit from skilled OT to address above impairments and improve overall function.  REHAB POTENTIAL: Fair given PD prognosis  PLAN:  OT FREQUENCY: 2x/week  OT DURATION: 4 weeks (extended to 9 total visits due to scheduling issues)  PLANNED INTERVENTIONS: 97168 OT Re-evaluation, 97535 self care/ADL training, 86578 therapeutic exercise, 97530 therapeutic activity, 97112 neuromuscular re-education, functional mobility training, and patient/family education  RECOMMENDED OTHER SERVICES: Recommend ST referral for evaluation and treatment of cognition and speech.   CONSULTED AND AGREED WITH PLAN OF CARE: Patient and family member/caregiver  PLAN:  D/C O.T. (Scheduled screens in 6 months)    Velinda Getting, OT 09/26/2023, 8:06 AM

## 2023-09-26 NOTE — Therapy (Signed)
 OUTPATIENT PHYSICAL THERAPY NEURO TREATMENT- 10TH VISIT PROGRESS NOTE AND DISCHARGE SUMMARY    Patient Name: Jack Cobb MRN: [id number] DOB:Oct 02, 1950, 73 y.o., male Today's Date: 09/26/2023   PCP: Aloha Arnold, PA-C REFERRING PROVIDER: Debbra Fairy, MD  PHYSICAL THERAPY DISCHARGE SUMMARY  Visits from Start of Care: 10  Current functional level related to goals / functional outcomes: Mod I- S* w/ADLs without AD    Remaining deficits: High fall risk, decreased safety awareness, PD w/dyskinesias, postural deficits and chronic back pain     Education / Equipment: HEP, 11mo PD screen    Patient agrees to discharge. Patient goals were partially met. Patient is being discharged due to the patient's request.  Physical Therapy Progress Note   Dates of Reporting Period:08/21/23 - 09/26/23  See Note below for Objective Data and Assessment of Progress/Goals.    END OF SESSION:  PT End of Session - 09/26/23 0848     Visit Number 10    Number of Visits 13    Date for PT Re-Evaluation 10/16/23    Authorization Type UHC Medicare    Authorization Time Period 08/21/23 - 10/02/23 13 PT visits    PT Start Time 0847    PT Stop Time 0935    PT Time Calculation (min) 48 min    Equipment Utilized During Treatment Gait belt    Activity Tolerance Patient tolerated treatment well    Behavior During Therapy WFL for tasks assessed/performed                  Past Medical History:  Diagnosis Date   CAD (coronary artery disease), native coronary artery 09/06/2020   Coronary CTA was done showing a coronary calcium  score of 201 placing him at the 60th percentile for age and gender.  He was noted to have 75 to 99% stenosis of the mid LAD with scattered nonobstructive plaquing elsewhere.  FFR 0.7 in D1 and 0.73 at the apex>>medical management   HLD (hyperlipidemia) 09/06/2020   Hypertension    Parkinson disease [facility])    Past Surgical History:  Procedure Laterality Date   BACK SURGERY      2019.2023, january 2024 lumbar surgeries with dr. pool   HERNIA REPAIR     ventral hernia repair with mesh x2   LUMBAR LAMINECTOMY/DECOMPRESSION MICRODISCECTOMY Left 09/19/2022   Procedure: Microdiscectomy - left - Lumbar Four-Lumbar Five;  Surgeon: Agustina Aldrich, MD;  Location: [facility] OR;  Service: Neurosurgery;  Laterality: Left;  3C   Patient Active Problem List   Diagnosis Date Noted   Recurrent herniation of lumbar disc 09/19/2022   CAD (coronary artery disease), native coronary artery 09/06/2020   HLD (hyperlipidemia) 09/06/2020   PAF (paroxysmal atrial fibrillation) (HCC) 06/13/2020   Essential hypertension 06/13/2020   Elevated troponin 06/13/2020   Parkinson's disease (HCC) 12/12/2012    ONSET DATE: 08/15/2023 (referral)  REFERRING DIAG: G20.B2 (ICD-10-CM) - Parkinson's disease with dyskinesia and fluctuating manifestations (HCC) G20.B1 (ICD-10-CM) - Dyskinesia due to Parkinson's disease (HCC) K59.00 (ICD-10-CM) - Constipation, unspecified constipation type I49.9 (ICD-10-CM) - Irregular heartbeat R29.818,R29.898 (ICD-10-CM) - Fine motor impairment R29.3 (ICD-10-CM) - Posture abnormality  THERAPY DIAG:  Unsteadiness on feet  Abnormal posture  Other abnormalities of gait and mobility  Other low back pain  Rationale for Evaluation and Treatment: Rehabilitation  SUBJECTIVE:  SUBJECTIVE STATEMENT: Pt reports doing okay. Denies falls. Exercises are going "ok", states he is sore all over. Rating soreness as a 4-5/10. Feels ready to DC today (also DC from OT today)   Pt accompanied by: self and   Wife  PERTINENT HISTORY: hypertension, HLD, PAF, CAD, s/p hernia repair in 2012, and s/p multiple back surgeries. Diagnosed w/PD in 2007   PAIN:  Are you having pain?  No, just soreness (see  subjective)    PRECAUTIONS: Fall  RED FLAGS: None   WEIGHT BEARING RESTRICTIONS: No  FALLS: Has patient fallen in last 6 months?  No, but several near misses   LIVING ENVIRONMENT: Lives with: lives with their spouse Lives in: House/apartment Stairs: Yes: Internal: 15 steps; on right going up and External: 15 steps; on right going up, on left going up, and can reach both Has following equipment at home: Single point cane and Walker - 2 wheeled  PLOF: Independent  PATIENT GOALS: " Stabilize"   OBJECTIVE:  Note: Objective measures were completed at Evaluation unless otherwise noted.  DIAGNOSTIC FINDINGS: MRI of Lumbar spine from 08/2022  IMPRESSION: 1. New prominent marrow edema in the left sacral ala, suspicious for insufficiency fracture. 2. New left subarticular disc extrusion at L4-L5 potentially affecting the descending left L5 nerve root. 3. Interval posterior decompression at L5-S1 with improved now mild left lateral recess stenosis at L5-S1. 4. Unchanged severe bilateral neuroforaminal stenosis at L4-L5 and L5-S1.  COGNITION: Overall cognitive status: Within functional limits for tasks assessed   SENSATION: Pt reports frequent numbness of L foot, thinks due to history of low back surgeries.   COORDINATION: Heel to shin test: WNL bilaterally    POSTURE: rounded shoulders, forward head, and difficult to assess due to dyskinesias   LOWER EXTREMITY ROM:     Active  Right Eval Left Eval  Hip flexion    Hip extension    Hip abduction    Hip adduction    Hip internal rotation    Hip external rotation    Knee flexion    Knee extension    Ankle dorsiflexion    Ankle plantarflexion    Ankle inversion    Ankle eversion     (Blank rows = not tested)  LOWER EXTREMITY MMT:  Tested in seated position   MMT Right Eval Left Eval  Hip flexion 5 5  Hip extension    Hip abduction 5 5  Hip adduction 5 5  Hip internal rotation    Hip external rotation     Knee flexion 5 5  Knee extension 5 5  Ankle dorsiflexion 5 5  Ankle plantarflexion    Ankle inversion    Ankle eversion    (Blank rows = not tested)  BED MOBILITY:  Independent per pt   TRANSFERS: Assistive device utilized: None  Sit to stand: Modified independence Stand to sit: Modified independence Chair to chair: Modified independence   GAIT: Gait pattern: step through pattern, decreased stride length, shuffling, festinating, lateral hip instability, trunk flexed, and wide BOS Distance walked: Various clinic distances  Assistive device utilized: Single point cane and None Level of assistance: SBA and CGA Comments: Significant freezing w/turns and walking into doorways. Pt w/forward flexed posture and fighting his freeze, requiring CGA to avoid anterior LOB.   PATIENT SURVEYS:  Freezing of gait questionnaire 14/24  TREATMENT     Ther Act FOGQ: 14/24 Verbally reviewed freezing strategies and pt reports he is better about them outside than he is inside as he has bad habit of reaching forward for furniture to stabilize. However, pt more aware of this and will continue to work on it  Reiterated importance of proper body mechanics on posture and pt aware that he needs to work on this.  Verbally reviewed HEP and pt reports he does not like standing PWR up as it hurts his back and wife is afraid he will fall forward. Informed pt to place a chair in front of him when performing or perform in seated position. Pt and wife verbalized understanding and declined reviewing in session.  Discussed scheduling PD screen in 70mo and pt in agreement with this. Informed pt and wife they may return sooner if mobility needs change.   Physical Performance   [facility] PT Assessment - 09/26/23 0908       Mini-BESTest   Sit To Stand Normal: Comes to stand without use of hands and  stabilizes independently.    Rise to Toes Normal: Stable for 3 s with maximum height.    Stand on one leg (left) Moderate: < 20 s   2.29s   Stand on one leg (right) Moderate: < 20 s   5.84   Stand on one leg - lowest score 1    Compensatory Stepping Correction - Forward Normal: Recovers independently with a single, large step (second realignement is allowed).    Compensatory Stepping Correction - Backward No step, OR would fall if not caught, OR falls spontaneously.   Pt refused to try   Compensatory Stepping Correction - Left Lateral Moderate: Several steps to recover equilibrium   3 steps   Compensatory Stepping Correction - Right Lateral Normal: Recovers independently with 1 step (crossover or lateral OK)   large crossover step   Stepping Corredtion Lateral - lowest score 1    Stance - Feet together, eyes open, firm surface  Normal: 30s    Stance - Feet together, eyes closed, foam surface  Moderate: < 30s   11.97s   Incline - Eyes Closed Moderate: Stands independently < 30s OR aligns with surface   improved posture, but only able to hold for 25.31s due to LBP   Change in Gait Speed Moderate: Unable to change walking speed or signs of imbalance    Walk with head turns - Horizontal Moderate: performs head turns with reduction in gait speed.    Walk with pivot turns Moderate:Turns with feet close SLOW (>4 steps) with good balance.    Step over obstacles Normal: Able to step over box with minimal change of gait speed and with good balance.    Timed UP & GO with Dual Task Normal: No noticeable change in sitting, standing or walking while backward counting when compared to TUG without    Mini-BEST total score 19      Timed Up and Go Test   Normal TUG (seconds) 8.69   no AD, no freezing   Cognitive TUG (seconds) 9.44   Retro counting by 2s (pt refused to try 3s)             PATIENT EDUCATION: Education details: See ther act section, results of minibest  Person educated: Patient and  Spouse Education method: Explanation, Demonstration, and Verbal cues Education comprehension: verbalized understanding and returned demonstration  HOME EXERCISE PROGRAM: Standing PWR! Moves with chair in front 2 x 10 reps 3x  week   Access Code: [email or web address] URL: [email or web address] Date: 09/14/2023 Prepared by: Burleigh Carp Joniqua Sidle  Exercises - Cat Cow  - 1 x daily - 7 x weekly - 3 sets - 10 reps - Sidelying Open Book Thoracic Lumbar Rotation and Extension  - 1 x daily - 7 x weekly - 3 sets - 10 reps - Prone Press Up On Elbows  - 1 x daily - 7 x weekly - 3-5 minutes hold  GOALS: Goals reviewed with patient? Yes  SHORT TERM GOALS: Target date: 09/18/2023   Pt will be independent with initial HEP for improved strength, balance, transfers and gait.  Baseline: not established on eval  Goal status: MET  2.  Pt will verbalize and demonstrate freezing strategies to implement at home and in community for improved mobility and reduced fall risk  Baseline:  Goal status: MET  3.  MiniBest to be assessed and LTG updated  Baseline: assessed on 4/7 Goal status: MET   LONG TERM GOALS: Target date: 10/02/2023   Patient will improve miniBEST score to 20/28 to indicate progress towards a decreased risk of falls and improved dynamic and static stability.   Baseline: 16/28; 19/28 (5/7) Goal status: PARTIALLY MET  2.  Pt will score </= 10/24 on FOGQ for improved freezing strategies and reduced fall risk  Baseline: 14/24 (5/7) Goal status: NOT MET    ASSESSMENT:  CLINICAL IMPRESSION: Emphasis of skilled PT session on LTG assessment and DC from PT. Pt has partially met 1 LTG and did not improve his score on FOGQ. Pt did improve his score on MiniBest, but narrowly missed his goal by 1 point, so consider goal partially met. Pt refused to perform stepping strategy in retro direction today and required max encouraging cues to perform cog TUG. Pt continues to be most limited by postural  deficits and poor freezing strategies, as pt relies on reaching forward onto furniture to stabilize when freezing. Pt reports he is "more focused" on freezing techniques when he is outside, but does not do this indoors. Pt currently ambulating without AD at mod I-S* level depending on dyskinesias and freezing. Pt to schedule 31mo PD screens and understands he needs to work on upright posture and freezing strategies prior to returning to PT.   OBJECTIVE IMPAIRMENTS: Abnormal gait, decreased balance, decreased knowledge of use of DME, decreased mobility, difficulty walking, impaired sensation, postural dysfunction, pain, and dyskinesias     ACTIVITY LIMITATIONS: carrying, lifting, bending, standing, squatting, transfers, locomotion level, and caring for others  PARTICIPATION LIMITATIONS: medication management, driving, shopping, community activity, and yard work  PERSONAL FACTORS: Fitness, Past/current experiences, and 1 comorbidity: PD w/dyskinesias  are also affecting patient's functional outcome.   REHAB POTENTIAL: Good  CLINICAL DECISION MAKING: Evolving/moderate complexity  EVALUATION COMPLEXITY: Moderate  PLAN:  PT FREQUENCY: 2x/week  PT DURATION: 6 weeks  PLANNED INTERVENTIONS: 97164- PT Re-evaluation, 97110-Therapeutic exercises, 97530- Therapeutic activity, 97112- Neuromuscular re-education, 97535- Self Care, 16109- Manual therapy, [phone]- Gait training, [phone]- Aquatic Therapy, Patient/Family education, Balance training, Dry Needling, Vestibular training, and DME instructions   Curtistine Downer Wilford Merryfield, PT, DPT 09/26/2023, 9:40 AM

## 2023-10-01 ENCOUNTER — Ambulatory Visit: Admitting: Physical Therapy

## 2023-10-01 DIAGNOSIS — R2689 Other abnormalities of gait and mobility: Secondary | ICD-10-CM | POA: Diagnosis not present

## 2023-10-01 DIAGNOSIS — G20B1 Parkinson's disease with dyskinesia, without mention of fluctuations: Secondary | ICD-10-CM | POA: Diagnosis not present

## 2023-10-01 DIAGNOSIS — G20B2 Parkinson's disease with dyskinesia, with fluctuations: Secondary | ICD-10-CM | POA: Diagnosis not present

## 2023-10-03 ENCOUNTER — Ambulatory Visit: Admitting: Physical Therapy

## 2023-10-04 ENCOUNTER — Ambulatory Visit: Admitting: Physical Therapy

## 2023-10-10 ENCOUNTER — Encounter

## 2023-10-11 ENCOUNTER — Ambulatory Visit: Admitting: Speech Pathology

## 2023-10-11 DIAGNOSIS — R278 Other lack of coordination: Secondary | ICD-10-CM | POA: Diagnosis not present

## 2023-10-11 DIAGNOSIS — R29818 Other symptoms and signs involving the nervous system: Secondary | ICD-10-CM | POA: Diagnosis not present

## 2023-10-11 DIAGNOSIS — R1310 Dysphagia, unspecified: Secondary | ICD-10-CM

## 2023-10-11 DIAGNOSIS — M5459 Other low back pain: Secondary | ICD-10-CM | POA: Diagnosis not present

## 2023-10-11 DIAGNOSIS — R293 Abnormal posture: Secondary | ICD-10-CM | POA: Diagnosis not present

## 2023-10-11 DIAGNOSIS — R471 Dysarthria and anarthria: Secondary | ICD-10-CM

## 2023-10-11 DIAGNOSIS — R41841 Cognitive communication deficit: Secondary | ICD-10-CM

## 2023-10-11 DIAGNOSIS — R2689 Other abnormalities of gait and mobility: Secondary | ICD-10-CM | POA: Diagnosis not present

## 2023-10-11 DIAGNOSIS — R2681 Unsteadiness on feet: Secondary | ICD-10-CM | POA: Diagnosis not present

## 2023-10-11 DIAGNOSIS — R29898 Other symptoms and signs involving the musculoskeletal system: Secondary | ICD-10-CM | POA: Diagnosis not present

## 2023-10-11 NOTE — Therapy (Signed)
 OUTPATIENT SPEECH LANGUAGE PATHOLOGY PARKINSON'S EVALUATION   Patient Name: Jack Cobb MRN: [id number] DOB:08/20/1950, 73 y.o., male Today's Date: 10/11/2023  PCP: Jack Arnold, PA-C REFERRING PROVIDER: Debbra Fairy, MD  END OF SESSION:  End of Session - 10/11/23 1357     Visit Number 2    Number of Visits 9    Date for SLP Re-Evaluation 11/30/23   extedned POC d/t delay in scheduling   Authorization Type UHC auth required    SLP Start Time 1358    SLP Stop Time  1445    SLP Time Calculation (min) 47 min    Activity Tolerance Patient tolerated treatment well             Past Medical History:  Diagnosis Date   CAD (coronary artery disease), native coronary artery 09/06/2020   Coronary CTA was done showing a coronary calcium  score of 201 placing him at the 60th percentile for age and gender.  He was noted to have 75 to 99% stenosis of the mid LAD with scattered nonobstructive plaquing elsewhere.  FFR 0.7 in D1 and 0.73 at the apex>>medical management   HLD (hyperlipidemia) 09/06/2020   Hypertension    Parkinson disease [facility])    Past Surgical History:  Procedure Laterality Date   BACK SURGERY     2019.2023, january 2024 lumbar surgeries with dr. pool   HERNIA REPAIR     ventral hernia repair with mesh x2   LUMBAR LAMINECTOMY/DECOMPRESSION MICRODISCECTOMY Left 09/19/2022   Procedure: Microdiscectomy - left - Lumbar Four-Lumbar Five;  Surgeon: Jack Aldrich, MD;  Location: [facility] OR;  Service: Neurosurgery;  Laterality: Left;  3C   Patient Active Problem List   Diagnosis Date Noted   Recurrent herniation of lumbar disc 09/19/2022   CAD (coronary artery disease), native coronary artery 09/06/2020   HLD (hyperlipidemia) 09/06/2020   PAF (paroxysmal atrial fibrillation) (HCC) 06/13/2020   Essential hypertension 06/13/2020   Elevated troponin 06/13/2020   Parkinson's disease (HCC) 12/12/2012    ONSET DATE: 08/22/2023 referral date  REFERRING DIAG:  G20.B2 (ICD-10-CM) -  Parkinson's disease with dyskinesia, with fluctuations (HCC)  R47.1 (ICD-10-CM) - Dysarthria    THERAPY DIAG:  Dysarthria  Cognitive communication deficit  Dysphagia, unspecified type  Rationale for Evaluation and Treatment: Rehabilitation  SUBJECTIVE:   SUBJECTIVE STATEMENT: no significant changes reported since evaluation.  Pt accompanied by: significant other  PERTINENT HISTORY: HTN, HLD, CAD, PAF, dx with PD 2007 with no hx of tx   PAIN:  Are you having pain? No  FALLS: Has patient fallen in last 6 months?  See PT evaluation for details  LIVING ENVIRONMENT: Lives with: lives with their spouse Lives in: House/apartment  PLOF: Independent, retired   PATIENT GOALS: improved communication  OBJECTIVE:  Note: Objective measures were completed at Evaluation unless otherwise noted.  COGNITION: Overall cognitive status: Within functional limits for tasks assessed Areas of impairment: Memory Comments: per pt report, mild changes in memory. Inability to complete mental math per baseline. Still uses saw mill and have trouble with measurements and calculations   MOTOR SPEECH: assessed across variety of speech tasks: reading, word repetition, generative discourse sample Overall motor speech: impaired Level of impairment: Word Rate of Speech: Variable Dysfluencies: none evidenced Phonation: breathy and low vocal intensity  Sustained phonation duration: 12 seconds Oral reading loudness average: 68 dB Conversational loudness average: 67 dB Voice Quality: hoarse, breathy, and strained  S/Z ratio: 11/9 Respiration: speaking on residual capacity Word and Phrasal Stress: reduced use of  stress Resonance: WFL Articulation: Impaired: word Diadochokinetic Rate (DDK): irregular rhythm and poorly sequenced Intelligibility: Intelligible Motor planning: Appears intact Effective technique: slow rate, increased vocal intensity, over articulate, and pacing  Stimulability trials:  Given SLP modeling and usual mod cues, loudness average increased to 76dB (range of 72 to 78 dB) at (loud "ah", word, sentence, paragraph, conversation) level.    ORAL MOTOR EXAMINATION: Overall status: Reduced lingual coordination/ROM   PATIENT REPORTED OUTCOME MEASURES (PROM): The Communicative Participation Item Bank        Does your condition interfere with... Pt Rating   ...talking with people you know 0   ...communicating when you need to say something quickly 0   ...talking with people you do not know 1   ...communicating when you are out in your community 0   ...asking questions in a conversation 1   ....communicating in a small group of people 2   ...having a long conversation 2   ...giving detailed infomrmation 1   ...getting your turn in a fast moving conversation 0   ...trying to persuade a friend or family member to see a different point of view 2  3= Not at all; 2=A little; 1=Quite a bit; 0=Very much                                                                                                                            TREATMENT DATE:  10/11/23: SLP provided introduction to Speak Out! Dysarthria training program. Provided pt education on principle of intent with synonyms and modeling given to support understanding. Led pt through [employer]! lesson 1 with usual modeling and verbal cues to optimize completion. Pt was able to meet dB targets for m-vowels, sustained ah, counting, reading, and cognitive exercise. He had challenges completing glides with intent and increased vocal intensity. SLP provided feedback for use of slow rate to reduce "slurred" speech quality and facilitate improved implementation of intent. Showed pt home exercise videos on Parkinson Voice Project website, provided instruction for BID home practice to optimize speech. Discussed using loud "ah" when voice "stops" Pt was able to teach back "intent" and "slow down" as speech strategies at conclusion of session.    09/11/2023: Evaluation completed and generated POC. SLP introduced Speak Out program and principle of intentional speech to aid in improve vocal quality and increased intensity. Led pt through warm up exercises with provision of usual SLP model and mod-A for accurate completion of exercises. Max-A needed for completion of glides and modification provided for use of stair steps.   PATIENT EDUCATION: Education details: see above Person educated: Patient and Spouse Education method: Explanation, Facilities manager, Verbal cues, and Handouts Education comprehension: verbalized understanding, returned demonstration, and needs further education  HOME EXERCISE PROGRAM: Watch PD voice project video    GOALS: Goals reviewed with patient? Yes  SHORT TERM GOALS: Target date:11/08/2023  Pt will complete HEP 5/7 days per week over 1 week period with mod-I  Baseline: Goal  status: INITIAL  2.  Pt will meet or exceed target dB for Speak Out exercises up to reading level in 2/2 sessions with min-A Baseline:  Goal status: INITIAL  3.  Pt will complete clinical swallow evaluation Baseline:  Goal status: INITIAL  4.  Pt will average 70+ dB over 8 minute conversation with rare verbal or visual cues  Baseline:  Goal status: INITIAL   LONG TERM GOALS: Target date: 7/11//2025 (date changed d/t delay in scheduling)   Pt will verbalize HEP plan for ongoing practice following ST d/c  Baseline:  Goal status: INITIAL  2.  Pt will meet or exceed target dB for Speak Out cognitive and conversational exercises up to  in 2/2 sessions with min-A Baseline:  Goal status: INITIAL  3.  Pt will average 70+ dB over 25 minute conversation with rare verbal or visual cues  Baseline:  Goal status: INITIAL  4.  Pt will teach back saliva management strategies with mod-I  Baseline:  Goal status: INITIAL   ASSESSMENT:  CLINICAL IMPRESSION: Patient is a 73 y.o. M who was seen today for motor speech in setting of  PD. Dx 2007 with no prior therapy. Pt reports variable communication success with challenges in being heard in presence of background noise and perception of slurred speech, more so in afternoon. Endorsing mild swallowing changes and sialorrhea, warranting further evaluation, to be completed next scheduled session. Evaluation reveals moderate dysarthria. Pt's speech is c/b reduced breath support, impercise articulation, low vocal intensity and occasional breathy vocal quality. Initiated training for [employer] with pt able to demonstrate completion of warm up exercises with clinician model and feedback. I recommend skilled ST to address dysarthria, evaluate for swallowing.   OBJECTIVE IMPAIRMENTS: Objective impairments include dysarthria and voice disorder. These impairments are limiting patient from effectively communicating at home and in community.Factors affecting potential to achieve goals and functional outcome are co-morbidities and previous level of function.. Patient will benefit from skilled SLP services to address above impairments and improve overall function.  REHAB POTENTIAL: Good  PLAN:  SLP FREQUENCY: 2x/week  SLP DURATION: 6 weeks  PLANNED INTERVENTIONS: Aspiration precaution training, Pharyngeal strengthening exercises, Diet toleration management , Cueing hierachy, Internal/external aids, Functional tasks, SLP instruction and feedback, Compensatory strategies, Patient/family education, [phone] Treatment of speech (30 or 45 min) , and [phone] Treatment of swallowing function   Alston Jerry, CCC-SLP 10/11/2023, 1:59 PM

## 2023-10-17 ENCOUNTER — Encounter: Payer: Self-pay | Admitting: Speech Pathology

## 2023-10-17 ENCOUNTER — Ambulatory Visit: Admitting: Speech Pathology

## 2023-10-17 DIAGNOSIS — R278 Other lack of coordination: Secondary | ICD-10-CM | POA: Diagnosis not present

## 2023-10-17 DIAGNOSIS — R471 Dysarthria and anarthria: Secondary | ICD-10-CM

## 2023-10-17 DIAGNOSIS — R29818 Other symptoms and signs involving the nervous system: Secondary | ICD-10-CM | POA: Diagnosis not present

## 2023-10-17 DIAGNOSIS — M5459 Other low back pain: Secondary | ICD-10-CM | POA: Diagnosis not present

## 2023-10-17 DIAGNOSIS — R2689 Other abnormalities of gait and mobility: Secondary | ICD-10-CM | POA: Diagnosis not present

## 2023-10-17 DIAGNOSIS — R1310 Dysphagia, unspecified: Secondary | ICD-10-CM | POA: Diagnosis not present

## 2023-10-17 DIAGNOSIS — R293 Abnormal posture: Secondary | ICD-10-CM | POA: Diagnosis not present

## 2023-10-17 DIAGNOSIS — R41841 Cognitive communication deficit: Secondary | ICD-10-CM

## 2023-10-17 DIAGNOSIS — R29898 Other symptoms and signs involving the musculoskeletal system: Secondary | ICD-10-CM | POA: Diagnosis not present

## 2023-10-17 DIAGNOSIS — R2681 Unsteadiness on feet: Secondary | ICD-10-CM | POA: Diagnosis not present

## 2023-10-17 NOTE — Therapy (Signed)
 OUTPATIENT SPEECH LANGUAGE PATHOLOGY PARKINSON'S EVALUATION   Patient Name: Jack Cobb MRN: [id number] DOB:11/17/1950, 73 y.o., male Today's Date: 10/17/2023  PCP: Aloha Arnold, PA-C REFERRING PROVIDER: Debbra Fairy, MD  END OF SESSION:  End of Session - 10/17/23 1448     Visit Number 3    Number of Visits 9    Date for SLP Re-Evaluation 11/30/23    Authorization Type UHC auth required    SLP Start Time 1448    SLP Stop Time  1530    SLP Time Calculation (min) 42 min    Activity Tolerance Patient tolerated treatment well             Past Medical History:  Diagnosis Date   CAD (coronary artery disease), native coronary artery 09/06/2020   Coronary CTA was done showing a coronary calcium  score of 201 placing him at the 60th percentile for age and gender.  He was noted to have 75 to 99% stenosis of the mid LAD with scattered nonobstructive plaquing elsewhere.  FFR 0.7 in D1 and 0.73 at the apex>>medical management   HLD (hyperlipidemia) 09/06/2020   Hypertension    Parkinson disease [facility])    Past Surgical History:  Procedure Laterality Date   BACK SURGERY     2019.2023, january 2024 lumbar surgeries with dr. pool   HERNIA REPAIR     ventral hernia repair with mesh x2   LUMBAR LAMINECTOMY/DECOMPRESSION MICRODISCECTOMY Left 09/19/2022   Procedure: Microdiscectomy - left - Lumbar Four-Lumbar Five;  Surgeon: Agustina Aldrich, MD;  Location: [facility] OR;  Service: Neurosurgery;  Laterality: Left;  3C   Patient Active Problem List   Diagnosis Date Noted   Recurrent herniation of lumbar disc 09/19/2022   CAD (coronary artery disease), native coronary artery 09/06/2020   HLD (hyperlipidemia) 09/06/2020   PAF (paroxysmal atrial fibrillation) (HCC) 06/13/2020   Essential hypertension 06/13/2020   Elevated troponin 06/13/2020   Parkinson's disease (HCC) 12/12/2012    ONSET DATE: 08/22/2023 referral date  REFERRING DIAG:  G20.B2 (ICD-10-CM) - Parkinson's disease with dyskinesia,  with fluctuations (HCC)  R47.1 (ICD-10-CM) - Dysarthria    THERAPY DIAG:  Dysarthria  Cognitive communication deficit  Rationale for Evaluation and Treatment: Rehabilitation  SUBJECTIVE:   SUBJECTIVE STATEMENT: no significant changes reported since evaluation.  Pt accompanied by: significant other  PERTINENT HISTORY: HTN, HLD, CAD, PAF, dx with PD 2007 with no hx of tx   PAIN:  Are you having pain? No  FALLS: Has patient fallen in last 6 months?  See PT evaluation for details  LIVING ENVIRONMENT: Lives with: lives with their spouse Lives in: House/apartment  PLOF: Independent, retired   PATIENT GOALS: improved communication  OBJECTIVE:  Note: Objective measures were completed at Evaluation unless otherwise noted.  COGNITION: Overall cognitive status: Within functional limits for tasks assessed Areas of impairment: Memory Comments: per pt report, mild changes in memory. Inability to complete mental math per baseline. Still uses saw mill and have trouble with measurements and calculations   MOTOR SPEECH: assessed across variety of speech tasks: reading, word repetition, generative discourse sample Overall motor speech: impaired Level of impairment: Word Rate of Speech: Variable Dysfluencies: none evidenced Phonation: breathy and low vocal intensity  Sustained phonation duration: 12 seconds Oral reading loudness average: 68 dB Conversational loudness average: 67 dB Voice Quality: hoarse, breathy, and strained  S/Z ratio: 11/9 Respiration: speaking on residual capacity Word and Phrasal Stress: reduced use of stress Resonance: WFL Articulation: Impaired: word Diadochokinetic Rate (DDK): irregular rhythm  and poorly sequenced Intelligibility: Intelligible Motor planning: Appears intact Effective technique: slow rate, increased vocal intensity, over articulate, and pacing  Stimulability trials: Given SLP modeling and usual mod cues, loudness average increased to  76dB (range of 72 to 78 dB) at (loud "ah", word, sentence, paragraph, conversation) level.    ORAL MOTOR EXAMINATION: Overall status: Reduced lingual coordination/ROM   PATIENT REPORTED OUTCOME MEASURES (PROM): The Communicative Participation Item Bank        Does your condition interfere with... Pt Rating   ...talking with people you know 0   ...communicating when you need to say something quickly 0   ...talking with people you do not know 1   ...communicating when you are out in your community 0   ...asking questions in a conversation 1   ....communicating in a small group of people 2   ...having a long conversation 2   ...giving detailed infomrmation 1   ...getting your turn in a fast moving conversation 0   ...trying to persuade a friend or family member to see a different point of view 2  3= Not at all; 2=A little; 1=Quite a bit; 0=Very much                                                                                                                            TREATMENT DATE:   10/17/23: Reviewed requirement to watch the What is Parkinson's video - they are still having difficulty with their tablet. SPEAK OUT! Workbook utilized for warm ups, ah, glides and counting.He hit dB targets with occasional min A. We generated list of 15 personally relevant phrases to practice and short script to self advocate when he makes phone calls to alert the listener to be patient with his speech - see Patient Instructions. Reading and conversation exercises completed on line with the "wedding" supplemental reading materials as he just attended his granddaughter's wedding. [name] maintained intent and emphasis with occasional min A in structured task and conversation re: the wedding he recently attended, averaging 72dB. Instructed him to complete at least 1 on line practice session before his next session here. He reports no consistently completing PT/OT HEP since his d/c. Will consider log for HEP for  PT/OT/ST if needed next session. Encouraged them to purchase a new tablet that will support the [employer].   10/11/23: SLP provided introduction to Speak Out! Dysarthria training program. Provided pt education on principle of intent with synonyms and modeling given to support understanding. Led pt through [employer]! lesson 1 with usual modeling and verbal cues to optimize completion. Pt was able to meet dB targets for m-vowels, sustained ah, counting, reading, and cognitive exercise. He had challenges completing glides with intent and increased vocal intensity. SLP provided feedback for use of slow rate to reduce "slurred" speech quality and facilitate improved implementation of intent. Showed pt home exercise videos on Parkinson Voice Project website, provided instruction for BID home practice to optimize  speech. Discussed using loud "ah" when voice "stops" Pt was able to teach back "intent" and "slow down" as speech strategies at conclusion of session.   09/11/2023: Evaluation completed and generated POC. SLP introduced Speak Out program and principle of intentional speech to aid in improve vocal quality and increased intensity. Led pt through warm up exercises with provision of usual SLP model and mod-A for accurate completion of exercises. Max-A needed for completion of glides and modification provided for use of stair steps.   PATIENT EDUCATION: Education details: see above Person educated: Patient and Spouse Education method: Explanation, Facilities manager, Verbal cues, and Handouts Education comprehension: verbalized understanding, returned demonstration, and needs further education  HOME EXERCISE PROGRAM: Watch PD voice project video    GOALS: Goals reviewed with patient? Yes  SHORT TERM GOALS: Target date:11/08/2023  Pt will complete HEP 5/7 days per week over 1 week period with mod-I  Baseline: Goal status: INITIAL  2.  Pt will meet or exceed target dB for Speak Out  exercises up to reading level in 2/2 sessions with min-A Baseline:  Goal status: INITIAL  3.  Pt will complete clinical swallow evaluation Baseline:  Goal status: INITIAL  4.  Pt will average 70+ dB over 8 minute conversation with rare verbal or visual cues  Baseline:  Goal status: INITIAL   LONG TERM GOALS: Target date: 7/11//2025 (date changed d/t delay in scheduling)   Pt will verbalize HEP plan for ongoing practice following ST d/c  Baseline:  Goal status: INITIAL  2.  Pt will meet or exceed target dB for Speak Out cognitive and conversational exercises up to  in 2/2 sessions with min-A Baseline:  Goal status: INITIAL  3.  Pt will average 70+ dB over 25 minute conversation with rare verbal or visual cues  Baseline:  Goal status: INITIAL  4.  Pt will teach back saliva management strategies with mod-I  Baseline:  Goal status: INITIAL   ASSESSMENT:  CLINICAL IMPRESSION: Patient is a 73 y.o. M who was seen today for motor speech in setting of PD. Dx 2007 with no prior therapy. Pt reports variable communication success with challenges in being heard in presence of background noise and perception of slurred speech, more so in afternoon. Endorsing mild swallowing changes and sialorrhea, warranting further evaluation, to be completed next scheduled session. Evaluation reveals moderate dysarthria. Pt's speech is c/b reduced breath support, impercise articulation, low vocal intensity and occasional breathy vocal quality. Initiated training for [employer] with pt able to demonstrate completion of warm up exercises with clinician model and feedback. I recommend skilled ST to address dysarthria, evaluate for swallowing.   OBJECTIVE IMPAIRMENTS: Objective impairments include dysarthria and voice disorder. These impairments are limiting patient from effectively communicating at home and in community.Factors affecting potential to achieve goals and functional outcome are  co-morbidities and previous level of function.. Patient will benefit from skilled SLP services to address above impairments and improve overall function.  REHAB POTENTIAL: Good  PLAN:  SLP FREQUENCY: 2x/week  SLP DURATION: 6 weeks  PLANNED INTERVENTIONS: Aspiration precaution training, Pharyngeal strengthening exercises, Diet toleration management , Cueing hierachy, Internal/external aids, Functional tasks, SLP instruction and feedback, Compensatory strategies, Patient/family education, [phone] Treatment of speech (30 or 45 min) , and [phone] Treatment of swallowing function   Tyshae Stair, Dareen Ebbing, CCC-SLP 10/17/2023, 3:34 PM

## 2023-10-17 NOTE — Patient Instructions (Addendum)
 Do watch the video "what it Parkinson's" on the Parkinson Voice Project website  Hi there Beautiful  Good morning to you  How are you doing  Jack Cobb, how are you today?  Where are we going fishing?  Hi Jack Cobb  How's the farm doing?   For [facility] - watch the What is Parkinson video on the website and complete 1 online practice session   Are the cows doing ok?  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  How's married life, Jack Cobb?  Is Jack Cobb doing OK?  He's studying Chief Technology Officer and she wants to be a Runner, broadcasting/film/video  My name is Jack Cobb. I have Parkinson's and have some difficulty speaking. Please be patient.

## 2023-10-19 ENCOUNTER — Encounter: Admitting: Speech Pathology

## 2023-10-23 ENCOUNTER — Ambulatory Visit: Attending: Neurology | Admitting: Speech Pathology

## 2023-10-23 ENCOUNTER — Encounter: Admitting: Speech Pathology

## 2023-10-23 DIAGNOSIS — R1310 Dysphagia, unspecified: Secondary | ICD-10-CM | POA: Insufficient documentation

## 2023-10-23 DIAGNOSIS — R471 Dysarthria and anarthria: Secondary | ICD-10-CM | POA: Insufficient documentation

## 2023-10-23 DIAGNOSIS — R41841 Cognitive communication deficit: Secondary | ICD-10-CM | POA: Insufficient documentation

## 2023-10-23 NOTE — Therapy (Signed)
 OUTPATIENT SPEECH LANGUAGE PATHOLOGY TREATMENT NOTE   Patient Name: Jack Cobb MRN: [id number] DOB:12/06/1950, 73 y.o., male Today's Date: 10/23/2023  PCP: Aloha Arnold, PA-C REFERRING PROVIDER: Debbra Fairy, MD  END OF SESSION:  End of Session - 10/23/23 0930     Number of Visits 9    Date for SLP Re-Evaluation 11/30/23    Authorization Type UHC auth required    SLP Start Time 0930    SLP Stop Time  1015    SLP Time Calculation (min) 45 min    Activity Tolerance Patient tolerated treatment well             Past Medical History:  Diagnosis Date   CAD (coronary artery disease), native coronary artery 09/06/2020   Coronary CTA was done showing a coronary calcium  score of 201 placing him at the 60th percentile for age and gender.  He was noted to have 75 to 99% stenosis of the mid LAD with scattered nonobstructive plaquing elsewhere.  FFR 0.7 in D1 and 0.73 at the apex>>medical management   HLD (hyperlipidemia) 09/06/2020   Hypertension    Parkinson disease [facility])    Past Surgical History:  Procedure Laterality Date   BACK SURGERY     2019.2023, january 2024 lumbar surgeries with dr. pool   HERNIA REPAIR     ventral hernia repair with mesh x2   LUMBAR LAMINECTOMY/DECOMPRESSION MICRODISCECTOMY Left 09/19/2022   Procedure: Microdiscectomy - left - Lumbar Four-Lumbar Five;  Surgeon: Agustina Aldrich, MD;  Location: [facility] OR;  Service: Neurosurgery;  Laterality: Left;  3C   Patient Active Problem List   Diagnosis Date Noted   Recurrent herniation of lumbar disc 09/19/2022   CAD (coronary artery disease), native coronary artery 09/06/2020   HLD (hyperlipidemia) 09/06/2020   PAF (paroxysmal atrial fibrillation) (HCC) 06/13/2020   Essential hypertension 06/13/2020   Elevated troponin 06/13/2020   Parkinson's disease (HCC) 12/12/2012    ONSET DATE: 08/22/2023 referral date  REFERRING DIAG:  G20.B2 (ICD-10-CM) - Parkinson's disease with dyskinesia, with fluctuations (HCC)   R47.1 (ICD-10-CM) - Dysarthria    THERAPY DIAG:  Dysarthria  Cognitive communication deficit  Dysphagia, unspecified type  Rationale for Evaluation and Treatment: Rehabilitation  SUBJECTIVE:   SUBJECTIVE STATEMENT: Pt has completed x2 practice sessions since last visit.   Pt accompanied by: significant other  PERTINENT HISTORY: HTN, HLD, CAD, PAF, dx with PD 2007 with no hx of tx   PAIN:  Are you having pain? No  FALLS: Has patient fallen in last 6 months?  See PT evaluation for details  LIVING ENVIRONMENT: Lives with: lives with their spouse Lives in: House/apartment  PLOF: Independent, retired   PATIENT GOALS: improved communication  OBJECTIVE:  Note: Objective measures were completed at Evaluation unless otherwise noted.  COGNITION: Overall cognitive status: Within functional limits for tasks assessed Areas of impairment: Memory Comments: per pt report, mild changes in memory. Inability to complete mental math per baseline. Still uses saw mill and have trouble with measurements and calculations   MOTOR SPEECH: assessed across variety of speech tasks: reading, word repetition, generative discourse sample Overall motor speech: impaired Level of impairment: Word Rate of Speech: Variable Dysfluencies: none evidenced Phonation: breathy and low vocal intensity  Sustained phonation duration: 12 seconds Oral reading loudness average: 68 dB Conversational loudness average: 67 dB Voice Quality: hoarse, breathy, and strained  S/Z ratio: 11/9 Respiration: speaking on residual capacity Word and Phrasal Stress: reduced use of stress Resonance: WFL Articulation: Impaired: word Diadochokinetic Rate (DDK):  irregular rhythm and poorly sequenced Intelligibility: Intelligible Motor planning: Appears intact Effective technique: slow rate, increased vocal intensity, over articulate, and pacing  Stimulability trials: Given SLP modeling and usual mod cues, loudness average  increased to 76dB (range of 72 to 78 dB) at (loud "ah", word, sentence, paragraph, conversation) level.    ORAL MOTOR EXAMINATION: Overall status: Reduced lingual coordination/ROM   PATIENT REPORTED OUTCOME MEASURES (PROM): The Communicative Participation Item Bank        Does your condition interfere with... Pt Rating   ...talking with people you know 0   ...communicating when you need to say something quickly 0   ...talking with people you do not know 1   ...communicating when you are out in your community 0   ...asking questions in a conversation 1   ....communicating in a small group of people 2   ...having a long conversation 2   ...giving detailed infomrmation 1   ...getting your turn in a fast moving conversation 0   ...trying to persuade a friend or family member to see a different point of view 2  3= Not at all; 2=A little; 1=Quite a bit; 0=Very much                                                                                                                            TREATMENT DATE:   10/23/23: SLP assisted patient in determining how best to implement HEP into daily routine.  Discussed setting a set schedule, in a designated spot, pairing with another activity, or using a checklist.  Patient reports he will attempt to complete HEP just before lunch.  He declines SLP offer of a HEP tracker.  SLP let patient through speak out lesson 2.  Patient consistently meeting decibel targets throughout exercise completion with rare min-A.  Patient reports difficulty with word finding or freezing during cognitive level tasks.  SLP proposed using.  Thank then speak strategy which overall appears effective for participation in cognitive based activities.  Patient shared that he visited with friends over the weekend and they were able to understand him, patient believes it is due to improvement in speech.  10/17/23: Reviewed requirement to watch the What is Parkinson's video - they are still  having difficulty with their tablet. SPEAK OUT! Workbook utilized for warm ups, ah, glides and counting.He hit dB targets with occasional min A. We generated list of 15 personally relevant phrases to practice and short script to self advocate when he makes phone calls to alert the listener to be patient with his speech - see Patient Instructions. Reading and conversation exercises completed on line with the "wedding" supplemental reading materials as he just attended his granddaughter's wedding. Toris maintained intent and emphasis with occasional min A in structured task and conversation re: the wedding he recently attended, averaging 72dB. Instructed him to complete at least 1 on line practice session before his next session here. He reports no consistently completing PT/OT HEP  since his d/c. Will consider log for HEP for PT/OT/ST if needed next session. Encouraged them to purchase a new tablet that will support the [employer].   10/11/23: SLP provided introduction to Speak Out! Dysarthria training program. Provided pt education on principle of intent with synonyms and modeling given to support understanding. Led pt through [employer]! lesson 1 with usual modeling and verbal cues to optimize completion. Pt was able to meet dB targets for m-vowels, sustained ah, counting, reading, and cognitive exercise. He had challenges completing glides with intent and increased vocal intensity. SLP provided feedback for use of slow rate to reduce "slurred" speech quality and facilitate improved implementation of intent. Showed pt home exercise videos on Parkinson Voice Project website, provided instruction for BID home practice to optimize speech. Discussed using loud "ah" when voice "stops" Pt was able to teach back "intent" and "slow down" as speech strategies at conclusion of session.   09/11/2023: Evaluation completed and generated POC. SLP introduced Speak Out program and principle of intentional  speech to aid in improve vocal quality and increased intensity. Led pt through warm up exercises with provision of usual SLP model and mod-A for accurate completion of exercises. Max-A needed for completion of glides and modification provided for use of stair steps.   PATIENT EDUCATION: Education details: see above Person educated: Patient and Spouse Education method: Explanation, Facilities manager, Verbal cues, and Handouts Education comprehension: verbalized understanding, returned demonstration, and needs further education  HOME EXERCISE PROGRAM: Watch PD voice project video    GOALS: Goals reviewed with patient? Yes  SHORT TERM GOALS: Target date:11/08/2023  Pt will complete HEP 5/7 days per week over 1 week period with mod-I  Baseline: Goal status: INITIAL  2.  Pt will meet or exceed target dB for Speak Out exercises up to reading level in 2/2 sessions with min-A Baseline:  Goal status: INITIAL  3.  Pt will complete clinical swallow evaluation Baseline:  Goal status: INITIAL  4.  Pt will average 70+ dB over 8 minute conversation with rare verbal or visual cues  Baseline:  Goal status: INITIAL   LONG TERM GOALS: Target date: 7/11//2025 (date changed d/t delay in scheduling)   Pt will verbalize HEP plan for ongoing practice following ST d/c  Baseline:  Goal status: INITIAL  2.  Pt will meet or exceed target dB for Speak Out cognitive and conversational exercises up to  in 2/2 sessions with min-A Baseline:  Goal status: INITIAL  3.  Pt will average 70+ dB over 25 minute conversation with rare verbal or visual cues  Baseline:  Goal status: INITIAL  4.  Pt will teach back saliva management strategies with mod-I  Baseline:  Goal status: INITIAL   ASSESSMENT:  CLINICAL IMPRESSION: Patient is a 73 y.o. M who was seen today for motor speech in setting of PD. Dx 2007 with no prior therapy. Pt reports variable communication success with challenges in being heard in  presence of background noise and perception of slurred speech, more so in afternoon. Endorsing mild swallowing changes and sialorrhea, warranting further evaluation, to be completed next scheduled session. Evaluation reveals moderate dysarthria. Pt's speech is c/b reduced breath support, impercise articulation, low vocal intensity and occasional breathy vocal quality. Initiated training for [employer] with pt able to demonstrate completion of warm up exercises with clinician model and feedback. I recommend skilled ST to address dysarthria, evaluate for swallowing.   OBJECTIVE IMPAIRMENTS: Objective impairments include dysarthria and voice disorder.  These impairments are limiting patient from effectively communicating at home and in community.Factors affecting potential to achieve goals and functional outcome are co-morbidities and previous level of function.. Patient will benefit from skilled SLP services to address above impairments and improve overall function.  REHAB POTENTIAL: Good  PLAN:  SLP FREQUENCY: 2x/week  SLP DURATION: 6 weeks  PLANNED INTERVENTIONS: Aspiration precaution training, Pharyngeal strengthening exercises, Diet toleration management , Cueing hierachy, Internal/external aids, Functional tasks, SLP instruction and feedback, Compensatory strategies, Patient/family education, [phone] Treatment of speech (30 or 45 min) , and [id number] Treatment of swallowing function   Alston Jerry, CCC-SLP 10/23/2023, 9:31 AM

## 2023-10-23 NOTE — Patient Instructions (Addendum)
 Home Exercise Routine:   10 or 11 AM best time   Pair with lunch

## 2023-10-25 ENCOUNTER — Encounter: Admitting: Speech Pathology

## 2023-11-05 NOTE — Therapy (Unsigned)
 OUTPATIENT SPEECH LANGUAGE PATHOLOGY TREATMENT NOTE   Patient Name: Jack Cobb MRN: [id number] DOB:01/08/1951, 73 y.o., male Today's Date: 11/06/2023  PCP: Aloha Arnold, PA-C REFERRING PROVIDER: Debbra Fairy, MD  END OF SESSION:  End of Session - 11/06/23 0927     Visit Number 5    Number of Visits 9    Date for SLP Re-Evaluation 11/30/23    Authorization Type UHC auth required    SLP Start Time 0930    SLP Stop Time  1015    SLP Time Calculation (min) 45 min    Activity Tolerance Patient tolerated treatment well           Past Medical History:  Diagnosis Date   CAD (coronary artery disease), native coronary artery 09/06/2020   Coronary CTA was done showing a coronary calcium  score of 201 placing him at the 60th percentile for age and gender.  He was noted to have 75 to 99% stenosis of the mid LAD with scattered nonobstructive plaquing elsewhere.  FFR 0.7 in D1 and 0.73 at the apex>>medical management   HLD (hyperlipidemia) 09/06/2020   Hypertension    Parkinson disease [facility])    Past Surgical History:  Procedure Laterality Date   BACK SURGERY     2019.2023, january 2024 lumbar surgeries with dr. pool   HERNIA REPAIR     ventral hernia repair with mesh x2   LUMBAR LAMINECTOMY/DECOMPRESSION MICRODISCECTOMY Left 09/19/2022   Procedure: Microdiscectomy - left - Lumbar Four-Lumbar Five;  Surgeon: Agustina Aldrich, MD;  Location: [facility] OR;  Service: Neurosurgery;  Laterality: Left;  3C   Patient Active Problem List   Diagnosis Date Noted   Recurrent herniation of lumbar disc 09/19/2022   CAD (coronary artery disease), native coronary artery 09/06/2020   HLD (hyperlipidemia) 09/06/2020   PAF (paroxysmal atrial fibrillation) (HCC) 06/13/2020   Essential hypertension 06/13/2020   Elevated troponin 06/13/2020   Parkinson's disease (HCC) 12/12/2012    ONSET DATE: 08/22/2023 referral date  REFERRING DIAG:  G20.B2 (ICD-10-CM) - Parkinson's disease with dyskinesia, with  fluctuations (HCC)  R47.1 (ICD-10-CM) - Dysarthria    THERAPY DIAG:  Dysarthria  Cognitive communication deficit  Rationale for Evaluation and Treatment: Rehabilitation  SUBJECTIVE:   SUBJECTIVE STATEMENT: pretty good  Pt accompanied by: significant other  PERTINENT HISTORY: HTN, HLD, CAD, PAF, dx with PD 2007 with no hx of tx   PAIN:  Are you having pain? No  FALLS: Has patient fallen in last 6 months?  See PT evaluation for details  LIVING ENVIRONMENT: Lives with: lives with their spouse Lives in: House/apartment  PLOF: Independent, retired   PATIENT GOALS: improved communication  OBJECTIVE:  Note: Objective measures were completed at Evaluation unless otherwise noted.  COGNITION: Overall cognitive status: Within functional limits for tasks assessed Areas of impairment: Memory Comments: per pt report, mild changes in memory. Inability to complete mental math per baseline. Still uses saw mill and have trouble with measurements and calculations   MOTOR SPEECH: assessed across variety of speech tasks: reading, word repetition, generative discourse sample Overall motor speech: impaired Level of impairment: Word Rate of Speech: Variable Dysfluencies: none evidenced Phonation: breathy and low vocal intensity  Sustained phonation duration: 12 seconds Oral reading loudness average: 68 dB Conversational loudness average: 67 dB Voice Quality: hoarse, breathy, and strained  S/Z ratio: 11/9 Respiration: speaking on residual capacity Word and Phrasal Stress: reduced use of stress Resonance: WFL Articulation: Impaired: word Diadochokinetic Rate (DDK): irregular rhythm and poorly sequenced Intelligibility: Intelligible Motor  planning: Appears intact Effective technique: slow rate, increased vocal intensity, over articulate, and pacing  Stimulability trials: Given SLP modeling and usual mod cues, loudness average increased to 76dB (range of 72 to 78 dB) at (loud ah,  word, sentence, paragraph, conversation) level.    ORAL MOTOR EXAMINATION: Overall status: Reduced lingual coordination/ROM   PATIENT REPORTED OUTCOME MEASURES (PROM): The Communicative Participation Item Bank        Does your condition interfere with... Pt Rating   ...talking with people you know 0   ...communicating when you need to say something quickly 0   ...talking with people you do not know 1   ...communicating when you are out in your community 0   ...asking questions in a conversation 1   ....communicating in a small group of people 2   ...having a long conversation 2   ...giving detailed infomrmation 1   ...getting your turn in a fast moving conversation 0   ...trying to persuade a friend or family member to see a different point of view 2  3= Not at all; 2=A little; 1=Quite a bit; 0=Very much                                                                                                                            TREATMENT DATE:  11/06/23: Endorsed completion of HEP on vacation. Targeted improving vocal quality and increasing intensity through progressively difficulty speech tasks using Speak Out! program, lesson #11. ST leads pt through exercises providing occasional model prior to pt execution. occasional min-A required to achieve target dB this date. Averages this date:  Sustained ah 84 dB Reading 81 dB Cognitive speech task 70 dB.  Conversational sample of approx 5 minutes, pt averages 70 dB with occasional min-A.    10/23/23: SLP assisted patient in determining how best to implement HEP into daily routine.  Discussed setting a set schedule, in a designated spot, pairing with another activity, or using a checklist.  Patient reports he will attempt to complete HEP just before lunch.  He declines SLP offer of a HEP tracker.  SLP let patient through speak out lesson 2.  Patient consistently meeting decibel targets throughout exercise completion with rare min-A.  Patient  reports difficulty with word finding or freezing during cognitive level tasks.  SLP proposed using.  Thank then speak strategy which overall appears effective for participation in cognitive based activities.  Patient shared that he visited with friends over the weekend and they were able to understand him, patient believes it is due to improvement in speech.  10/17/23: Reviewed requirement to watch the What is Parkinson's video - they are still having difficulty with their tablet. SPEAK OUT! Workbook utilized for warm ups, ah, glides and counting.He hit dB targets with occasional min A. We generated list of 15 personally relevant phrases to practice and short script to self advocate when he makes phone calls to alert the listener to be patient with his  speech - see Patient Instructions. Reading and conversation exercises completed on line with the wedding supplemental reading materials as he just attended his granddaughter's wedding. Hurley maintained intent and emphasis with occasional min A in structured task and conversation re: the wedding he recently attended, averaging 72dB. Instructed him to complete at least 1 on line practice session before his next session here. He reports no consistently completing PT/OT HEP since his d/c. Will consider log for HEP for PT/OT/ST if needed next session. Encouraged them to purchase a new tablet that will support the [employer].   10/11/23: SLP provided introduction to Speak Out! Dysarthria training program. Provided pt education on principle of intent with synonyms and modeling given to support understanding. Led pt through [employer]! lesson 1 with usual modeling and verbal cues to optimize completion. Pt was able to meet dB targets for m-vowels, sustained ah, counting, reading, and cognitive exercise. He had challenges completing glides with intent and increased vocal intensity. SLP provided feedback for use of slow rate to reduce slurred speech  quality and facilitate improved implementation of intent. Showed pt home exercise videos on Parkinson Voice Project website, provided instruction for BID home practice to optimize speech. Discussed using loud ah when voice stops Pt was able to teach back intent and slow down as speech strategies at conclusion of session.   09/11/2023: Evaluation completed and generated POC. SLP introduced Speak Out program and principle of intentional speech to aid in improve vocal quality and increased intensity. Led pt through warm up exercises with provision of usual SLP model and mod-A for accurate completion of exercises. Max-A needed for completion of glides and modification provided for use of stair steps.   PATIENT EDUCATION: Education details: see above Person educated: Patient and Spouse Education method: Explanation, Facilities manager, Verbal cues, and Handouts Education comprehension: verbalized understanding, returned demonstration, and needs further education  HOME EXERCISE PROGRAM: Watch PD voice project video    GOALS: Goals reviewed with patient? Yes  SHORT TERM GOALS: Target date:11/08/2023  Pt will complete HEP 5/7 days per week over 1 week period with mod-I  Baseline: Goal status: INITIAL  2.  Pt will meet or exceed target dB for Speak Out exercises up to reading level in 2/2 sessions with min-A Baseline: 11/06/23 Goal status: INITIAL  3.  Pt will complete clinical swallow evaluation Baseline:  Goal status: INITIAL  4.  Pt will average 70+ dB over 8 minute conversation with rare verbal or visual cues  Baseline:  Goal status: INITIAL   LONG TERM GOALS: Target date: 7/11//2025 (date changed d/t delay in scheduling)   Pt will verbalize HEP plan for ongoing practice following ST d/c  Baseline:  Goal status: INITIAL  2.  Pt will meet or exceed target dB for Speak Out cognitive and conversational exercises up to  in 2/2 sessions with min-A Baseline:  Goal status:  INITIAL  3.  Pt will average 70+ dB over 25 minute conversation with rare verbal or visual cues  Baseline:  Goal status: INITIAL  4.  Pt will teach back saliva management strategies with mod-I  Baseline:  Goal status: INITIAL   ASSESSMENT:  CLINICAL IMPRESSION: Patient is a 73 y.o. M who was seen today for motor speech in setting of PD. Conducted ongoing education and training for [employer] program with pt able to demonstrate completion of exercises and carryover intent into conversation with occasional min A. I recommend skilled ST to address dysarthria.  OBJECTIVE IMPAIRMENTS: Objective impairments  include dysarthria and voice disorder. These impairments are limiting patient from effectively communicating at home and in community.Factors affecting potential to achieve goals and functional outcome are co-morbidities and previous level of function.. Patient will benefit from skilled SLP services to address above impairments and improve overall function.  REHAB POTENTIAL: Good  PLAN:  SLP FREQUENCY: 2x/week  SLP DURATION: 6 weeks  PLANNED INTERVENTIONS: Aspiration precaution training, Pharyngeal strengthening exercises, Diet toleration management , Cueing hierachy, Internal/external aids, Functional tasks, SLP instruction and feedback, Compensatory strategies, Patient/family education, [phone] Treatment of speech (30 or 45 min) , and [id number] Treatment of swallowing function   Tamar Fairly, CCC-SLP 11/06/2023, 9:28 AM

## 2023-11-06 ENCOUNTER — Ambulatory Visit

## 2023-11-06 ENCOUNTER — Encounter: Admitting: Speech Pathology

## 2023-11-06 DIAGNOSIS — R1310 Dysphagia, unspecified: Secondary | ICD-10-CM | POA: Diagnosis not present

## 2023-11-06 DIAGNOSIS — R41841 Cognitive communication deficit: Secondary | ICD-10-CM

## 2023-11-06 DIAGNOSIS — R471 Dysarthria and anarthria: Secondary | ICD-10-CM | POA: Diagnosis not present

## 2023-11-08 ENCOUNTER — Encounter: Admitting: Speech Pathology

## 2023-11-13 ENCOUNTER — Encounter

## 2023-11-13 ENCOUNTER — Ambulatory Visit: Admitting: Speech Pathology

## 2023-11-13 DIAGNOSIS — R471 Dysarthria and anarthria: Secondary | ICD-10-CM

## 2023-11-13 DIAGNOSIS — R1310 Dysphagia, unspecified: Secondary | ICD-10-CM | POA: Diagnosis not present

## 2023-11-13 DIAGNOSIS — R41841 Cognitive communication deficit: Secondary | ICD-10-CM

## 2023-11-13 NOTE — Therapy (Signed)
 OUTPATIENT SPEECH LANGUAGE PATHOLOGY TREATMENT NOTE   Patient Name: Jack Cobb MRN: [id number] DOB:16-May-1951, 73 y.o., male Today's Date: 11/13/2023  PCP: Montey Lot, PA-C REFERRING PROVIDER: Buck Saucer, MD  END OF SESSION:  End of Session - 11/13/23 0935     Visit Number 6    Number of Visits 9    Date for SLP Re-Evaluation 11/30/23    Authorization Type UHC auth required    SLP Start Time 0935    SLP Stop Time  1015    SLP Time Calculation (min) 40 min    Activity Tolerance Patient tolerated treatment well           Past Medical History:  Diagnosis Date   CAD (coronary artery disease), native coronary artery 09/06/2020   Coronary CTA was done showing a coronary calcium  score of 201 placing him at the 60th percentile for age and gender.  He was noted to have 75 to 99% stenosis of the mid LAD with scattered nonobstructive plaquing elsewhere.  FFR 0.7 in D1 and 0.73 at the apex>>medical management   HLD (hyperlipidemia) 09/06/2020   Hypertension    Parkinson disease [facility])    Past Surgical History:  Procedure Laterality Date   BACK SURGERY     2019.2023, january 2024 lumbar surgeries with dr. pool   HERNIA REPAIR     ventral hernia repair with mesh x2   LUMBAR LAMINECTOMY/DECOMPRESSION MICRODISCECTOMY Left 09/19/2022   Procedure: Microdiscectomy - left - Lumbar Four-Lumbar Five;  Surgeon: Louis Shove, MD;  Location: [facility] OR;  Service: Neurosurgery;  Laterality: Left;  3C   Patient Active Problem List   Diagnosis Date Noted   Recurrent herniation of lumbar disc 09/19/2022   CAD (coronary artery disease), native coronary artery 09/06/2020   HLD (hyperlipidemia) 09/06/2020   PAF (paroxysmal atrial fibrillation) (HCC) 06/13/2020   Essential hypertension 06/13/2020   Elevated troponin 06/13/2020   Parkinson's disease (HCC) 12/12/2012    ONSET DATE: 08/22/2023 referral date  REFERRING DIAG:  G20.B2 (ICD-10-CM) - Parkinson's disease with dyskinesia, with  fluctuations (HCC)  R47.1 (ICD-10-CM) - Dysarthria    THERAPY DIAG:  Dysarthria  Cognitive communication deficit  Dysphagia, unspecified type  Rationale for Evaluation and Treatment: Rehabilitation  SUBJECTIVE:   SUBJECTIVE STATEMENT: pretty good  Pt accompanied by: significant other  PERTINENT HISTORY: HTN, HLD, CAD, PAF, dx with PD 2007 with no hx of tx   PAIN:  Are you having pain? No  FALLS: Has patient fallen in last 6 months?  See PT evaluation for details  LIVING ENVIRONMENT: Lives with: lives with their spouse Lives in: House/apartment  PLOF: Independent, retired   PATIENT GOALS: improved communication  OBJECTIVE:  Note: Objective measures were completed at Evaluation unless otherwise noted.  COGNITION: Overall cognitive status: Within functional limits for tasks assessed Areas of impairment: Memory Comments: per pt report, mild changes in memory. Inability to complete mental math per baseline. Still uses saw mill and have trouble with measurements and calculations   MOTOR SPEECH: assessed across variety of speech tasks: reading, word repetition, generative discourse sample Overall motor speech: impaired Level of impairment: Word Rate of Speech: Variable Dysfluencies: none evidenced Phonation: breathy and low vocal intensity  Sustained phonation duration: 12 seconds Oral reading loudness average: 68 dB Conversational loudness average: 67 dB Voice Quality: hoarse, breathy, and strained  S/Z ratio: 11/9 Respiration: speaking on residual capacity Word and Phrasal Stress: reduced use of stress Resonance: WFL Articulation: Impaired: word Diadochokinetic Rate (DDK): irregular rhythm and poorly  sequenced Intelligibility: Intelligible Motor planning: Appears intact Effective technique: slow rate, increased vocal intensity, over articulate, and pacing  Stimulability trials: Given SLP modeling and usual mod cues, loudness average increased to 76dB (range  of 72 to 78 dB) at (loud ah, word, sentence, paragraph, conversation) level.    ORAL MOTOR EXAMINATION: Overall status: Reduced lingual coordination/ROM   PATIENT REPORTED OUTCOME MEASURES (PROM): The Communicative Participation Item [employer]        Does your condition interfere with... Pt Rating   ...talking with people you know 0   ...communicating when you need to say something quickly 0   ...talking with people you do not know 1   ...communicating when you are out in your community 0   ...asking questions in a conversation 1   ....communicating in a small group of people 2   ...having a long conversation 2   ...giving detailed infomrmation 1   ...getting your turn in a fast moving conversation 0   ...trying to persuade a friend or family member to see a different point of view 2  3= Not at all; 2=A little; 1=Quite a bit; 0=Very much                                                                                                                            TREATMENT DATE:  11/13/23: Pt has not been completing HEP. SLP aided pt in generating HEP plan to target increased compliance. Education provided regarding benefit and need for daily practice to fully realize the benefits of Speak Out program for improved vocal quality. Target improving vocal quality and increasing intensity through progressively difficulty speech tasks using Speak Out! program, lesson 12. ST leads pt through exercises providing rare model prior to pt execution. rare min-A required to achieve target dB this date. Averages this date:  Sustained ah 84 dB Counting: 82 dB reading 73 dB (from [employer]) cognitive speech task 71 dB.  Conversational sample of approx 5 minutes, pt averages 72 dB, with occasional increased rate or impercise articulation. Benefits from occasional mod-A.   11/06/23: Endorsed completion of HEP on vacation. Targeted improving vocal quality and increasing intensity through progressively  difficulty speech tasks using Speak Out! program, lesson #11. ST leads pt through exercises providing occasional model prior to pt execution. occasional min-A required to achieve target dB this date. Averages this date:  Sustained ah 84 dB Reading 81 dB Cognitive speech task 70 dB.  Conversational sample of approx 5 minutes, pt averages 70 dB with occasional min-A.    10/23/23: SLP assisted patient in determining how best to implement HEP into daily routine.  Discussed setting a set schedule, in a designated spot, pairing with another activity, or using a checklist.  Patient reports he will attempt to complete HEP just before lunch.  He declines SLP offer of a HEP tracker.  SLP let patient through speak out lesson 2.  Patient consistently meeting decibel targets throughout exercise completion  with rare min-A.  Patient reports difficulty with word finding or freezing during cognitive level tasks.  SLP proposed using.  Thank then speak strategy which overall appears effective for participation in cognitive based activities.  Patient shared that he visited with friends over the weekend and they were able to understand him, patient believes it is due to improvement in speech.  10/17/23: Reviewed requirement to watch the What is Parkinson's video - they are still having difficulty with their tablet. SPEAK OUT! Workbook utilized for warm ups, ah, glides and counting.He hit dB targets with occasional min A. We generated list of 15 personally relevant phrases to practice and short script to self advocate when he makes phone calls to alert the listener to be patient with his speech - see Patient Instructions. Reading and conversation exercises completed on line with the wedding supplemental reading materials as he just attended his granddaughter's wedding. Chauncy maintained intent and emphasis with occasional min A in structured task and conversation re: the wedding he recently attended, averaging 72dB. Instructed  him to complete at least 1 on line practice session before his next session here. He reports no consistently completing PT/OT HEP since his d/c. Will consider log for HEP for PT/OT/ST if needed next session. Encouraged them to purchase a new tablet that will support the [employer].   10/11/23: SLP provided introduction to Speak Out! Dysarthria training program. Provided pt education on principle of intent with synonyms and modeling given to support understanding. Led pt through [employer]! lesson 1 with usual modeling and verbal cues to optimize completion. Pt was able to meet dB targets for m-vowels, sustained ah, counting, reading, and cognitive exercise. He had challenges completing glides with intent and increased vocal intensity. SLP provided feedback for use of slow rate to reduce slurred speech quality and facilitate improved implementation of intent. Showed pt home exercise videos on Parkinson Voice Project website, provided instruction for BID home practice to optimize speech. Discussed using loud ah when voice stops Pt was able to teach back intent and slow down as speech strategies at conclusion of session.   09/11/2023: Evaluation completed and generated POC. SLP introduced Speak Out program and principle of intentional speech to aid in improve vocal quality and increased intensity. Led pt through warm up exercises with provision of usual SLP model and mod-A for accurate completion of exercises. Max-A needed for completion of glides and modification provided for use of stair steps.   PATIENT EDUCATION: Education details: see above Person educated: Patient and Spouse Education method: Explanation, Facilities manager, Verbal cues, and Handouts Education comprehension: verbalized understanding, returned demonstration, and needs further education  HOME EXERCISE PROGRAM: Watch PD voice project video    GOALS: Goals reviewed with patient? Yes  SHORT TERM GOALS: Target  date:11/08/2023  Pt will complete HEP 5/7 days per week over 1 week period with mod-I  Baseline: Goal status: NOT MET  2.  Pt will meet or exceed target dB for Speak Out exercises up to reading level in 2/2 sessions with min-A Baseline: 11/06/23 Goal status: MET  3.  Pt will complete clinical swallow evaluation Baseline:  Goal status: deferred   4.  Pt will average 70+ dB over 8 minute conversation with rare verbal or visual cues  Baseline:  Goal status: INITIAL   LONG TERM GOALS: Target date: 7/11//2025 (date changed d/t delay in scheduling)   Pt will verbalize HEP plan for ongoing practice following ST d/c  Baseline:  Goal status: INITIAL  2.  Pt will meet or exceed target dB for Speak Out cognitive and conversational exercises up to  in 2/2 sessions with min-A Baseline:  Goal status: INITIAL  3.  Pt will average 70+ dB over 25 minute conversation with rare verbal or visual cues  Baseline:  Goal status: INITIAL  4.  Pt will teach back saliva management strategies with mod-I  Baseline:  Goal status: INITIAL   ASSESSMENT:  CLINICAL IMPRESSION: Patient is a 73 y.o. M who was seen today for motor speech in setting of PD. Conducted ongoing education and training for [employer] program with pt able to demonstrate completion of exercises and carryover intent into conversation with occasional min A. I recommend skilled ST to address dysarthria.  OBJECTIVE IMPAIRMENTS: Objective impairments include dysarthria and voice disorder. These impairments are limiting patient from effectively communicating at home and in community.Factors affecting potential to achieve goals and functional outcome are co-morbidities and previous level of function.. Patient will benefit from skilled SLP services to address above impairments and improve overall function.  REHAB POTENTIAL: Good  PLAN:  SLP FREQUENCY: 2x/week  SLP DURATION: 6 weeks  PLANNED INTERVENTIONS: Aspiration precaution  training, Pharyngeal strengthening exercises, Diet toleration management , Cueing hierachy, Internal/external aids, Functional tasks, SLP instruction and feedback, Compensatory strategies, Patient/family education, [phone] Treatment of speech (30 or 45 min) , and [id number] Treatment of swallowing function   Harlene LITTIE Ned, CCC-SLP 11/13/2023, 9:35 AM

## 2023-11-15 ENCOUNTER — Encounter: Admitting: Speech Pathology

## 2023-11-15 DIAGNOSIS — M5416 Radiculopathy, lumbar region: Secondary | ICD-10-CM | POA: Diagnosis not present

## 2023-11-19 ENCOUNTER — Ambulatory Visit

## 2023-11-19 DIAGNOSIS — R471 Dysarthria and anarthria: Secondary | ICD-10-CM

## 2023-11-19 DIAGNOSIS — R41841 Cognitive communication deficit: Secondary | ICD-10-CM

## 2023-11-19 DIAGNOSIS — R1310 Dysphagia, unspecified: Secondary | ICD-10-CM | POA: Diagnosis not present

## 2023-11-19 NOTE — Therapy (Signed)
 OUTPATIENT SPEECH LANGUAGE PATHOLOGY TREATMENT NOTE   Patient Name: Jack Cobb MRN: [id number] DOB:1951/02/27, 73 y.o., male Today's Date: 11/19/2023  PCP: Montey Lot, PA-C REFERRING PROVIDER: Buck Saucer, MD  END OF SESSION:  End of Session - 11/19/23 0927     Visit Number 7    Number of Visits 9    Date for SLP Re-Evaluation 11/30/23    Authorization Type UHC auth required    SLP Start Time 0930    SLP Stop Time  1015    SLP Time Calculation (min) 45 min    Activity Tolerance Patient tolerated treatment well            Past Medical History:  Diagnosis Date   CAD (coronary artery disease), native coronary artery 09/06/2020   Coronary CTA was done showing a coronary calcium  score of 201 placing him at the 60th percentile for age and gender.  He was noted to have 75 to 99% stenosis of the mid LAD with scattered nonobstructive plaquing elsewhere.  FFR 0.7 in D1 and 0.73 at the apex>>medical management   HLD (hyperlipidemia) 09/06/2020   Hypertension    Parkinson disease [facility])    Past Surgical History:  Procedure Laterality Date   BACK SURGERY     2019.2023, january 2024 lumbar surgeries with dr. pool   HERNIA REPAIR     ventral hernia repair with mesh x2   LUMBAR LAMINECTOMY/DECOMPRESSION MICRODISCECTOMY Left 09/19/2022   Procedure: Microdiscectomy - left - Lumbar Four-Lumbar Five;  Surgeon: Louis Shove, MD;  Location: [facility] OR;  Service: Neurosurgery;  Laterality: Left;  3C   Patient Active Problem List   Diagnosis Date Noted   Recurrent herniation of lumbar disc 09/19/2022   CAD (coronary artery disease), native coronary artery 09/06/2020   HLD (hyperlipidemia) 09/06/2020   PAF (paroxysmal atrial fibrillation) (HCC) 06/13/2020   Essential hypertension 06/13/2020   Elevated troponin 06/13/2020   Parkinson's disease (HCC) 12/12/2012    ONSET DATE: 08/22/2023 referral date  REFERRING DIAG:  G20.B2 (ICD-10-CM) - Parkinson's disease with dyskinesia, with  fluctuations (HCC)  R47.1 (ICD-10-CM) - Dysarthria    THERAPY DIAG:  Dysarthria  Cognitive communication deficit  Rationale for Evaluation and Treatment: Rehabilitation  SUBJECTIVE:   SUBJECTIVE STATEMENT: not bad re: speech since last ST session  Pt accompanied by: significant other  PERTINENT HISTORY: HTN, HLD, CAD, PAF, dx with PD 2007 with no hx of tx   PAIN:  Are you having pain? No  FALLS: Has patient fallen in last 6 months?  See PT evaluation for details  LIVING ENVIRONMENT: Lives with: lives with their spouse Lives in: House/apartment  PLOF: Independent, retired   PATIENT GOALS: improved communication  OBJECTIVE:  Note: Objective measures were completed at Evaluation unless otherwise noted.     TREATMENT DATE:  11/19/23: Reported oral reading for 20-40 mins/day paired with warm-up exercises (May-Me-My-Moe-Moo) intermittently. Due to reported vocal fatigue, recommending shortening readings to 5-10 minutes with use of intentional rate and volume, multiple times per day. Pt verbalized understanding.  Sustained ah 88 dB Counting: 85 dB Reading 76 dB  cognitive speech task 72 dB.  Conversational sample of approx 5 minutes, pt averages 70 dB, with occasional increased rate or impercise articulation. Benefits from occasional mod-A.  11/13/23: Pt has not been completing HEP. SLP aided pt in generating HEP plan to target increased compliance. Education provided regarding benefit and need for daily practice to fully realize the benefits of Speak Out program for improved vocal quality. Target improving  vocal quality and increasing intensity through progressively difficulty speech tasks using Speak Out! program, lesson 12. ST leads pt through exercises providing rare model prior to pt execution. rare min-A required to achieve target dB this date. Averages this date:  Sustained ah 84 dB Counting: 82 dB reading 73 dB (from [employer]) cognitive speech task 71  dB.  Conversational sample of approx 5 minutes, pt averages 72 dB, with occasional increased rate or impercise articulation. Benefits from occasional mod-A.   11/06/23: Endorsed completion of HEP on vacation. Targeted improving vocal quality and increasing intensity through progressively difficulty speech tasks using Speak Out! program, lesson #11. ST leads pt through exercises providing occasional model prior to pt execution. occasional min-A required to achieve target dB this date. Averages this date:  Sustained ah 84 dB Reading 81 dB Cognitive speech task 70 dB.  Conversational sample of approx 5 minutes, pt averages 70 dB with occasional min-A.   10/23/23: SLP assisted patient in determining how best to implement HEP into daily routine.  Discussed setting a set schedule, in a designated spot, pairing with another activity, or using a checklist.  Patient reports he will attempt to complete HEP just before lunch.  He declines SLP offer of a HEP tracker.  SLP let patient through speak out lesson 2.  Patient consistently meeting decibel targets throughout exercise completion with rare min-A.  Patient reports difficulty with word finding or freezing during cognitive level tasks.  SLP proposed using.  Thank then speak strategy which overall appears effective for participation in cognitive based activities.  Patient shared that he visited with friends over the weekend and they were able to understand him, patient believes it is due to improvement in speech.  10/17/23: Reviewed requirement to watch the What is Parkinson's video - they are still having difficulty with their tablet. SPEAK OUT! Workbook utilized for warm ups, ah, glides and counting.He hit dB targets with occasional min A. We generated list of 15 personally relevant phrases to practice and short script to self advocate when he makes phone calls to alert the listener to be patient with his speech - see Patient Instructions. Reading and  conversation exercises completed on line with the wedding supplemental reading materials as he just attended his granddaughter's wedding. Yannis maintained intent and emphasis with occasional min A in structured task and conversation re: the wedding he recently attended, averaging 72dB. Instructed him to complete at least 1 on line practice session before his next session here. He reports no consistently completing PT/OT HEP since his d/c. Will consider log for HEP for PT/OT/ST if needed next session. Encouraged them to purchase a new tablet that will support the [employer].   10/11/23: SLP provided introduction to Speak Out! Dysarthria training program. Provided pt education on principle of intent with synonyms and modeling given to support understanding. Led pt through [employer]! lesson 1 with usual modeling and verbal cues to optimize completion. Pt was able to meet dB targets for m-vowels, sustained ah, counting, reading, and cognitive exercise. He had challenges completing glides with intent and increased vocal intensity. SLP provided feedback for use of slow rate to reduce slurred speech quality and facilitate improved implementation of intent. Showed pt home exercise videos on Parkinson Voice Project website, provided instruction for BID home practice to optimize speech. Discussed using loud ah when voice stops Pt was able to teach back intent and slow down as speech strategies at conclusion of session.   09/11/2023: Evaluation  completed and generated POC. SLP introduced Speak Out program and principle of intentional speech to aid in improve vocal quality and increased intensity. Led pt through warm up exercises with provision of usual SLP model and mod-A for accurate completion of exercises. Max-A needed for completion of glides and modification provided for use of stair steps.   PATIENT EDUCATION: Education details: see above Person educated: Patient and  Spouse Education method: Explanation, Facilities manager, Verbal cues, and Handouts Education comprehension: verbalized understanding, returned demonstration, and needs further education  HOME EXERCISE PROGRAM: Watch PD voice project video    GOALS: Goals reviewed with patient? Yes  SHORT TERM GOALS: Target date:11/08/2023  Pt will complete HEP 5/7 days per week over 1 week period with mod-I  Baseline: Goal status: NOT MET  2.  Pt will meet or exceed target dB for Speak Out exercises up to reading level in 2/2 sessions with min-A Baseline: 11/06/23 Goal status: MET  3.  Pt will complete clinical swallow evaluation Baseline:  Goal status: deferred   4.  Pt will average 70+ dB over 8 minute conversation with rare verbal or visual cues  Baseline:  Goal status: PARTIALLY MET   LONG TERM GOALS: Target date: 7/11//2025 (date changed d/t delay in scheduling)   Pt will verbalize HEP plan for ongoing practice following ST d/c  Baseline:  Goal status: ONGOING  2.  Pt will meet or exceed target dB for Speak Out cognitive and conversational exercises up to  in 2/2 sessions with min-A Baseline: 11/19/23 Goal status: ONGOING  3.  Pt will average 70+ dB over 25 minute conversation with rare verbal or visual cues  Baseline:  Goal status: ONGOING  4.  Pt will teach back saliva management strategies with mod-I  Baseline:  Goal status: ONGOING   ASSESSMENT:  CLINICAL IMPRESSION: Patient is a 72 y.o. M who was seen today for motor speech in setting of PD. Conducted ongoing education and training for [employer] program with pt able to demonstrate completion of exercises and carryover intent into conversation with occasional min A. I recommend skilled ST to address dysarthria.  OBJECTIVE IMPAIRMENTS: Objective impairments include dysarthria and voice disorder. These impairments are limiting patient from effectively communicating at home and in community.Factors affecting potential to achieve  goals and functional outcome are co-morbidities and previous level of function.. Patient will benefit from skilled SLP services to address above impairments and improve overall function.  REHAB POTENTIAL: Good  PLAN:  SLP FREQUENCY: 2x/week  SLP DURATION: 6 weeks  PLANNED INTERVENTIONS: Aspiration precaution training, Pharyngeal strengthening exercises, Diet toleration management , Cueing hierachy, Internal/external aids, Functional tasks, SLP instruction and feedback, Compensatory strategies, Patient/family education, [phone] Treatment of speech (30 or 45 min) , and [id number] Treatment of swallowing function   Comer LILLETTE Louder, CCC-SLP 11/19/2023, 9:27 AM

## 2023-11-27 DIAGNOSIS — M5136 Other intervertebral disc degeneration, lumbar region with discogenic back pain only: Secondary | ICD-10-CM | POA: Diagnosis not present

## 2023-11-27 DIAGNOSIS — M5416 Radiculopathy, lumbar region: Secondary | ICD-10-CM | POA: Diagnosis not present

## 2023-11-27 DIAGNOSIS — M5126 Other intervertebral disc displacement, lumbar region: Secondary | ICD-10-CM | POA: Diagnosis not present

## 2023-11-27 DIAGNOSIS — M48061 Spinal stenosis, lumbar region without neurogenic claudication: Secondary | ICD-10-CM | POA: Diagnosis not present

## 2023-11-27 DIAGNOSIS — R609 Edema, unspecified: Secondary | ICD-10-CM | POA: Diagnosis not present

## 2023-11-27 NOTE — Therapy (Unsigned)
 OUTPATIENT SPEECH LANGUAGE PATHOLOGY TREATMENT NOTE   Patient Name: Jack Cobb MRN: [id number] DOB:Jul 31, 1950, 73 y.o., male Today's Date: 11/27/2023  PCP: Jack Lot, PA-C REFERRING PROVIDER: Buck Saucer, MD  END OF SESSION:      Past Medical History:  Diagnosis Date   CAD (coronary artery disease), native coronary artery 09/06/2020   Coronary CTA was done showing a coronary calcium  score of 201 placing him at the 60th percentile for age and gender.  He was noted to have 75 to 99% stenosis of the mid LAD with scattered nonobstructive plaquing elsewhere.  FFR 0.7 in D1 and 0.73 at the apex>>medical management   HLD (hyperlipidemia) 09/06/2020   Hypertension    Parkinson disease [facility])    Past Surgical History:  Procedure Laterality Date   BACK SURGERY     2019.2023, january 2024 lumbar surgeries with dr. pool   HERNIA REPAIR     ventral hernia repair with mesh x2   LUMBAR LAMINECTOMY/DECOMPRESSION MICRODISCECTOMY Left 09/19/2022   Procedure: Microdiscectomy - left - Lumbar Four-Lumbar Five;  Surgeon: Jack Shove, MD;  Location: [facility] OR;  Service: Neurosurgery;  Laterality: Left;  3C   Patient Active Problem List   Diagnosis Date Noted   Recurrent herniation of lumbar disc 09/19/2022   CAD (coronary artery disease), native coronary artery 09/06/2020   HLD (hyperlipidemia) 09/06/2020   PAF (paroxysmal atrial fibrillation) (HCC) 06/13/2020   Essential hypertension 06/13/2020   Elevated troponin 06/13/2020   Parkinson's disease (HCC) 12/12/2012    ONSET DATE: 08/22/2023 referral date  REFERRING DIAG:  G20.B2 (ICD-10-CM) - Parkinson's disease with dyskinesia, with fluctuations (HCC)  R47.1 (ICD-10-CM) - Dysarthria    THERAPY DIAG:  No diagnosis found.  Rationale for Evaluation and Treatment: Rehabilitation  SUBJECTIVE:   SUBJECTIVE STATEMENT:  Pt accompanied by: significant other  PERTINENT HISTORY: HTN, HLD, CAD, PAF, dx with PD 2007 with no hx of tx    PAIN:  Are you having pain? No  FALLS: Has patient fallen in last 6 months?  See PT evaluation for details  LIVING ENVIRONMENT: Lives with: lives with their spouse Lives in: House/apartment  PLOF: Independent, retired   PATIENT GOALS: improved communication  OBJECTIVE:  Note: Objective measures were completed at Evaluation unless otherwise noted.     TREATMENT DATE:  11/28/23:  11/19/23: Reported oral reading for 20-40 mins/day paired with warm-up exercises (May-Me-My-Moe-Moo) intermittently. Due to reported vocal fatigue, recommending shortening readings to 5-10 minutes with use of intentional rate and volume, multiple times per day. Pt verbalized understanding.  Sustained ah 88 dB Counting: 85 dB Reading 76 dB  cognitive speech task 72 dB.  Conversational sample of approx 5 minutes, pt averages 70 dB, with occasional increased rate or impercise articulation. Benefits from occasional mod-A.  11/13/23: Pt has not been completing HEP. SLP aided pt in generating HEP plan to target increased compliance. Education provided regarding benefit and need for daily practice to fully realize the benefits of Speak Out program for improved vocal quality. Target improving vocal quality and increasing intensity through progressively difficulty speech tasks using Speak Out! program, lesson 12. ST leads pt through exercises providing rare model prior to pt execution. rare min-A required to achieve target dB this date. Averages this date:  Sustained ah 84 dB Counting: 82 dB reading 73 dB (from [employer]) cognitive speech task 71 dB.  Conversational sample of approx 5 minutes, pt averages 72 dB, with occasional increased rate or impercise articulation. Benefits from occasional mod-A.  11/06/23: Endorsed completion of HEP on vacation. Targeted improving vocal quality and increasing intensity through progressively difficulty speech tasks using Speak Out! program, lesson #11. ST leads pt  through exercises providing occasional model prior to pt execution. occasional min-A required to achieve target dB this date. Averages this date:  Sustained ah 84 dB Reading 81 dB Cognitive speech task 70 dB.  Conversational sample of approx 5 minutes, pt averages 70 dB with occasional min-A.   10/23/23: SLP assisted patient in determining how best to implement HEP into daily routine.  Discussed setting a set schedule, in a designated spot, pairing with another activity, or using a checklist.  Patient reports he will attempt to complete HEP just before lunch.  He declines SLP offer of a HEP tracker.  SLP let patient through speak out lesson 2.  Patient consistently meeting decibel targets throughout exercise completion with rare min-A.  Patient reports difficulty with word finding or freezing during cognitive level tasks.  SLP proposed using.  Thank then speak strategy which overall appears effective for participation in cognitive based activities.  Patient shared that he visited with friends over the weekend and they were able to understand him, patient believes it is due to improvement in speech.  10/17/23: Reviewed requirement to watch the What is Parkinson's video - they are still having difficulty with their tablet. SPEAK OUT! Workbook utilized for warm ups, ah, glides and counting.He hit dB targets with occasional min A. We generated list of 15 personally relevant phrases to practice and short script to self advocate when he makes phone calls to alert the listener to be patient with his speech - see Patient Instructions. Reading and conversation exercises completed on line with the wedding supplemental reading materials as he just attended his granddaughter's wedding. [name] maintained intent and emphasis with occasional min A in structured task and conversation re: the wedding he recently attended, averaging 72dB. Instructed him to complete at least 1 on line practice session before his next session  here. He reports no consistently completing PT/OT HEP since his d/c. Will consider log for HEP for PT/OT/ST if needed next session. Encouraged them to purchase a new tablet that will support the [employer].   10/11/23: SLP provided introduction to Speak Out! Dysarthria training program. Provided pt education on principle of intent with synonyms and modeling given to support understanding. Led pt through [employer]! lesson 1 with usual modeling and verbal cues to optimize completion. Pt was able to meet dB targets for m-vowels, sustained ah, counting, reading, and cognitive exercise. He had challenges completing glides with intent and increased vocal intensity. SLP provided feedback for use of slow rate to reduce slurred speech quality and facilitate improved implementation of intent. Showed pt home exercise videos on Parkinson Voice Project website, provided instruction for BID home practice to optimize speech. Discussed using loud ah when voice stops Pt was able to teach back intent and slow down as speech strategies at conclusion of session.   09/11/2023: Evaluation completed and generated POC. SLP introduced Speak Out program and principle of intentional speech to aid in improve vocal quality and increased intensity. Led pt through warm up exercises with provision of usual SLP model and mod-A for accurate completion of exercises. Max-A needed for completion of glides and modification provided for use of stair steps.   PATIENT EDUCATION: Education details: see above Person educated: Patient and Spouse Education method: Explanation, Demonstration, Verbal cues, and Handouts Education comprehension: verbalized understanding, returned demonstration, and  needs further education  HOME EXERCISE PROGRAM: Watch PD voice project video    GOALS: Goals reviewed with patient? Yes  SHORT TERM GOALS: Target date:11/08/2023  Pt will complete HEP 5/7 days per week over 1 week period  with mod-I  Baseline: Goal status: NOT MET  2.  Pt will meet or exceed target dB for Speak Out exercises up to reading level in 2/2 sessions with min-A Baseline: 11/06/23 Goal status: MET  3.  Pt will complete clinical swallow evaluation Baseline:  Goal status: deferred   4.  Pt will average 70+ dB over 8 minute conversation with rare verbal or visual cues  Baseline:  Goal status: PARTIALLY MET   LONG TERM GOALS: Target date: 7/11//2025 (date changed d/t delay in scheduling)   Pt will verbalize HEP plan for ongoing practice following ST d/c  Baseline:  Goal status: ONGOING  2.  Pt will meet or exceed target dB for Speak Out cognitive and conversational exercises up to  in 2/2 sessions with min-A Baseline: 11/19/23 Goal status: ONGOING  3.  Pt will average 70+ dB over 25 minute conversation with rare verbal or visual cues  Baseline:  Goal status: ONGOING  4.  Pt will teach back saliva management strategies with mod-I  Baseline:  Goal status: ONGOING   ASSESSMENT:  CLINICAL IMPRESSION: Patient is a 73 y.o. M who was seen today for motor speech in setting of PD. Conducted ongoing education and training for [employer] program with pt able to demonstrate completion of exercises and carryover intent into conversation with occasional min A. I recommend skilled ST to address dysarthria.  OBJECTIVE IMPAIRMENTS: Objective impairments include dysarthria and voice disorder. These impairments are limiting patient from effectively communicating at home and in community.Factors affecting potential to achieve goals and functional outcome are co-morbidities and previous level of function.. Patient will benefit from skilled SLP services to address above impairments and improve overall function.  REHAB POTENTIAL: Good  PLAN:  SLP FREQUENCY: 2x/week  SLP DURATION: 6 weeks  PLANNED INTERVENTIONS: Aspiration precaution training, Pharyngeal strengthening exercises, Diet toleration  management , Cueing hierachy, Internal/external aids, Functional tasks, SLP instruction and feedback, Compensatory strategies, Patient/family education, [phone] Treatment of speech (30 or 45 min) , and [id number] Treatment of swallowing function   Comer LILLETTE Louder, CCC-SLP 11/27/2023, 11:16 AM

## 2023-11-28 ENCOUNTER — Ambulatory Visit: Attending: Neurology

## 2023-11-28 DIAGNOSIS — R471 Dysarthria and anarthria: Secondary | ICD-10-CM | POA: Diagnosis not present

## 2023-11-28 DIAGNOSIS — R41841 Cognitive communication deficit: Secondary | ICD-10-CM | POA: Diagnosis present

## 2023-11-28 DIAGNOSIS — R1310 Dysphagia, unspecified: Secondary | ICD-10-CM | POA: Insufficient documentation

## 2023-12-13 ENCOUNTER — Telehealth: Payer: Self-pay | Admitting: *Deleted

## 2023-12-13 DIAGNOSIS — M5416 Radiculopathy, lumbar region: Secondary | ICD-10-CM | POA: Diagnosis not present

## 2023-12-13 NOTE — Telephone Encounter (Signed)
   Pre-operative Risk Assessment    Patient Name: Ashtan E Onstott  DOB: [birth year] MRN: [id number]   Date of last office visit: 03/26/23 ORREN FABRY, PAC Date of next office visit: NONE   Request for Surgical Clearance    Procedure:  LEFT L4-L5, L5-S1 TFESI  Date of Surgery:  Clearance TBD                                Surgeon:  DR. DAVE [facility] or Practice Name:  [facility] Phone number:  [phone]; BEECHERBETHA HEDGE Fax number:  [phone]   Type of Clearance Requested:   - Medical  - Pharmacy:  Hold Apixaban  (Eliquis ) x 3 DAYS PRIOR AND RESUME THE DAY AFTER PROCEDURE   Type of Anesthesia:  Not Indicated   Additional requests/questions:    Bonney Niels Jest   12/13/2023, 3:43 PM

## 2023-12-14 NOTE — Telephone Encounter (Signed)
 Pharmacy please advise on holding Eliquis  for 3 days  prior to  LEFT L4-L5, L5-S1 TFESI  scheduled for TBD. Thank you.

## 2023-12-17 NOTE — Telephone Encounter (Signed)
 Patient with diagnosis of afib on Eliquis  for anticoagulation.    Procedure: LEFT L4-L5, L5-S1 TFESI  Date of procedure: TBD   CHA2DS2-VASc Score = 3   This indicates a 3.2% annual risk of stroke. The patient's score is based upon: CHF History: 0 HTN History: 1 Diabetes History: 0 Stroke History: 0 Vascular Disease History: 1 Age Score: 1 Gender Score: 0      CrCl 92 ml/min Platelet count 260  Patient has not had an Afib/aflutter ablation within the last 3 months or DCCV within the last 30 days  Per office protocol, patient can hold Eliquis  for 3 days prior to procedure.    **This guidance is not considered finalized until pre-operative APP has relayed final recommendations.**

## 2023-12-18 ENCOUNTER — Telehealth: Payer: Self-pay

## 2023-12-18 NOTE — Telephone Encounter (Signed)
  Patient Consent for Virtual Visit        Jack Cobb has provided verbal consent on 12/18/2023 for a virtual visit (video or telephone).   CONSENT FOR VIRTUAL VISIT FOR:  Jack Cobb  By participating in this virtual visit I agree to the following:  I hereby voluntarily request, consent and authorize [facility] and its employed or contracted physicians, physician assistants, nurse practitioners or other licensed health care professionals (the Practitioner), to provide me with telemedicine health care services (the "Services) as deemed necessary by the treating Practitioner. I acknowledge and consent to receive the Services by the Practitioner via telemedicine. I understand that the telemedicine visit will involve communicating with the Practitioner through live audiovisual communication technology and the disclosure of certain medical information by electronic transmission. I acknowledge that I have been given the opportunity to request an in-person assessment or other available alternative prior to the telemedicine visit and am voluntarily participating in the telemedicine visit.  I understand that I have the right to withhold or withdraw my consent to the use of telemedicine in the course of my care at any time, without affecting my right to future care or treatment, and that the Practitioner or I may terminate the telemedicine visit at any time. I understand that I have the right to inspect all information obtained and/or recorded in the course of the telemedicine visit and may receive copies of available information for a reasonable fee.  I understand that some of the potential risks of receiving the Services via telemedicine include:  Delay or interruption in medical evaluation due to technological equipment failure or disruption; Information transmitted may not be sufficient (e.g. poor resolution of images) to allow for appropriate medical decision making by the  Practitioner; and/or  In rare instances, security protocols could fail, causing a breach of personal health information.  Furthermore, I acknowledge that it is my responsibility to provide information about my medical history, conditions and care that is complete and accurate to the best of my ability. I acknowledge that Practitioner's advice, recommendations, and/or decision may be based on factors not within their control, such as incomplete or inaccurate data provided by me or distortions of diagnostic images or specimens that may result from electronic transmissions. I understand that the practice of medicine is not an exact science and that Practitioner makes no warranties or guarantees regarding treatment outcomes. I acknowledge that a copy of this consent can be made available to me via my patient portal [facility] MyChart), or I can request a printed copy by calling the office of [facility].    I understand that my insurance will be billed for this visit.   I have read or had this consent read to me. I understand the contents of this consent, which adequately explains the benefits and risks of the Services being provided via telemedicine.  I have been provided ample opportunity to ask questions regarding this consent and the Services and have had my questions answered to my satisfaction. I give my informed consent for the services to be provided through the use of telemedicine in my medical care

## 2023-12-18 NOTE — Telephone Encounter (Signed)
   Name: Jack Cobb  DOB: [birth year]  MRN: [id number]  Primary Cardiologist: Wilbert Bihari, MD   Preoperative team, please contact this patient and set up a phone call appointment for further preoperative risk assessment. Please obtain consent and complete medication review. Thank you for your help.  I confirm that guidance regarding antiplatelet and oral anticoagulation therapy has been completed and, if necessary, noted below.  Per Pharm D, patient has not had an Afib/aflutter ablation within the last 3 months or DCCV within the last 30 days. Patient may hold Eliquis  for 3 days prior to procedure.    I also confirmed the patient resides in the state of [place] . As per [facility] telemedicine laws, the patient must reside in the state in which the provider is licensed.    Barnie Hila, NP 12/18/2023, 11:56 AM [facility]

## 2023-12-18 NOTE — Telephone Encounter (Signed)
 Preop televisit now scheduled, med rec and consent done,

## 2023-12-27 ENCOUNTER — Ambulatory Visit: Admitting: Physical Therapy

## 2023-12-31 ENCOUNTER — Ambulatory Visit: Attending: Internal Medicine

## 2023-12-31 DIAGNOSIS — Z01810 Encounter for preprocedural cardiovascular examination: Secondary | ICD-10-CM

## 2023-12-31 NOTE — Progress Notes (Signed)
 Virtual Visit via Telephone Note   Because of Jack Cobb co-morbid illnesses, he is at least at moderate risk for complications without adequate follow up.  This format is felt to be most appropriate for this patient at this time.  Due to technical limitations with video connection (technology), today's appointment will be conducted as an audio only telehealth visit, and Jack Cobb verbally agreed to proceed in this manner.   All issues noted in this document were discussed and addressed.  No physical exam could be performed with this format.  Evaluation Performed:  Preoperative cardiovascular risk assessment _____________   Date:  12/31/2023   Patient ID:  Jack Cobb, DOB 1950-07-25, MRN [id number] Patient Location:  Home Provider location:   Office  Primary Care Provider:  Montey Lot, PA-C Primary Cardiologist:  Wilbert Bihari, MD  Chief Complaint / Patient Profile  73 y.o. y/o male with a h/o CAD, PAF, hypertension, hyperlipidemia and parkinson's diseas who is pending left L4-L5, L5-S1 TFESI with Dr. Deatrice Manus and presents today for telephonic preoperative cardiovascular risk assessment. History of Present Illness  Jack Cobb is a 73 y.o. male who presents via audio/video conferencing for a telehealth visit today.  Pt was last seen in cardiology clinic on 03/26/23 by Orren Fabry, PA.  At that time Jack Cobb was doing well.  The patient is now pending procedure as outlined above. Since his last visit, he has remained stable from a cardiac standpoint.  Today he denies chest pain, shortness of breath, lower extremity edema, fatigue, palpitations, melena, hematuria, hemoptysis, diaphoresis, weakness, presyncope, syncope, orthopnea, and PND. He is able to achieve greater than 4 METs of activity.  Past Medical History    Past Medical History:  Diagnosis Date   CAD (coronary artery disease), native coronary artery 09/06/2020   Coronary CTA was done showing  a coronary calcium  score of 201 placing him at the 60th percentile for age and gender.  He was noted to have 75 to 99% stenosis of the mid LAD with scattered nonobstructive plaquing elsewhere.  FFR 0.7 in D1 and 0.73 at the apex>>medical management   HLD (hyperlipidemia) 09/06/2020   Hypertension    Parkinson disease [facility])    Past Surgical History:  Procedure Laterality Date   BACK SURGERY     2019.2023, january 2024 lumbar surgeries with dr. pool   HERNIA REPAIR     ventral hernia repair with mesh x2   LUMBAR LAMINECTOMY/DECOMPRESSION MICRODISCECTOMY Left 09/19/2022   Procedure: Microdiscectomy - left - Lumbar Four-Lumbar Five;  Surgeon: Louis Shove, MD;  [facility];  Service: Neurosurgery;  Laterality: Left;  3C   Allergies No Known Allergies Home Medications    Prior to Admission medications   Medication Sig Start Date End Date Taking? Authorizing Provider  carbidopa -levodopa  (SINEMET  IR) 25-100 MG tablet Take 1.5 tablets by mouth 6 (six) times daily. Take 1 pill 6 times daily, about every 3 hours. 04/17/23   Whitfield Raisin, NP  Cholecalciferol  (VITAMIN D3 PO) Take 1,000 Int'l Units by mouth daily.    [provider]  Coenzyme Q10 (CO Q10 PO) Take 1 capsule by mouth daily.    [provider]  diltiazem  (CARDIZEM  CD) 180 MG 24 hr capsule Take 1 capsule (180 mg total) by mouth daily. 07/19/23   Bihari Wilbert SAUNDERS, MD  ELIQUIS  5 MG TABS tablet TAKE 1 TABLET BY MOUTH TWICE DAILY 07/23/23   Bihari Wilbert SAUNDERS, MD  entacapone (COMTAN) 200 MG tablet  Take 200 mg by mouth 3 (three) times daily.    [provider]  gabapentin  (NEURONTIN ) 300 MG capsule Take 300 mg by mouth at bedtime.    [provider]  pramipexole  (MIRAPEX ) 1 MG tablet Take 1 tablet (1 mg total) by mouth 2 (two) times daily. 08/15/23   Buck Saucer, MD  rasagiline  (AZILECT ) 1 MG TABS tablet TAKE 1 TABLET BY MOUTH DAILY Patient not taking: Reported on 12/18/2023 10/26/22   Buck Saucer, MD   rosuvastatin  (CRESTOR ) 10 MG tablet Take 1 tablet (10 mg total) by mouth daily. If 10 mg is not tolerable due to muscle pain and stiffness, try taking 5 mg every day or every other day. Patient not taking: Reported on 12/18/2023 05/25/22   Shlomo Wilbert SAUNDERS, MD  tamsulosin  (FLOMAX ) 0.4 MG CAPS capsule Take 0.4 mg by mouth at bedtime. 01/06/22   [provider]    Physical Exam  Vital Signs:  Jack Cobb does not have vital signs available for review today. Given telephonic nature of communication, physical exam is limited. AAOx3. NAD. Normal affect.  Speech and respirations are unlabored. Accessory Clinical Findings  None Assessment & Plan    1.  Preoperative Cardiovascular Risk Assessment: Mr. Altland perioperative risk of a major cardiac event is 0.4% according to the Revised Cardiac Risk Index (RCRI).  Therefore, he is at low risk for perioperative complications.   His functional capacity is good at 6.36 METs according to the Duke Activity Status Index (DASI). Recommendations: According to ACC/AHA guidelines, no further cardiovascular testing needed.  The patient may proceed to surgery at acceptable risk.   Antiplatelet and/or Anticoagulation Recommendations: Eliquis  (Apixaban ) can be held for 3 days prior to surgery.  Please resume post op when felt to be safe.     The patient was advised that if he develops new symptoms prior to surgery to contact our office to arrange for a follow-up visit, and he verbalized understanding.  A copy of this note will be routed to requesting surgeon.  Time:   Today, I have spent 11 minutes with the patient with telehealth technology discussing medical history, symptoms, and management plan.    Fountain Derusha D Shawneequa Baldridge, NP  12/31/2023, 2:31 PM

## 2024-01-10 ENCOUNTER — Other Ambulatory Visit: Payer: Self-pay | Admitting: Adult Health

## 2024-01-10 ENCOUNTER — Other Ambulatory Visit: Payer: Self-pay

## 2024-01-10 DIAGNOSIS — G20B2 Parkinson's disease with dyskinesia, with fluctuations: Secondary | ICD-10-CM

## 2024-01-10 MED ORDER — PRAMIPEXOLE DIHYDROCHLORIDE 1 MG PO TABS: 1.0000 mg | ORAL_TABLET | Freq: Two times a day (BID) | ORAL | 5 refills | Status: AC

## 2024-01-23 DIAGNOSIS — M5417 Radiculopathy, lumbosacral region: Secondary | ICD-10-CM | POA: Diagnosis not present

## 2024-02-01 DIAGNOSIS — G20B2 Parkinson's disease with dyskinesia, with fluctuations: Secondary | ICD-10-CM | POA: Diagnosis not present

## 2024-02-01 DIAGNOSIS — R1312 Dysphagia, oropharyngeal phase: Secondary | ICD-10-CM | POA: Diagnosis not present

## 2024-02-01 DIAGNOSIS — R2689 Other abnormalities of gait and mobility: Secondary | ICD-10-CM | POA: Diagnosis not present

## 2024-02-04 ENCOUNTER — Other Ambulatory Visit: Payer: Self-pay | Admitting: Cardiology

## 2024-02-04 DIAGNOSIS — I480 Paroxysmal atrial fibrillation: Secondary | ICD-10-CM

## 2024-02-04 NOTE — Telephone Encounter (Signed)
 Eliquis  5mg  refill request received. Patient is 73 years old, weight-72.8kg, Crea-0.74 on 07/20/23 via [facility] from [facility], [place], and last seen by Katlyn West on 12/31/23. Dose is appropriate based on dosing criteria. Will send in refill to requested pharmacy.

## 2024-02-13 DIAGNOSIS — M5416 Radiculopathy, lumbar region: Secondary | ICD-10-CM | POA: Diagnosis not present

## 2024-04-02 ENCOUNTER — Other Ambulatory Visit: Payer: Self-pay | Admitting: Cardiology

## 2024-04-03 ENCOUNTER — Telehealth: Payer: Self-pay | Admitting: Neurology

## 2024-04-03 NOTE — Telephone Encounter (Signed)
 Patient's wife called to cancel patient's appointment due to already seeing a movement specialist.

## 2024-04-14 ENCOUNTER — Ambulatory Visit: Admitting: Adult Health

## 2024-05-13 ENCOUNTER — Ambulatory Visit: Admitting: Speech Pathology

## 2024-05-13 ENCOUNTER — Ambulatory Visit: Admitting: Occupational Therapy

## 2024-05-13 ENCOUNTER — Ambulatory Visit: Admitting: Physical Therapy

## 2024-05-13 DIAGNOSIS — R2681 Unsteadiness on feet: Secondary | ICD-10-CM | POA: Insufficient documentation

## 2024-05-13 DIAGNOSIS — R278 Other lack of coordination: Secondary | ICD-10-CM | POA: Insufficient documentation

## 2024-05-13 NOTE — Therapy (Signed)
 [facility] [address] [place], [place], [address] Phone: [phone]   Fax:  [phone]  Patient Details  Name: Jack Cobb MRN: [id number] Date of Birth: 05-03-1951 Referring Provider:  Montey Lot, PA-C  Encounter Date: 05/13/2024  Physical Therapy Parkinson's Disease Screen   Timed Up and Go test:12.4 seconds with freezing when turning (previously 8.69 seconds)  10 meter walk test:9.8 seconds = 3.35 ft/sec with no AD  5 time sit to stand test: 10 seconds with no UE support   Transitioned his care to the movement disorder neurologist at [facility]. Had a swallow test since he was here and it turned out great. Had some of his PD medication changed recently (Crexont  and is extended release) and pt's spouse reports it feels like it is helping with his speech. No falls. Not doing any formal exercise, just working around the house. Reports his shoulders hurt all the time. Went back to the back doctor and has had 2 injections in his back and reports it is alright. Notes freezing has gotten worse. Has not tried the exercises from he was last here.   Patient does not require Physical Therapy services at this time and pt does not want PT.  Recommend Physical Therapy screen in 6 months.   PT reviewed freezing strategies with pt esp with weight shifting and thinking about big marching when turning. Pt reports he currently has been working on counting on loud. Strongly encouraged pt to perform his HEP given from bout of therapy (pt has handouts), esp with standing PWR moves as they help work on weight shifting and balance.    Sheffield LOISE Senate, PT,DPT 05/13/2024, 1:18 PM  [facility] [address] [place], [place], [address] Phone: [phone]   Fax:  [phone]

## 2024-05-13 NOTE — Therapy (Signed)
 Occupational Therapy Parkinson's Disease Screen  Hand dominance:  Right   Physical Performance Test item #2 (simulated eating):  11 sec  Physical Performance Test item #4 (donning/doffing jacket):  14 sec  Fastening/unfastening 3 buttons in:  36 sec  9-hole peg test:    RUE  30 sec        LUE  33 sec  Box & Blocks Test:   RUE  54 blocks        LUE  56 blocks  Change in ability to perform ADLs/IADLs:  no significant change reported  Other Comments:  pt reports more freezing with walking  Pt does not require occupational therapy services at this time.  No significant change to outcome measures. Encouraged pt to continue with current HEP. Recommended occupational therapy screen in 6 months.

## 2024-07-01 ENCOUNTER — Ambulatory Visit: Admitting: Cardiology

## 2024-11-11 ENCOUNTER — Ambulatory Visit: Admitting: Physical Therapy

## 2024-11-11 ENCOUNTER — Ambulatory Visit: Admitting: Occupational Therapy
